# Patient Record
Sex: Female | Born: 1975
Health system: Southern US, Community
[De-identification: ages and names within clinical notes are randomized; demographics above are authoritative.]

## PROBLEM LIST (undated history)

## (undated) ENCOUNTER — Inpatient Hospital Stay: Payer: Self-pay

## (undated) DIAGNOSIS — Z3490 Encounter for supervision of normal pregnancy, unspecified, unspecified trimester: Secondary | ICD-10-CM

## (undated) DIAGNOSIS — Z8489 Family history of other specified conditions: Secondary | ICD-10-CM

## (undated) DIAGNOSIS — F419 Anxiety disorder, unspecified: Secondary | ICD-10-CM

## (undated) DIAGNOSIS — I81 Portal vein thrombosis: Secondary | ICD-10-CM

## (undated) DIAGNOSIS — E039 Hypothyroidism, unspecified: Secondary | ICD-10-CM

## (undated) DIAGNOSIS — G4730 Sleep apnea, unspecified: Secondary | ICD-10-CM

## (undated) DIAGNOSIS — Z98891 History of uterine scar from previous surgery: Secondary | ICD-10-CM

## (undated) DIAGNOSIS — K219 Gastro-esophageal reflux disease without esophagitis: Secondary | ICD-10-CM

## (undated) DIAGNOSIS — E119 Type 2 diabetes mellitus without complications: Secondary | ICD-10-CM

## (undated) DIAGNOSIS — E282 Polycystic ovarian syndrome: Secondary | ICD-10-CM

## (undated) DIAGNOSIS — L409 Psoriasis, unspecified: Secondary | ICD-10-CM

## (undated) DIAGNOSIS — O99210 Obesity complicating pregnancy, unspecified trimester: Secondary | ICD-10-CM

## (undated) DIAGNOSIS — D6852 Prothrombin gene mutation: Secondary | ICD-10-CM

## (undated) DIAGNOSIS — O34219 Maternal care for unspecified type scar from previous cesarean delivery: Secondary | ICD-10-CM

## (undated) DIAGNOSIS — O1495 Unspecified pre-eclampsia, complicating the puerperium: Secondary | ICD-10-CM

## (undated) DIAGNOSIS — D649 Anemia, unspecified: Secondary | ICD-10-CM

## (undated) HISTORY — PX: DILATION AND CURETTAGE OF UTERUS: SHX78

## (undated) HISTORY — PX: DIAGNOSTIC LAPAROSCOPY: SUR761

## (undated) HISTORY — PX: OTHER SURGICAL HISTORY: SHX169

## (undated) HISTORY — DX: Polycystic ovarian syndrome: E28.2

## (undated) HISTORY — DX: Psoriasis, unspecified: L40.9

## (undated) HISTORY — DX: Type 2 diabetes mellitus without complications: E11.9

## (undated) HISTORY — DX: Gastro-esophageal reflux disease without esophagitis: K21.9

## (undated) HISTORY — PX: LAPAROSCOPIC CHOLECYSTECTOMY: SUR755

---
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
## 2009-07-16 DIAGNOSIS — O1495 Unspecified pre-eclampsia, complicating the puerperium: Secondary | ICD-10-CM

## 2012-09-04 ENCOUNTER — Emergency Department: Payer: Self-pay | Admitting: Emergency Medicine

## 2012-09-04 LAB — URINALYSIS, COMPLETE
Glucose,UR: NEGATIVE mg/dL (ref 0–75)
Ketone: NEGATIVE
Nitrite: NEGATIVE
Protein: 30
RBC,UR: 18 /HPF (ref 0–5)
Specific Gravity: 1.025 (ref 1.003–1.030)
WBC UR: 4 /HPF (ref 0–5)

## 2012-09-04 LAB — CBC
HCT: 39.2 % (ref 35.0–47.0)
MCH: 25.3 pg — ABNORMAL LOW (ref 26.0–34.0)
MCV: 78 fL — ABNORMAL LOW (ref 80–100)
Platelet: 369 10*3/uL (ref 150–440)
RBC: 5.04 10*6/uL (ref 3.80–5.20)
WBC: 10.2 10*3/uL (ref 3.6–11.0)

## 2012-09-04 LAB — PROTIME-INR
INR: 0.9
Prothrombin Time: 12.1 secs (ref 11.5–14.7)

## 2012-09-04 LAB — APTT: Activated PTT: 27.6 secs (ref 23.6–35.9)

## 2012-09-04 LAB — HCG, QUANTITATIVE, PREGNANCY: Beta Hcg, Quant.: 12 m[IU]/mL — ABNORMAL HIGH

## 2012-09-22 ENCOUNTER — Ambulatory Visit: Payer: Self-pay | Admitting: Medical

## 2012-10-10 ENCOUNTER — Encounter: Payer: Self-pay | Admitting: Maternal & Fetal Medicine

## 2012-10-20 DIAGNOSIS — E282 Polycystic ovarian syndrome: Secondary | ICD-10-CM | POA: Insufficient documentation

## 2012-10-20 HISTORY — DX: Polycystic ovarian syndrome: E28.2

## 2012-12-29 ENCOUNTER — Encounter: Payer: Self-pay | Admitting: Obstetrics & Gynecology

## 2013-01-10 ENCOUNTER — Encounter: Payer: Self-pay | Admitting: Obstetrics and Gynecology

## 2013-03-02 ENCOUNTER — Ambulatory Visit: Payer: Self-pay | Admitting: Family Medicine

## 2013-03-13 ENCOUNTER — Ambulatory Visit: Payer: Self-pay | Admitting: Family Medicine

## 2013-04-12 ENCOUNTER — Ambulatory Visit: Payer: Self-pay | Admitting: Family Medicine

## 2013-08-24 ENCOUNTER — Ambulatory Visit: Payer: Self-pay | Admitting: Oncology

## 2013-08-31 ENCOUNTER — Encounter: Payer: Self-pay | Admitting: Maternal and Fetal Medicine

## 2013-09-10 ENCOUNTER — Ambulatory Visit: Payer: Self-pay | Admitting: Oncology

## 2013-09-18 ENCOUNTER — Emergency Department: Payer: Self-pay | Admitting: Emergency Medicine

## 2013-09-18 LAB — CBC
HCT: 36.7 % (ref 35.0–47.0)
HGB: 12.3 g/dL (ref 12.0–16.0)
MCH: 26.6 pg (ref 26.0–34.0)
MCHC: 33.6 g/dL (ref 32.0–36.0)
MCV: 79 fL — ABNORMAL LOW (ref 80–100)
PLATELETS: 294 10*3/uL (ref 150–440)
RBC: 4.63 10*6/uL (ref 3.80–5.20)
RDW: 15.7 % — AB (ref 11.5–14.5)
WBC: 7.9 10*3/uL (ref 3.6–11.0)

## 2013-09-18 LAB — HCG, QUANTITATIVE, PREGNANCY: Beta Hcg, Quant.: 23797 m[IU]/mL — ABNORMAL HIGH

## 2013-09-20 ENCOUNTER — Ambulatory Visit: Payer: Self-pay | Admitting: Obstetrics & Gynecology

## 2013-09-21 ENCOUNTER — Ambulatory Visit: Payer: Self-pay | Admitting: Obstetrics & Gynecology

## 2013-09-22 LAB — PATHOLOGY REPORT

## 2013-10-12 ENCOUNTER — Ambulatory Visit: Payer: Self-pay | Admitting: Chiropractor

## 2014-05-16 LAB — OB RESULTS CONSOLE GC/CHLAMYDIA
Chlamydia: NEGATIVE
Gonorrhea: NEGATIVE

## 2014-05-16 LAB — OB RESULTS CONSOLE ABO/RH: RH Type: NEGATIVE

## 2014-05-16 LAB — OB RESULTS CONSOLE RUBELLA ANTIBODY, IGM: Rubella: IMMUNE

## 2014-05-16 LAB — OB RESULTS CONSOLE HIV ANTIBODY (ROUTINE TESTING): HIV: NONREACTIVE

## 2014-05-16 LAB — OB RESULTS CONSOLE RPR: RPR: NONREACTIVE

## 2014-05-16 LAB — OB RESULTS CONSOLE ANTIBODY SCREEN: Antibody Screen: NEGATIVE

## 2014-05-16 LAB — OB RESULTS CONSOLE HEPATITIS B SURFACE ANTIGEN: HEP B S AG: NEGATIVE

## 2014-05-16 LAB — OB RESULTS CONSOLE VARICELLA ZOSTER ANTIBODY, IGG: Varicella: IMMUNE

## 2014-05-24 ENCOUNTER — Encounter: Payer: Self-pay | Admitting: Obstetrics and Gynecology

## 2014-07-09 ENCOUNTER — Encounter: Payer: Self-pay | Admitting: Obstetrics and Gynecology

## 2014-07-13 HISTORY — DX: Maternal care for unspecified type scar from previous cesarean delivery: O34.219

## 2014-09-04 ENCOUNTER — Observation Stay: Payer: Self-pay | Admitting: Obstetrics & Gynecology

## 2014-11-02 NOTE — Consult Note (Signed)
Referral Information:  Reason for Referral Preconception Counseling. History of portal vein thrombsis in setting of Prothrombin Gene mutation carrier   Referring Physician [facility]   Prenatal Hx Joanna BucklerHeidi is a 39 year-old G2 P1011 who is here for a preconceptual counselign visit. In 2006 she was diagnosed with a portal vein thrombosis. At the time she was on continuous OCPs and also smoking. She was placed on heparin and then completed 8 months of coumadin therapy.  She underwent a workup for inherited and acquired thrombophilias and was found to be a carrier for the prothrombin gene mutation. All other testing was normal.  She became pregnant in 2011 and was maintained on prophylactic doing of Lovenox (60 mg once daily) and then transition to unfractionated heparin (10,000 units every 12 hours). At 38 weeks during routine fetal testing she was found to have oligohyramnios. She underwent an induction of labor which was complicated by non-reassuring fetal status that required a cesareand delivery. She then was placed on Lovenox for six week postpartum. She was readmitted one week postpartum with preeclampsia and stayed in the hosptial 3 days.  She never had any bleeding complications and did well. She was living in [place] and was followed by the [facility], [place].  [facility]. She has PCOS and is planning for clomid induction.  She has no complaints.   Past Obstetrical Hx G3 P1011 2011: IOL at 38 weeks for oligo. cesarean for non-reassuring fetal status. Female fetus, 6 pounds 3 ounses. On propulactic anticoagulation in the pregnancy (see HPI). Readmitted postpartum with preeclampsai 2014: SAB at 5 weeks. No cardiac activity. No D&C required   Home Medications: Medication Instructions Status  Protonix 40 mg oral delayed release tablet 1 tab(s) orally once a day Active  Prenatal 1 Prenatal Multivitamins with Folic  Acid 0.975 mg oral capsule 1 cap(s) orally once a day Active   Allergies:   No Known Allergies:   Vital Signs/Notes:  Nursing Vital Signs: **Vital Signs.:   19-Jun-14 13:15  Pulse Pulse 83  Systolic BP Systolic BP 96  Diastolic BP (mmHg) Diastolic BP (mmHg) 82   Perinatal Consult:  Past Medical History cont'd Carrier of the Prothrombin gene mutation. States remainder of thrombophilia workup was negative Portal vein thrombosis in 2006 PCOS   PSurg Hx Cesarean delivery 2011. Diagnostic laparscopy in 2001 for pelvic pain. Laparscopic cholecystectomy   FHx Denies a FH of birth defects, genetic disorders, or mental retardation. Maternal GM with DVT at age 39. 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   Fever/Chills No   Abdominal Pain No   Nausea/Vomiting No   SOB/DOE No   Chest Pain No   Tolerating Diet Yes   Medications/Allergies Reviewed Medications/Allergies reviewed  as above    Additional Lab/Radiology Notes Outside labs [facility] 10/07/09: ATIII activity 106%, Protein S activity 122%,  10/07/09: Lupus anticoagulant negative,  10/07/09: Homocystein level normal (7.7)  [facility] Progress Notes: State heterozygote carrier of prothrombin gene mutation.  States negative Factor V Leiden carrier and Normal Protein C and S levels.   Impression/Recommendations:  Impression 39 year-old G2 P1011 here for preconception counseling for history of portal vein thrombosis and heterozygote carrier for the prothrombin gene mutation. She has had  a successful pregnancy on prophylactic dosing of Lovenox.   Recommendations 1. Advanced Maternal Age. We discussed age-associated risk for aneuploidy and availability of genetic counseling and aneuploidy screening and  diagnostic tests that are available. We also discussed risks of medical complications of pregnancy.  2. History of prior cesarean delivery.  We discussed risks and benefits of both a trial of labor and repeat cesarean. She will further discuss with [facility]   3. Prothrombin gene mutation (heterozygote carrier) with history of portal vein thrombosis. We discussed risks of recurrent thromboembolic phenomena during pregnancy. She has done well in a previous pregnancy on prophylactic anticoagulation. We discussed risks and benefits of anticoagulation in pregnancy. We repeated antiphospholipid antibody testing today as these can chage. If negative, will recommned prophylactic dosing of Lovenox in pregnancy (see below). If positive, she will need full-dose anticoagulation.  Rec: -Referral to genetic counseling for Advanced Maternal Age once pregnatnt -Initiate Lovenox 30 mg daily when becomes pregnant, increasging to 30 mg every 12 hours with documentation of live intrauterine pregnancy. Increase to 40 mg every 12 hours at 28 weeks. At 36 weeks, transition to unfractionalted heparin 10,000 units every 12 hours. Deliver at 39 weeks, having held heparin the nigth prior and morning of delivery -SCDs on in labor or during cesarean -Fetal surveiallance: Detailed ultrasound at approx 18 weeks then ultraosund for growth at approximately 28 and 34 weeks. Twice weekly fetal testing starting at 32 weeks -Continue prenatal vitamins -We discussed use of daily baby aspirin for preeclampsia prevention starting at 12 weeks.    Total Time Spent with Patient 60 minutes   >50% of visit spent in couseling/coordination of care yes   Office Use Only 99244  Level 4 ( ) NEW office consult low complexity   Coding Description: MATERNAL CONDITIONS/HISTORY INDICATION(S).   Adv. Maternal Age - multigravida.   Previous C-section.   Primary hypercoagulable state.  Electronic Signatures: Ravenna Legore, [place] (MD)   (Signed 19-Jun-14 15:58)  Authored: Referral, Home Medications, Allergies, Vital Signs/Notes, Consult, Exam, Lab/Radiology Notes, Impression, Billing, Coding Description   Last Updated: 19-Jun-14 15:58 by Moreen Piggott, [place] (MD)

## 2014-11-03 NOTE — Op Note (Signed)
PATIENT NAME:  Joanna Scott, Joanna Scott MR#:  [id number] DATE OF BIRTH:  1976/04/18  DATE OF PROCEDURE:  09/21/2013  PREOPERATIVE DIAGNOSIS: Missed abortion.  POSTOPERATIVE DIAGNOSIS: Missed abortion.   PROCEDURE: Suction dilatation and curettage.   SURGEON: Annamarie MajorPaul Harris, MD   ANESTHESIA: General.   ESTIMATED BLOOD LOSS: Minimal.   COMPLICATIONS: None.   FINDINGS/SPECIMEN: Products of conception.   DISPOSITION: To recovery room in stable condition.   TECHNIQUE: The patient is prepped and draped in the usual sterile fashion. After adequate anesthesia is obtained in the dorsal lithotomy, the bladder was entered with a Robinson catheter. Speculum was placed and the anterior lip of the cervix was grasped with a tenaculum and the cervix was dilated to about a 20 Pratt dilator. Gentle curettage was performed with a banjo curette with tissue sent for genetic studies. A suction curettage was then performed with 8 mm rigid curette with aspiration of products of conception and sent to pathology for further review. Repeat gentle curettage was performed. Excellent hemostasis was noted. The patient goes to recovery in stable condition. All sponge, instrument and needle counts were correct.   ____________________________ R. Annamarie MajorPaul Harris, MD rph:aw D: 09/21/2013 09:48:32 ET T: 09/21/2013 10:09:50 ET JOB#: [id number]  cc: Dierdre Searles. Paul Harris, MD, <Dictator> Nadara MustardOBERT P HARRIS MD ELECTRONICALLY SIGNED 09/21/2013 20:19

## 2014-11-03 NOTE — Consult Note (Signed)
Referral Information:  Reason for Referral History of portal vein thrombosis now pregnant Patient saw Dr. Leone BrandGrotegut for a prepregnancy consult on 12/28/2012 - please see that note for complete history and care plan   Referring Physician [facility]   Prenatal Hx LMP 07/12/2013 (should be 7 1/7 weeks today), but she has irregular cycles. Patient has had spotting. For this she had an ultrasound today that showed a gestational sac only. She has a follow up ultrasound scheduled for 2 weeks   Past Obstetrical Hx Cesarean delivery 2011. Patient was on enoxaparin 60 mg daily   Home Medications: Medication Instructions Status  enoxaparin 40 mg/0.4 mL injectable solution 40 milligram(s) injectable once a day Active  Protonix 40 mg oral delayed release tablet 1 tab(s) orally once a day Active  Prenatal 1 Prenatal Multivitamins with Folic Acid 0.975 mg oral capsule 1 cap(s) orally once a day Active  Zoloft 50 mg oral tablet 1 tab(s) orally once a day Active   Allergies:   No Known Allergies:   Vital Signs/Notes:  Nursing Vital Signs: **Vital Signs.:   19-Feb-15 13:23  Vital Signs Type Admission  Temperature Temperature (F) 99.1  Celsius 37.2  Temperature Source oral  Pulse Pulse 92  Respirations Respirations 18  Systolic BP Systolic BP 125  Diastolic BP (mmHg) Diastolic BP (mmHg) 85  Mean BP 98  Pulse Ox % Pulse Ox % 97  Pulse Ox Activity Level  At rest  Oxygen Delivery Room Air/ 21 %   Perinatal Consult:  Past Medical History cont'd Carrier of the Prothrombin gene mutation. Work up for other inherited thrombophilias -- negative. Work up for aquired thrombophilias -- negative. Portal vein thrombosis in 2006 PCOS   PSurg Hx Cesarean delivery 2011. Diagnostic laparscopy in 2001 for pelvic pain. Laparscopic cholecystectomy   FHx Denies a FH of birth defects, genetic disorders, or mental retardation. Maternal GM with DVT at age 39. 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 [employer]   Soc Hx married, Denies ETOH, tobaccor or drugs   Impression/Recommendations:  Impression 301) 39 year-old G3 P1011 2) History of portal vein thromobosis (on OCPs) 3) Protrhombin gene mutation heterozygote 4) Previous successful pregnancy on mid dosing enoxaparin 5) Currently using enoxaparin 40 mg daily 6) Older maternal age   Recommendations 1) Increase enoxaparin to 40 mg twice daily 2) Agree with follow up ultrasound 3) Assuming ultrasound shows viable pregnancy, start low dose aspirin daily 4) Return in 5 weeks for   a) follow up MFM visit   b) Genetic counseling for AMA   c) Possible NT screening    Total Time Spent with Patient 15 minutes   >50% of visit spent in couseling/coordination of care yes   Office Use Only 99244  Level 4 (60min) NEW office consult low complexity, 99213  Office Visit Level 3 (15min) EST exp prob focused outpt   Coding Description: MATERNAL CONDITIONS/HISTORY INDICATION(S).   Adv. Maternal Age - multigravida.   Previous C-section.   Primary hypercoagulable state.  Electronic Signatures: Marcelino ScotBrancazio, Bret Vanessen (MD)  (Signed 19-Feb-15 14:29)  Authored: Referral, Home Medications, Allergies, Vital Signs/Notes, Consult, Impression, Billing, Coding Description   Last Updated: 19-Feb-15 14:29 by Marcelino ScotBrancazio, Symon Norwood (MD)

## 2014-11-03 NOTE — Consult Note (Signed)
Referral Information:  Reason for Referral [name] is a 39yo [id number] at 4777w2d with an EDC of 01/02/2015 based on a 1646w0d [facility].  She has a history of portal vein thrombosis in 2006 and was subsequently diagnosed with prothrombin gene mutation (heterozygote).  She presents for recommendations regarding management in this pregnancy. Patient saw Dr. Leone Scott for a prepregnancy consult on 12/28/2012 - please see that note for complete history and care plan   Referring Physician [facility]   Prenatal Hx Joanna Scott is currently on 40mg  BID of Lovenox.  She was on 60mg  daily during her first pregnancy; since then she has had follow up antiphospholipid testing which was negative and [facility] and was recommended to be on 40mg  BID.   Past Obstetrical Hx 1.  Cesarean delivery 2011 after IOL for oligohydramnios (female, 6#3oz). Patient was on enoxaparin 60 mg daily.  Readmitted for postpartum preeclampsia.  Remained on daily Lovenox for about 6 weeks PP. 2.  08/2012 chemical pregnancy 3.  08/2013 9 week loss; D&C; diagnosed Trisomy 16 4.  03/2014 chemical pregnancy 5.  current   Home Medications:  Medication Instructions Status  enoxaparin 40 mg/0.4 mL injectable solution 40 milligram(s) injectable 2 times a day Active  Prenatal Multivitamins Prenatal Multivitamins oral tablet 1 tab(s) orally once a day Active  folic acid 1 mg oral tablet 1 tab(s) orally once a day Active  Protonix 40 mg oral delayed release tablet 1 tab(s) orally once a day Active  Zoloft 50 mg oral tablet 1 tab(s) orally once a day Active   Allergies:   No Known Allergies:   Vital Signs/Notes:  Nursing Vital Signs:  **Vital Signs.:   12-Nov-15 13:21  Vital Signs Type Routine  Temperature Temperature (F) 98.4  Celsius 36.8  Pulse Pulse 82  Respirations Respirations 18  Systolic BP Systolic BP 126  Diastolic BP (mmHg) Diastolic BP (mmHg) 76  Mean BP 92  Pulse Ox  % Pulse Ox % 99  Pulse Ox Activity Level  At rest  Oxygen Delivery Room Air/ 21 %   Perinatal Consult:  PGyn Hx History of PCOS/irregular periods; denies abnormal PAPs or STDs    PMed Hx Hx of varicella   Past Medical History cont'd 1.  Portal vein thrombosis in 2006 while on OCPs - presented with severe abdominal pain and was ultimately diagnosed after CT performed [facility]) 2.  Carrier of the Prothrombin gene mutation. Work up for other inherited thrombophilias -- negative. Work up for acquired thrombophilias -- negative  3.  PCOS   PSurg Hx Cesarean delivery 2011. Diagnostic laparoscopy in 2001 for pelvic pain. Laparoscopic cholecystectomy.   FHx Denies a FH of birth defects, genetic disorders, or mental retardation. Maternal GM with DVT at age 39. PGM with stroke in his 2460's.   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 1) History of portal vein thromobosis (on OCPs) 2) Prothrombin gene mutation heterozygote 3) Previous successful  pregnancy on mid dosing enoxaparin 4) Currently using enoxaparin 40 mg BID 5) Older maternal age 44) History of preeclampsia postpartum 7) PCOS/elevated BMI   Recommendations 1) Continue Lovenox 40 mg twice daily - no dose adjustment required.  Recommend continuing this until 24hr prior to repeat c/s or scheduled IOL.  Resume Lovenox  daily PP either 12hrs after vaginal delivery or 24hrs post cesarean delivery.  SCDs while immobile.  Joanna Scott is strongly considering a repeat c/s given the urgency of her last  delivery and wanting to be able to plan this one more. 2) Offered genetic counseling -planning on having cffDNA drawn at the office and first trimester [place]. 3) Start low dose ASA after 12 weeks.  Consider baseline CMP and p/c ratio.  4) Consider early glucola given elevated BMI and history of PCOS. 5) Offer detailed ultrasound at 18 weeks and montly after 28 weeks with antenatal testing to start between 34-36 weeks. Delivery around 39 weeks. 6) We would be more than happy to see Joanna Scott back at any time during the pregnancy.   Plan:  Ultrasound at what gestational ages Monthly > 28 weeks   Antepartum Testing Starting at 57 to 36 weeks    Total Time Spent with Patient 30 minutes   >50% of visit spent in couseling/coordination of care yes   Office Use Only 99243  Level 3 ( ) NEW office consult detailed   Coding Description: MATERNAL CONDITIONS/HISTORY INDICATION(S).   Adv. Maternal Age - multigravida.   Coagulation defects and/or Pt on heparin/coumadin/lovenox.  Electronic Signatures: Kirby Funk (MD)  (Signed [phone] 14:29)  Authored: Referral, Home Medications, Allergies, Vital Signs/Notes, Consult, Exam, Impression, Plan, Billing, Coding Description   Last Updated: 12-Nov-15 14:29 by Kirby Funk (MD)

## 2014-11-20 NOTE — H&P (Signed)
L&D Evaluation:  History:  HPI 38yo [id number] @ 22.6 by 1st trimester US with an EDC of 01/02/15 presents after a fall.  She was holding her son, and tripped over another child and acrobatically fell without hitting her abdomen.  Her injuries are to her hip, shoulder, and knee.  She does feel some abdominal pain, however describes it as external/MSK.  No LOF, VB, CTX +FM  Pregnancy issues: Prothrombin gene mutation, on lovenox and 81mg  ASA AMA anxiety, on zoloft 50mg  history of primary C/S, cosidering VBAC Rh neg Obese, BMI 38  Oneg / Ab neg / RPR NR / HIV neg / Hep B neg / VI / RI   Presents with s/p fall   Patient's Medical History Obesity, Prothrombin gene mutation, anxiety   Patient's Surgical History dx lap, lap chole, c/s   Medications Pre Natal Vitamins  lovenox 40mg  BID, Protonix   Allergies NKDA   Social History none   Family History strokes in family members   ROS:  ROS All systems were reviewed.  HEENT, CNS, GI, GU, Respiratory, CV, Renal and Musculoskeletal systems were found to be normal.   Exam:  Vital Signs stable   Urine Protein not completed   General no apparent distress   Mental Status clear   Chest clear   Heart normal sinus rhythm   Abdomen gravid, non-tender   Estimated Fetal Weight Average for gestational age   Fetal Position cephalic via ultrasound   Back no CVAT   Edema no edema   Reflexes 2+   Clonus negative   Mebranes Intact   FHT Description present   Ucx absent   Impression:  Impression s/p fall   Plan:  Comments 38yo [id number] @ 22.6 s/p fall without trauma to abdomen.  1. ultrasound performed with no evidence of placental disruption 2. fetal hearts present 3. encouraged tylenol, ice and rest to areas where there are discomfort, call if worsening pain 4. discharge home, keep next appointment.   Follow Up Appointment already scheduled   Electronic Signatures: Ward, Elenora Fenderhelsea C (MD)  (Signed 23-Feb-16  13:09)  Authored: L&D Evaluation   Last Updated: 23-Feb-16 13:09 by Ward, Elenora Fenderhelsea C (MD)

## 2014-12-16 ENCOUNTER — Observation Stay
Admission: EM | Admit: 2014-12-16 | Discharge: 2014-12-16 | Disposition: A | Discharge status: Home / Self Care | Payer: BLUE CROSS/BLUE SHIELD | Source: Home / Self Care | Admitting: Obstetrics & Gynecology

## 2014-12-16 DIAGNOSIS — O34219 Maternal care for unspecified type scar from previous cesarean delivery: Secondary | ICD-10-CM

## 2014-12-16 DIAGNOSIS — F419 Anxiety disorder, unspecified: Secondary | ICD-10-CM | POA: Insufficient documentation

## 2014-12-16 DIAGNOSIS — O3421 Maternal care for scar from previous cesarean delivery: Secondary | ICD-10-CM | POA: Insufficient documentation

## 2014-12-16 DIAGNOSIS — O99213 Obesity complicating pregnancy, third trimester: Secondary | ICD-10-CM

## 2014-12-16 DIAGNOSIS — Z6838 Body mass index (BMI) 38.0-38.9, adult: Secondary | ICD-10-CM | POA: Insufficient documentation

## 2014-12-16 DIAGNOSIS — O09523 Supervision of elderly multigravida, third trimester: Secondary | ICD-10-CM

## 2014-12-16 DIAGNOSIS — Z3A37 37 weeks gestation of pregnancy: Secondary | ICD-10-CM | POA: Insufficient documentation

## 2014-12-16 DIAGNOSIS — O99013 Anemia complicating pregnancy, third trimester: Secondary | ICD-10-CM | POA: Insufficient documentation

## 2014-12-16 DIAGNOSIS — Z3490 Encounter for supervision of normal pregnancy, unspecified, unspecified trimester: Secondary | ICD-10-CM

## 2014-12-16 DIAGNOSIS — O99820 Streptococcus B carrier state complicating pregnancy: Secondary | ICD-10-CM

## 2014-12-16 DIAGNOSIS — O26893 Other specified pregnancy related conditions, third trimester: Secondary | ICD-10-CM

## 2014-12-16 HISTORY — DX: Portal vein thrombosis: I81

## 2014-12-16 HISTORY — DX: Encounter for supervision of normal pregnancy, unspecified, unspecified trimester: Z34.90

## 2014-12-16 HISTORY — DX: Unspecified pre-eclampsia, complicating the puerperium: O14.95

## 2014-12-16 HISTORY — DX: Prothrombin gene mutation: D68.52

## 2014-12-16 HISTORY — DX: Obesity complicating pregnancy, unspecified trimester: O99.210

## 2014-12-16 HISTORY — DX: History of uterine scar from previous surgery: Z98.891

## 2014-12-16 HISTORY — DX: Anxiety disorder, unspecified: F41.9

## 2014-12-16 HISTORY — DX: Family history of other specified conditions: Z84.89

## 2014-12-16 HISTORY — DX: Anemia, unspecified: D64.9

## 2014-12-16 LAB — CBC
HCT: 28 % — ABNORMAL LOW (ref 35.0–47.0)
Hemoglobin: 8.7 g/dL — ABNORMAL LOW (ref 12.0–16.0)
MCH: 22.6 pg — ABNORMAL LOW (ref 26.0–34.0)
MCHC: 31.1 g/dL — ABNORMAL LOW (ref 32.0–36.0)
MCV: 72.7 fL — ABNORMAL LOW (ref 80.0–100.0)
PLATELETS: 160 10*3/uL (ref 150–440)
RBC: 3.86 MIL/uL (ref 3.80–5.20)
RDW: 18.1 % — ABNORMAL HIGH (ref 11.5–14.5)
WBC: 11.8 10*3/uL — AB (ref 3.6–11.0)

## 2014-12-16 LAB — APTT: aPTT: 25 seconds (ref 24–36)

## 2014-12-16 LAB — PROTIME-INR
INR: 0.89
Prothrombin Time: 12.2 seconds (ref 11.4–15.0)

## 2014-12-16 LAB — ABO/RH: ABO/RH(D): O NEG

## 2014-12-16 MED ORDER — LACTATED RINGERS IV SOLN
INTRAVENOUS | Status: DC
  Administered 2014-12-16: 999 mL/h via INTRAVENOUS
  Administered 2014-12-16: 07:00:00 via INTRAVENOUS

## 2014-12-16 MED ORDER — LACTATED RINGERS IV SOLN: 500.0000 mL | INTRAVENOUS | Status: DC | PRN

## 2014-12-16 MED ORDER — SODIUM CHLORIDE 0.9 % IJ SOLN
INTRAMUSCULAR | Status: AC
  Administered 2014-12-16: 05:00:00
  Filled 2014-12-16: qty 3

## 2014-12-16 NOTE — Progress Notes (Signed)
S: Still feeling contractions, but they have spaced out with IV hydration. Some difficulty getting IV access. O: BP 129/85, 118/74 FHR 135-140 with accels, mod variability, no decelerations Toco: irregular contractions Cx: FT/60%/-1 to -2 Recent Results (from the past 2160 hour(s))  APTT     Status: None   Collection Time: 12/16/14  5:41 AM  Result Value Ref Range   aPTT 25 24 - 36 seconds  Protime-INR     Status: None   Collection Time: 12/16/14  5:41 AM  Result Value Ref Range   Prothrombin Time 12.2 11.4 - 15.0 seconds   INR 0.89   Type and screen     Status: None   Collection Time: 12/16/14  5:41 AM  Result Value Ref Range   ABO/RH(D) O NEG    Antibody Screen NEG    Sample Expiration 12/19/2014   CBC     Status: Abnormal   Collection Time: 12/16/14  5:41 AM  Result Value Ref Range   WBC 11.8 (H) 3.6 - 11.0 K/uL   RBC 3.86 3.80 - 5.20 MIL/uL   Hemoglobin 8.7 (L) 12.0 - 16.0 g/dL   HCT 09.828.0 (L) 11.935.0 - 14.747.0 %   MCV 72.7 (L) 80.0 - 100.0 fL   MCH 22.6 (L) 26.0 - 34.0 pg   MCHC 31.1 (L) 32.0 - 36.0 g/dL   RDW 82.918.1 (H) 56.211.5 - 13.014.5 %   Platelets 160 150 - 440 K/uL  ABO/Rh     Status: None   Collection Time: 12/16/14  5:41 AM  Result Value Ref Range   ABO/RH(D) O NEG    A: some mild cx change  Anemic P: Called report to Dr Elesa MassedWard, who will be in to evaluate patient. Keep NPO for now Continue to monitor for contractions and  cx change. Farrel ConnersGUTIERREZ, Yosselyn Tax, CNM

## 2014-12-16 NOTE — OB Triage Provider Note (Addendum)
Triage Evaluation  Patient is a 38yo [id number] @ 37.4 with history of cesarean for failed induction.  She presents to triage with complaints of contractions  No LOF VB +FM  Initial evaluation: Cervix long, closed, high  Final evalation:  Cervix 1.5cm, long, high  FHT: 135 mod +accels no decels Toco: irregular   A/P: 38yo G5P1031 @ 37.4 with early labor 1. Patient's contractions eased after administration of IV fluids, and was able to rest.  She is in early labor, and likely will need cesarean soon but is able to wait some time before this declaration.  She is able to go home and get her things in order, including arranging child care for when she is in labor.  Signs to look for were discussed in detail.  Patient to return when contractions are persistent.   2. H/o preeclampsia: BP WNL. 3. Thrombosis risk: restart Lovenox this morning and hold if contractions get progressively worse or more frequent.   Ranae Plumberhelsea Luretta Everly, MD Attending Obstetrician and Gynecologist [facility]

## 2014-12-16 NOTE — OB Triage Provider Note (Signed)
OB History & Physical   History of Present Illness:  Chief Complaint: Contractions off and on yesterday, becoming stronger and every 6-8 minutes at 2330 last night. HPI:  Joanna Scott is a 39 y.o. [id number] female at 4137 4/7 weeks dated by a 7 week ultrasound.  Her pregnancy has been complicated by AMA, anxiety (on Zoloft 50 mgm daily), BMI=38, a prothrombin gene mutation (Factor II) and is on Lovenox 40 mgm prophyllaxis, and a hx of a previous Cesarean section for FTP..  She presents to L&D for evaluation of contractions. She currently is scheduled for a repeat CS on 6/16. She held her PM dose of Lovenox because of her contractions (LD was 0900 6/4). She last ate solid food at 1800 and has been drinking only water since then. She also reports having a blood tinged mucoid discharge. She reports good fetal movement.   Prenatal care site: [facility] with [facility] has also been remarkable for receiving Rhogam at 28 weeks (O neg), a normal Informaseq and anatomy scan, an 8# weight gain with pregnancy, anemia (hmg=10gm/dl), and  normal antepartum testing. Last growth scan at 34.5 weeks with EFW=6#12oz (80.9%) and last AFI 5/31=7.49cm.       Maternal Medical History:   Past Medical History  Diagnosis Date  . Family history of malignant hyperthermia   . Anemia   . Anxiety   . Preeclampsia in postpartum period   . Portal vein thrombosis   . Prothrombin gene mutation   . Obesity affecting pregnancy   . Pregnancy   . Previous cesarean section     Past Surgical History  Procedure Laterality Date  . Cesarean section    . Diagnostic laparoscopy    . Laparoscopic cholecystectomy    . Dilation and curettage of uterus      No Known Allergies  Prior to Admission medications   Medication Sig Start Date End Date Taking? Authorizing Provider  enoxaparin (LOVENOX) 40 MG/0.4ML injection Inject 40 mg into the skin every 12 (twelve) hours. 12/15/14 12/16/14 Yes Historical  Provider, MD  ferrous sulfate 325 (65 FE) MG tablet Take 325 mg by mouth daily with breakfast.   Yes Historical Provider, MD  folic acid (FOLVITE) 1 MG tablet Take 1 mg by mouth daily.   Yes Historical Provider, MD  pantoprazole (PROTONIX) 40 MG tablet Take 40 mg by mouth daily.   Yes Historical Provider, MD  Prenatal Vit-Fe Fumarate-FA (PRENATAL MULTIVITAMIN) TABS tablet Take 1 tablet by mouth daily at 12 noon.   Yes Historical Provider, MD  sertraline (ZOLOFT) 50 MG tablet Take 50 mg by mouth daily.   Yes Historical Provider, MD          Social History: She  reports that she has never smoked. She has never used smokeless tobacco. She reports that she does not drink alcohol or use illicit drugs.  Family History: family history includes Deep vein thrombosis in her maternal grandmother; Malignant hyperthermia in her cousin; Stroke in her paternal grandmother.   Review of Systems: Negative x 10 systems reviewed except as noted in the HPI.      Physical Exam:  Vital Signs: Ht 5\' 3"  (1.6 m)  Wt 102.059 kg (225 lb)  BMI 39.87 kg/m2 142/83 General: no acute distress.  HEENT: normocephalic, atraumatic Heart: regular rate & rhythm.  No murmurs/rubs/gallops Lungs: clear to auscultation bilaterally Abdomen: soft, gravid, non-tender;  EFW: 7 1/2# Pelvic:   External: Normal external female genitalia  Cervix: closed/50-75%/-2/vtx on  Leopold's, small amt blood tinged mucus on glove  Wet Mount:   ROM:  Extremities: non-tender, symmetric, trace edema bilaterally.  DTRs: +1  Neurologic: Alert & oriented x 3.    Pertinent Results:  Prenatal Labs: Blood type/Rh O negative  Antibody screen negative  Rubella immune  RPR nonreactive  HBsAg negative  HIV negative  GC negative  Chlamydia negative  Genetic screening Negative Informaseq  1 hour GTT 115  3 hour GTT NA  GBS positive   Baseline FHR: 130s baseline   Variability: moderate variability    Accelerations: present to 150s     Decelerations: absent Contractions: present frequency: q4-12min apart Overall assessment: Reactive strip- Cat1   Bedside Ultrasound:  Number of Fetus:   Presentation:   Fluid:   Placental Location:   Assessment:  Joanna Scott is a 39 y.o. [id number] female at 37.4 weeks with contractions. R/O labor Previous Cesarean section On Lovenox for prothrombin gene mutation (last dose 9AM 6/4)  Plan:  1. Admit for observation to Labor & Delivery  2. CBC, T&S, RPR, NPO,  IVF 3. GBS positive-will hold antibiotics for now.   4. Monitor for contractions and ervical change 5. Will notify Dr Elesa Massed of admission and any changes  Joanna Scott  12/16/2014 4:10 AM

## 2014-12-16 NOTE — Progress Notes (Signed)
Pt discharged home.  Discharge instructions, follow up appoinments and when to return to [facility] given to and reviewed with pt.  Pt verbalized understanding. Ambulated out with Husband.

## 2014-12-16 NOTE — OB Triage Note (Signed)
C/O contractions since 1200 midnight.  + Spotting and lose of mucous plug.   Denies ROM.    + FM

## 2014-12-16 NOTE — Discharge Instructions (Signed)
Cesarean Delivery  Cesarean delivery is the birth of a baby through a cut (incision) in the abdomen and womb (uterus).  LET YOUR HEALTH CARE PROVIDER KNOW ABOUT:  All medicines you are taking, including vitamins, herbs, eye drops, creams, and over-the-counter medicines.  Previous problems you or members of your family have had with the use of anesthetics.  Any blood disorders you have.  Previous surgeries you have had.  Medical conditions you have.  Any allergies you have.  Complicationsinvolving the pregnancy. RISKS AND COMPLICATIONS  Generally, this is a safe procedure. However, as with any procedure, complications can occur. Possible complications include:  Bleeding.  Infection.  Blood clots.  Injury to surrounding organs.  Problems with anesthesia.  Injury to the baby. BEFORE THE PROCEDURE   You may be given an antacid medicine to drink. This will prevent acid contents in your stomach from going into your lungs if you vomit during the surgery.  You may be given an antibiotic medicine to prevent infection. PROCEDURE   Hair may be removed from your pubic area and your lower abdomen. This is to prevent infection in the incision site.  A tube (Foley catheter) will be placed in your bladder to drain your urine from your bladder into a bag. This keeps your bladder empty during surgery.  An IV tube will be placed in your vein.  You may be given medicine to numb the lower half of your body (regional anesthetic). If you were in labor, you may have already had an epidural in place which can be used in both labor and cesarean delivery. You may possibly be given medicine to make you sleep (general anesthetic) though this is not as common.  An incision will be made in your abdomen that extends to your uterus. There are 2 basic kinds of incisions:  The horizontal (transverse) incision. Horizontal incisions are from side to side and are used for most routine cesarean  deliveries.  The vertical incision. The vertical incision is from the top of the abdomen to the bottom and is less commonly used. It is often done for women who have a serious complication (extreme prematurity) or under emergency situations.  The horizontal and vertical incisions may both be used at the same time. However, this is very uncommon.  An incision is then made in your uterus to deliver the baby.  Your baby will then be delivered.  Both incisions are then closed with absorbable stitches. AFTER THE PROCEDURE   If you were awake during the surgery, you will see your baby right away. If you were asleep, you will see your baby as soon as you are awake.  You may breastfeed your baby after surgery.  You may be able to get up and walk the same day as the surgery. If you need to stay in bed for a period of time, you will receive help to turn, cough, and take deep breaths after surgery. This helps prevent lung problems such as pneumonia.  Do not get out of bed alone the first time after surgery. You will need help getting out of bed until you are able to do this by yourself.  You may be able to shower the day after your cesarean delivery. After the bandage (dressing) is taken off the incision site, a nurse will assist you to shower if you would like help.  You will have pneumatic compression hose placed on your lower legs. This is done to prevent blood clots. When you are up   and walking regularly, they will no longer be necessary.  Do not cross your legs when you sit.  Save any blood clots that you pass. If you pass a clot while on the toilet, do not flush it. Call for the nurse. Tell the nurse if you think you are bleeding too much or passing too many clots.  You will be given medicine as needed. Let your health care providers know if you are hurting. You may also be given an antibiotic to prevent an infection.  Your IV tube will be taken out when you are drinking a reasonable  amount of fluids. The Foley catheter is taken out when you are up and walking.  If your blood type is Rh negative and your baby's blood type is Rh positive, you will be given a shot of anti-D immune globulin. This shot prevents you from having Rh problems with a future pregnancy. You should get the shot even if you had your tubes tied (tubal ligation).  If you are allowed to take the baby for a walk, place the baby in the bassinet and push it. Do not carry your baby in your arms. Document Released: 06/29/2005 Document Revised: 04/19/2013 Document Reviewed: 01/18/2013 Exit[employer] Patient Information 2015 [employer]. This information is not intended to replace advice given to you by your health care provider. Make sure you discuss any questions you have with your health care provider.  

## 2014-12-17 ENCOUNTER — Inpatient Hospital Stay: Payer: BLUE CROSS/BLUE SHIELD | Admitting: Registered Nurse

## 2014-12-17 ENCOUNTER — Inpatient Hospital Stay
Admission: EM | Admit: 2014-12-17 | Discharge: 2014-12-19 | DRG: 765 | Disposition: A | Discharge status: Home / Self Care | Payer: BLUE CROSS/BLUE SHIELD | Attending: Obstetrics and Gynecology | Admitting: Obstetrics and Gynecology

## 2014-12-17 ENCOUNTER — Encounter: Payer: Self-pay | Admitting: Advanced Practice Midwife

## 2014-12-17 ENCOUNTER — Encounter
Admission: EM | Disposition: A | Discharge status: Home / Self Care | Payer: Self-pay | Source: Home / Self Care | Attending: Obstetrics and Gynecology

## 2014-12-17 DIAGNOSIS — D6852 Prothrombin gene mutation: Secondary | ICD-10-CM | POA: Diagnosis present

## 2014-12-17 DIAGNOSIS — Z3A37 37 weeks gestation of pregnancy: Secondary | ICD-10-CM | POA: Diagnosis present

## 2014-12-17 DIAGNOSIS — O9902 Anemia complicating childbirth: Secondary | ICD-10-CM | POA: Diagnosis present

## 2014-12-17 DIAGNOSIS — O3421 Maternal care for scar from previous cesarean delivery: Secondary | ICD-10-CM | POA: Diagnosis present

## 2014-12-17 DIAGNOSIS — Z98891 History of uterine scar from previous surgery: Secondary | ICD-10-CM

## 2014-12-17 DIAGNOSIS — O09523 Supervision of elderly multigravida, third trimester: Secondary | ICD-10-CM

## 2014-12-17 DIAGNOSIS — F419 Anxiety disorder, unspecified: Secondary | ICD-10-CM | POA: Diagnosis present

## 2014-12-17 DIAGNOSIS — K660 Peritoneal adhesions (postprocedural) (postinfection): Secondary | ICD-10-CM | POA: Diagnosis present

## 2014-12-17 DIAGNOSIS — Z7901 Long term (current) use of anticoagulants: Secondary | ICD-10-CM

## 2014-12-17 DIAGNOSIS — O9912 Other diseases of the blood and blood-forming organs and certain disorders involving the immune mechanism complicating childbirth: Secondary | ICD-10-CM | POA: Diagnosis present

## 2014-12-17 HISTORY — DX: History of uterine scar from previous surgery: Z98.891

## 2014-12-17 LAB — TYPE AND SCREEN
ABO/RH(D): O NEG
Antibody Screen: NEGATIVE

## 2014-12-17 LAB — CBC WITH DIFFERENTIAL/PLATELET
BASOS PCT: 0 %
Basophils Absolute: 0 10*3/uL (ref 0–0.1)
Eosinophils Absolute: 0.1 10*3/uL (ref 0–0.7)
Eosinophils Relative: 1 %
HCT: 29.4 % — ABNORMAL LOW (ref 35.0–47.0)
HEMOGLOBIN: 9.2 g/dL — AB (ref 12.0–16.0)
Lymphocytes Relative: 12 %
Lymphs Abs: 1.6 10*3/uL (ref 1.0–3.6)
MCH: 22.6 pg — ABNORMAL LOW (ref 26.0–34.0)
MCHC: 31.4 g/dL — ABNORMAL LOW (ref 32.0–36.0)
MCV: 71.9 fL — AB (ref 80.0–100.0)
Monocytes Absolute: 0.8 10*3/uL (ref 0.2–0.9)
Monocytes Relative: 6 %
NEUTROS PCT: 81 %
Neutro Abs: 10.6 10*3/uL — ABNORMAL HIGH (ref 1.4–6.5)
Platelets: 176 10*3/uL (ref 150–440)
RBC: 4.08 MIL/uL (ref 3.80–5.20)
RDW: 18.7 % — AB (ref 11.5–14.5)
WBC: 13.1 10*3/uL — ABNORMAL HIGH (ref 3.6–11.0)

## 2014-12-17 SURGERY — Surgical Case
Anesthesia: Spinal

## 2014-12-17 SURGERY — Surgical Case
Anesthesia: Choice

## 2014-12-17 MED ORDER — LACTATED RINGERS IV BOLUS (SEPSIS)
500.0000 mL | Freq: Once | INTRAVENOUS | Status: AC
  Administered 2014-12-17: 14:00:00 via INTRAVENOUS
  Administered 2014-12-17: 500 mL via INTRAVENOUS

## 2014-12-17 MED ORDER — DIPHENHYDRAMINE HCL 25 MG PO CAPS: 25.0000 mg | ORAL_CAPSULE | ORAL | Status: DC | PRN

## 2014-12-17 MED ORDER — BUPIVACAINE 0.5 % ON-Q PUMP DUAL CATH 300 ML: 300.0000 mL | INJECTION | Freq: Once | Status: DC

## 2014-12-17 MED ORDER — NALBUPHINE HCL 10 MG/ML IJ SOLN
5.0000 mg | INTRAMUSCULAR | Status: DC | PRN
  Filled 2014-12-17: qty 0.5

## 2014-12-17 MED ORDER — NALOXONE HCL 0.4 MG/ML IJ SOLN: 0.4000 mg | INTRAMUSCULAR | Status: DC | PRN

## 2014-12-17 MED ORDER — ONDANSETRON HCL 4 MG/2ML IJ SOLN
INTRAMUSCULAR | Status: DC | PRN
  Administered 2014-12-17: 4 mg via INTRAVENOUS

## 2014-12-17 MED ORDER — SIMETHICONE 80 MG PO CHEW
80.0000 mg | CHEWABLE_TABLET | Freq: Three times a day (TID) | ORAL | Status: DC
  Administered 2014-12-17 – 2014-12-19 (×6): 80 mg via ORAL
  Filled 2014-12-17 (×6): qty 1

## 2014-12-17 MED ORDER — FERROUS SULFATE 325 (65 FE) MG PO TABS
325.0000 mg | ORAL_TABLET | Freq: Two times a day (BID) | ORAL | Status: DC
  Administered 2014-12-17 – 2014-12-19 (×4): 325 mg via ORAL
  Filled 2014-12-17 (×4): qty 1

## 2014-12-17 MED ORDER — BUPIVACAINE 0.25 % ON-Q PUMP DUAL CATH 400 ML
INJECTION | Status: AC
Start: 2014-12-17 — End: 2014-12-18
  Filled 2014-12-17: qty 400

## 2014-12-17 MED ORDER — CEFAZOLIN SODIUM-DEXTROSE 2-3 GM-% IV SOLR
2.0000 g | INTRAVENOUS | Status: AC
  Administered 2014-12-17: 2 g via INTRAVENOUS

## 2014-12-17 MED ORDER — PANTOPRAZOLE SODIUM 40 MG PO TBEC
40.0000 mg | DELAYED_RELEASE_TABLET | Freq: Every day | ORAL | Status: DC
  Administered 2014-12-18 – 2014-12-19 (×2): 40 mg via ORAL
  Filled 2014-12-17 (×2): qty 1

## 2014-12-17 MED ORDER — PRENATAL MULTIVITAMIN CH
1.0000 | ORAL_TABLET | Freq: Every day | ORAL | Status: DC
  Administered 2014-12-18 – 2014-12-19 (×2): 1 via ORAL
  Filled 2014-12-17 (×2): qty 1

## 2014-12-17 MED ORDER — WITCH HAZEL-GLYCERIN EX PADS: 1.0000 "application " | MEDICATED_PAD | CUTANEOUS | Status: DC | PRN

## 2014-12-17 MED ORDER — OXYTOCIN 10 UNIT/ML IJ SOLN
40.0000 [IU] | INTRAVENOUS | Status: DC | PRN
  Administered 2014-12-17: 30 [IU] via INTRAVENOUS

## 2014-12-17 MED ORDER — ONDANSETRON HCL 4 MG/2ML IJ SOLN: 4.0000 mg | Freq: Three times a day (TID) | INTRAMUSCULAR | Status: DC | PRN

## 2014-12-17 MED ORDER — MORPHINE SULFATE (PF) 0.5 MG/ML IJ SOLN
INTRAMUSCULAR | Status: DC | PRN
  Administered 2014-12-17: .2 mg via INTRATHECAL

## 2014-12-17 MED ORDER — OXYCODONE-ACETAMINOPHEN 5-325 MG PO TABS
1.0000 | ORAL_TABLET | ORAL | Status: DC | PRN
  Administered 2014-12-18 – 2014-12-19 (×3): 1 via ORAL
  Filled 2014-12-17: qty 1
  Filled 2014-12-17 (×2): qty 2

## 2014-12-17 MED ORDER — CITRIC ACID-SODIUM CITRATE 334-500 MG/5ML PO SOLN
30.0000 mL | Freq: Once | ORAL | Status: AC
  Administered 2014-12-17: 30 mL via ORAL

## 2014-12-17 MED ORDER — SENNOSIDES-DOCUSATE SODIUM 8.6-50 MG PO TABS
2.0000 | ORAL_TABLET | ORAL | Status: DC
  Administered 2014-12-17: 2 via ORAL
  Filled 2014-12-17: qty 2

## 2014-12-17 MED ORDER — LACTATED RINGERS IV SOLN: INTRAVENOUS | Status: DC

## 2014-12-17 MED ORDER — OXYTOCIN 40 UNITS IN LACTATED RINGERS INFUSION - SIMPLE MED
62.5000 mL/h | INTRAVENOUS | Status: DC
  Administered 2014-12-17: 62.5 mL/h via INTRAVENOUS
  Filled 2014-12-17: qty 1000

## 2014-12-17 MED ORDER — EPHEDRINE SULFATE 50 MG/ML IJ SOLN
INTRAMUSCULAR | Status: DC | PRN
  Administered 2014-12-17: 5 mg via INTRAVENOUS

## 2014-12-17 MED ORDER — MENTHOL 3 MG MT LOZG
1.0000 | LOZENGE | OROMUCOSAL | Status: DC | PRN
  Filled 2014-12-17: qty 9

## 2014-12-17 MED ORDER — DEXTROSE 5 % IV SOLN
1.0000 ug/kg/h | INTRAVENOUS | Status: DC | PRN
  Filled 2014-12-17: qty 2

## 2014-12-17 MED ORDER — TERBUTALINE SULFATE 1 MG/ML IJ SOLN
0.2500 mg | Freq: Once | INTRAMUSCULAR | Status: AC
  Administered 2014-12-17: 0.25 mg via SUBCUTANEOUS

## 2014-12-17 MED ORDER — BUPIVACAINE HCL (PF) 0.5 % IJ SOLN
INTRAMUSCULAR | Status: AC
  Filled 2014-12-17: qty 30

## 2014-12-17 MED ORDER — TERBUTALINE SULFATE 1 MG/ML IJ SOLN
INTRAMUSCULAR | Status: AC
  Administered 2014-12-17: 11:00:00
  Filled 2014-12-17: qty 1

## 2014-12-17 MED ORDER — ONDANSETRON HCL 4 MG/2ML IJ SOLN: 4.0000 mg | Freq: Once | INTRAMUSCULAR | Status: DC | PRN

## 2014-12-17 MED ORDER — MEPERIDINE HCL 25 MG/ML IJ SOLN: 6.2500 mg | INTRAMUSCULAR | Status: DC | PRN

## 2014-12-17 MED ORDER — BUPIVACAINE 0.25 % ON-Q PUMP DUAL CATH 400 ML: 400.0000 mL | INJECTION | Status: DC

## 2014-12-17 MED ORDER — DIBUCAINE 1 % RE OINT: 1.0000 "application " | TOPICAL_OINTMENT | RECTAL | Status: DC | PRN

## 2014-12-17 MED ORDER — BUPIVACAINE ON-Q PAIN PUMP (FOR ORDER SET NO CHG)
INJECTION | Status: DC
  Filled 2014-12-17: qty 1

## 2014-12-17 MED ORDER — FENTANYL CITRATE (PF) 100 MCG/2ML IJ SOLN
INTRAMUSCULAR | Status: DC | PRN
  Administered 2014-12-17: 20 ug via INTRATHECAL

## 2014-12-17 MED ORDER — LACTATED RINGERS IV BOLUS (SEPSIS)
1000.0000 mL | INTRAVENOUS | Status: DC
  Administered 2014-12-17 (×2): 1000 mL via INTRAVENOUS

## 2014-12-17 MED ORDER — NALBUPHINE HCL 10 MG/ML IJ SOLN
5.0000 mg | Freq: Once | INTRAMUSCULAR | Status: DC | PRN
  Filled 2014-12-17: qty 0.5

## 2014-12-17 MED ORDER — CEFAZOLIN SODIUM-DEXTROSE 2-3 GM-% IV SOLR
INTRAVENOUS | Status: AC
  Administered 2014-12-17: 2 g via INTRAVENOUS
  Filled 2014-12-17: qty 50

## 2014-12-17 MED ORDER — SCOPOLAMINE 1 MG/3DAYS TD PT72
1.0000 | MEDICATED_PATCH | Freq: Once | TRANSDERMAL | Status: DC
  Filled 2014-12-17: qty 1

## 2014-12-17 MED ORDER — DIPHENHYDRAMINE HCL 25 MG PO CAPS
25.0000 mg | ORAL_CAPSULE | Freq: Four times a day (QID) | ORAL | Status: DC | PRN
  Administered 2014-12-18: 25 mg via ORAL
  Filled 2014-12-17: qty 1

## 2014-12-17 MED ORDER — ENOXAPARIN SODIUM 40 MG/0.4ML ~~LOC~~ SOLN
40.0000 mg | Freq: Two times a day (BID) | SUBCUTANEOUS | Status: DC
  Administered 2014-12-18 – 2014-12-19 (×2): 40 mg via SUBCUTANEOUS
  Filled 2014-12-17 (×2): qty 0.4

## 2014-12-17 MED ORDER — BUPIVACAINE HCL (PF) 0.75 % IJ SOLN: INTRAMUSCULAR | Status: DC | PRN

## 2014-12-17 MED ORDER — SODIUM CHLORIDE 0.9 % IJ SOLN: 3.0000 mL | INTRAMUSCULAR | Status: DC | PRN

## 2014-12-17 MED ORDER — DIPHENHYDRAMINE HCL 50 MG/ML IJ SOLN
12.5000 mg | INTRAMUSCULAR | Status: DC | PRN
  Administered 2014-12-17: 12.5 mg via INTRAVENOUS
  Filled 2014-12-17: qty 1

## 2014-12-17 MED ORDER — LACTATED RINGERS IV SOLN
INTRAVENOUS | Status: DC | PRN
Start: 2014-12-17 — End: 2014-12-17
  Administered 2014-12-17 (×2): via INTRAVENOUS

## 2014-12-17 MED ORDER — LANOLIN HYDROUS EX OINT: 1.0000 "application " | TOPICAL_OINTMENT | CUTANEOUS | Status: DC | PRN

## 2014-12-17 MED ORDER — FENTANYL CITRATE (PF) 100 MCG/2ML IJ SOLN: 25.0000 ug | INTRAMUSCULAR | Status: DC | PRN

## 2014-12-17 MED ORDER — SERTRALINE HCL 50 MG PO TABS
50.0000 mg | ORAL_TABLET | Freq: Every day | ORAL | Status: DC
  Administered 2014-12-18 – 2014-12-19 (×2): 50 mg via ORAL
  Filled 2014-12-17 (×3): qty 1

## 2014-12-17 MED ORDER — PHENYLEPHRINE HCL 10 MG/ML IJ SOLN
INTRAMUSCULAR | Status: DC | PRN
  Administered 2014-12-17 (×6): 100 ug via INTRAVENOUS

## 2014-12-17 MED ORDER — IBUPROFEN 600 MG PO TABS
600.0000 mg | ORAL_TABLET | Freq: Four times a day (QID) | ORAL | Status: DC | PRN
  Administered 2014-12-17 – 2014-12-19 (×5): 600 mg via ORAL
  Filled 2014-12-17 (×5): qty 1

## 2014-12-17 MED ORDER — BUPIVACAINE IN DEXTROSE 0.75-8.25 % IT SOLN
INTRATHECAL | Status: DC | PRN
  Administered 2014-12-17: 11 mg via INTRATHECAL

## 2014-12-17 SURGICAL SUPPLY — 27 items
CANISTER SUCT 3000ML (MISCELLANEOUS) ×2 IMPLANT
CATH KIT ON-Q SILVERSOAK 5IN (CATHETERS) IMPLANT
DRSG TELFA 3X8 NADH (GAUZE/BANDAGES/DRESSINGS) ×2 IMPLANT
ELECT CAUTERY BLADE 6.4 (BLADE) ×4 IMPLANT
GAUZE SPONGE 4X4 12PLY STRL (GAUZE/BANDAGES/DRESSINGS) ×2 IMPLANT
GLOVE BIO SURGEON STRL SZ7 (GLOVE) ×2 IMPLANT
GLOVE INDICATOR 7.5 STRL GRN (GLOVE) ×2 IMPLANT
GOWN STRL REUS W/ TWL LRG LVL3 (GOWN DISPOSABLE) ×4 IMPLANT
GOWN STRL REUS W/TWL LRG LVL3 (GOWN DISPOSABLE) ×4
LIQUID BAND (GAUZE/BANDAGES/DRESSINGS) ×2 IMPLANT
NS IRRIG 1000ML POUR BTL (IV SOLUTION) ×2 IMPLANT
PACK C SECTION AR (MISCELLANEOUS) ×2 IMPLANT
PAD GROUND ADULT SPLIT (MISCELLANEOUS) ×2 IMPLANT
PAD OB MATERNITY 4.3X12.25 (PERSONAL CARE ITEMS) ×4 IMPLANT
PAD PREP 24X41 OB/GYN DISP (PERSONAL CARE ITEMS) ×2 IMPLANT
SPONGE LAP 18X18 5 PK (GAUZE/BANDAGES/DRESSINGS) ×4 IMPLANT
STRIP CLOSURE SKIN 1/2X4 (GAUZE/BANDAGES/DRESSINGS) ×4 IMPLANT
SUT CHROMIC GUT BROWN 0 54 (SUTURE) ×1 IMPLANT
SUT CHROMIC GUT BROWN 0 54IN (SUTURE) ×2
SUT MNCRL 4-0 (SUTURE) ×1
SUT MNCRL 4-0 27XMFL (SUTURE) ×1
SUT MNCRL AB 4-0 PS2 18 (SUTURE) ×2 IMPLANT
SUT PDS AB 1 TP1 96 (SUTURE) ×2 IMPLANT
SUT PLAIN 2 0 XLH (SUTURE) ×4 IMPLANT
SUT VIC AB 0 CT1 36 (SUTURE) ×8 IMPLANT
SUTURE MNCRL 4-0 27XMF (SUTURE) ×1 IMPLANT
SWABSTK COMLB BENZOIN TINCTURE (MISCELLANEOUS) ×2 IMPLANT

## 2014-12-17 NOTE — Transfer of Care (Signed)
Immediate Anesthesia Transfer of Care Note  Patient: Joanna Scott  Procedure(s) Performed: Procedure(s): CESAREAN SECTION (N/A)  Patient Location: PACU  Anesthesia Type:Spinal  Level of Consciousness: awake, alert  and oriented  Airway & Oxygen Therapy: Patient Spontanous Breathing and Patient connected to nasal cannula oxygen  Post-op Assessment: Report given to RN and Post -op Vital signs reviewed and stable  Post vital signs: Reviewed and stable  Last Vitals:  Filed Vitals:   12/17/14 1526  BP: 125/80  Pulse: 74  Temp: 37 C  Resp: 13    Complications: No apparent anesthesia complications

## 2014-12-17 NOTE — Anesthesia Procedure Notes (Signed)
Spinal Patient location during procedure: OB Start time: 12/17/2014 1:55 PM Staffing Performed by: anesthesiologist  Preanesthetic Checklist Completed: patient identified, site marked, surgical consent, pre-op evaluation, timeout performed, IV checked, risks and benefits discussed and monitors and equipment checked Spinal Block Patient position: sitting Prep: Betadine Patient monitoring: heart rate, cardiac monitor, continuous pulse ox and blood pressure Location: L4-5 Injection technique: single-shot Needle Needle type: Whitacre  Needle gauge: 27 G Needle length: 9 cm Assessment Sensory level: T4 Additional Notes .tol well with vsst +csf 4 Quad.  np paresthesia

## 2014-12-17 NOTE — H&P (Signed)
Obstetric History and Physical  [name] is a 39 y.o. [id number] with Estimated Date of Delivery: 01/02/15 per 7 wk US who presents at 1112w5d  Presenting with c/o leaking fluid since 0500 this am and contractions. Patient states she has been having regular contractions q 3- 5 min, light vaginal bleeding with leaking , intact, clear fluid membranes, with active fetal movement.  Pt was seen yesterday early morning for c/o contractions, her cervix was 1.5/20/-3 on discharge.   Prenatal Course Source of Care: [facility]  with onset of care at 7 weeks Pregnancy complications or risks: Patient Active Problem List   Diagnosis Date Noted  . Pregnant and not yet delivered 12/16/2014  . Previous cesarean delivery, delivered 12/16/2014  . Indication for care in labor and delivery, antepartum 12/16/2014   She plans to breastfeed She desires undecided for postpartum contraception.   Prenatal labs and studies: ABO, Rh: O neg  Antibody: neg Rubella: Immune Varicella: Immune RPR:  NR HBsAg:  Neg HIV: Neg  GC/CT: neg/neg GBS: neg 1 hr Glucola: 115   Genetic screening: negative Informaseq    Prenatal Transfer Tool   Past Medical History  Diagnosis Date  . Family history of malignant hyperthermia   . Anemia   . Anxiety   . Preeclampsia in postpartum period   . Portal vein thrombosis   . Prothrombin gene mutation   . Obesity affecting pregnancy   . Pregnancy   . Previous cesarean section     Past Surgical History  Procedure Laterality Date  . Cesarean section    . Diagnostic laparoscopy    . Laparoscopic cholecystectomy    . Dilation and curettage of uterus      OB History  Gravida Para Term Preterm AB SAB TAB Ectopic Multiple Living  5 1 1  0 3 3 0 0 0 1    # Outcome Date GA Lbr Len/2nd Weight Sex Delivery Anes PTL Lv  5 Current           4 Term 07/16/09   2.778 kg (6 lb 2 oz) M CS-LTranv Spinal N Y     Complications: Failure to Progress in First Stage,Preeclampsia in postpartum  period  3 SAB           2 SAB           1 SAB               History   Social History  . Marital Status: Married    Spouse Name: N/A  . Number of Children: N/A  . Years of Education: N/A   Social History Main Topics  . Smoking status: Never Smoker   . Smokeless tobacco: Never Used  . Alcohol Use: No  . Drug Use: No  . Sexual Activity: Yes    Birth Control/ Protection: None   Other Topics Concern  . None   Social History Narrative    Family History  Problem Relation Age of Onset  . Malignant hyperthermia Cousin   . Deep vein thrombosis Maternal Grandmother   . Stroke Paternal Grandmother     Prescriptions prior to admission  Medication Sig Dispense Refill Last Dose  . enoxaparin (LOVENOX) 40 MG/0.4ML injection Inject 40 mg into the skin every 12 (twelve) hours.   12/16/2014 at 2300  . ferrous sulfate 325 (65 FE) MG tablet Take 325 mg by mouth daily with breakfast.   12/15/2014 at 1745  . folic acid (FOLVITE) 1 MG tablet Take 1 mg by mouth  daily.   12/15/2014 at 1745  . pantoprazole (PROTONIX) 40 MG tablet Take 40 mg by mouth daily.   12/16/2014 at Unknown time  . Prenatal Vit-Fe Fumarate-FA (PRENATAL MULTIVITAMIN) TABS tablet Take 1 tablet by mouth daily at 12 noon.   12/15/2014 at 1745  . sertraline (ZOLOFT) 50 MG tablet Take 50 mg by mouth daily.   12/16/2014 at Unknown time    No Known Allergies  Review of Systems: Negative except for what is mentioned in HPI.  Physical Exam: BP 134/90 mmHg  Pulse 90  Temp(Src) 98.5 F (36.9 C) (Oral)  Ht  (1.6 m)  Wt 102.059 kg (225 lb)  BMI 39.87 kg/m2  LMP 03/28/2014 GENERAL: Well-developed, well-nourished female in no acute distress.  LUNGS: Clear to auscultation bilaterally.  HEART: Regular rate and rhythm. ABDOMEN: Soft, nontender, nondistended, gravid. EXTREMITIES: Nontender, no edema Cervical Exam: Dilatation 1.5cm   Effacement 90%   Station -2   Presentation: cephalic FHT: Category: Baseline rate 135 bpm    Variability moderate  Accelerations present   Decelerations none Contractions: Every 3-5  mins   Pertinent Labs/Studies:   No results found for this or any previous visit (from the past 24 hour(s)).  Assessment : IUP at [redacted]w[redacted]d with SROM, early labor   Plan: Admission for delivery via repeat c-section Spoke with Dr Henrene Hawking and Dr Jean Rosenthal. Will plan for surgery around noon if pt remains stable as she last had Lovenox at 2300 last night.

## 2014-12-17 NOTE — OB Triage Note (Signed)
csection delayed rt emerganecy

## 2014-12-17 NOTE — Op Note (Signed)
Cesarean Section Procedure Note   Joanna DelayHeidi Scott   12/17/2014   Pre-operative Diagnosis: 1) Intrauterine pregnancy at 5880w5d, 2) spontaneous rupture of membranes, 3) history of previous cesarean delivery, desires repeat, 4) prothrombin gene mutation.   Post-operative Diagnosis: 1) Intrauterine pregnancy at 3180w5d, 2) spontaneous rupture of membranes, 3) history of previous cesarean delivery, desires repeat, 4) prothrombin gene mutation   Surgeon: Surgeon(s) and Role:    * Conard NovakStephen D Tyjah Hai, MD - Primary   Assistants: Marta Antuamara Brothers, CNM  Anesthesia: spinal  Findings:  1) normal appearing gravid uterus, fallopian tubes, and ovaries 2) adhesion of omentum to right lower uterine segment near bladder reflection   Estimated Blood Loss: 600 mL  Total IV Fluids: 1,600 ml   Urine Output: 200 mL clear urine at end of procedure  Specimens: None  Complications: no complications  Disposition: PACU - hemodynamically stable.   Maternal Condition: stable   Baby condition / location:  Couplet care / Skin to Skin  Procedure Details:  The patient was seen in the Holding Room. The risks, benefits, complications, treatment options, and expected outcomes were discussed with the patient. The patient concurred with the proposed plan, giving informed consent. identified as Joanna Scott and the procedure verified as C-Section Delivery. A Time Out was held and the above information confirmed.   After induction of anesthesia, the patient was prepped and draped in the usual sterile manner. A Pfannenstiel incision was made and carried down through the subcutaneous tissue to the fascia. Fascial incision was made and extended transversely. The fascia was separated from the underlying rectus tissue superiorly and inferiorly. The peritoneum was identified and entered. Peritoneal incision was extended longitudinally. Omentum was tucked out of the way with a moist sponge with a tag to the patient's right lateral side.   The bladder flap was sharply freed from the lower uterine segment. A low transverse uterine incision was made and the hysterotomy was extended with cranial-caudal tension. Delivered from cephalic presentation was a 3,358 gram Living newborn infant(s) with Apgar scores of 8 at one minute and 9 at five minutes. Cord ph was not sent the umbilical cord was clamped and cut cord blood was obtained for evaluation. The placenta was removed Intact and appeared normal. The uterine outline, tubes and ovaries appeared normal}. The uterine incision was closed with running locked sutures of 0 Vicryl.  A second layer of the same suture was thrown in an imbricating fashion.  Hemostasis was assured.  The uterus was returned to the abdomen and the paracolic gutters were cleared of all clots and debris.  The peritoneum was reapproximated using 0 vicryl in a running fashion. The rectus muscles were inspected and found to be hemostatic.  The fascia was then reapproximated with running sutures of 1-0 PDS, looped. The subcutaneous tissue was reapproximated using 2-0 plain gut such that no greater than 2cm of dead space remained. The subcuticular closure was performed using 4-0 monocryl. The skin closure was reinforced using benzoin and 1/2" steri-strips.  Instrument, sponge, and needle counts were correct prior the abdominal closure and were correct at the conclusion of the case.  The patient received Ancef 2 gram IV prior to skin incision (within 30 minutes). For VTE prophylaxis she was wearing SCDs throughout the case.   Signed: Conard NovakStephen D. Jamae Tison, MD 12/17/2014 3:11 PM

## 2014-12-18 ENCOUNTER — Encounter: Payer: Self-pay | Admitting: Obstetrics and Gynecology

## 2014-12-18 LAB — TYPE AND SCREEN
ABO/RH(D): O NEG
ANTIBODY SCREEN: NEGATIVE

## 2014-12-18 LAB — RPR
RPR Ser Ql: NONREACTIVE
RPR: NONREACTIVE

## 2014-12-18 LAB — CBC
HCT: 27.4 % — ABNORMAL LOW (ref 35.0–47.0)
HEMOGLOBIN: 8.5 g/dL — AB (ref 12.0–16.0)
MCH: 22.7 pg — ABNORMAL LOW (ref 26.0–34.0)
MCHC: 31 g/dL — AB (ref 32.0–36.0)
MCV: 73.2 fL — AB (ref 80.0–100.0)
Platelets: 162 10*3/uL (ref 150–440)
RBC: 3.74 MIL/uL — AB (ref 3.80–5.20)
RDW: 18.3 % — ABNORMAL HIGH (ref 11.5–14.5)
WBC: 11.7 10*3/uL — AB (ref 3.6–11.0)

## 2014-12-18 LAB — FETAL SCREEN: Fetal Screen: NEGATIVE

## 2014-12-18 MED ORDER — RHO D IMMUNE GLOBULIN 1500 UNIT/2ML IJ SOSY
300.0000 ug | PREFILLED_SYRINGE | Freq: Once | INTRAMUSCULAR | Status: AC
  Administered 2014-12-18: 300 ug via INTRAMUSCULAR
  Filled 2014-12-18: qty 2

## 2014-12-18 NOTE — Anesthesia Preprocedure Evaluation (Signed)
Anesthesia Evaluation  Patient identified by MRN, date of birth, ID band Patient awake    Reviewed: Allergy & Precautions, H&P , NPO status , Patient's Chart, lab work & pertinent test results, reviewed documented beta blocker date and time   Airway Mallampati: II  TM Distance: >3 FB Neck ROM: full    Dental no notable dental hx.    Pulmonary neg pulmonary ROS,  breath sounds clear to auscultation  Pulmonary exam normal       Cardiovascular Exercise Tolerance: Good hypertension, negative cardio ROS  Rhythm:regular Rate:Normal     Neuro/Psych negative neurological ROS  negative psych ROS   GI/Hepatic negative GI ROS, Neg liver ROS,   Endo/Other  negative endocrine ROS  Renal/GU negative Renal ROS  negative genitourinary   Musculoskeletal   Abdominal   Peds  Hematology negative hematology ROS (+) anemia ,   Anesthesia Other Findings   Reproductive/Obstetrics (+) Pregnancy                             Anesthesia Physical Anesthesia Plan  ASA: II  Anesthesia Plan: Spinal   Post-op Pain Management:    Induction:   Airway Management Planned:   Additional Equipment:   Intra-op Plan:   Post-operative Plan:   Informed Consent: I have reviewed the patients History and Physical, chart, labs and discussed the procedure including the risks, benefits and alternatives for the proposed anesthesia with the patient or authorized representative who has indicated his/her understanding and acceptance.     Plan Discussed with:   Anesthesia Plan Comments:         Anesthesia Quick Evaluation

## 2014-12-18 NOTE — Progress Notes (Signed)
Subjective: Postpartum Day 1: Repeat Cesarean Delivery Patient reports tolerating PO and no problems voiding.  She reports having some cramping but just received ibuprofen for pain.   Objective: Vital signs in last 24 hours: Temp:  [97.7 F (36.5 C)-98.9 F (37.2 C)] 98.5 F (36.9 C) (06/07 0707) Pulse Rate:  [64-112] 79 (06/07 0707) Resp:  [0-20] 12 (06/07 0707) BP: (113-138)/(62-81) 113/72 mmHg (06/07 0707) SpO2:  [97 %-100 %] 98 % (06/07 0707)  Physical Exam:  General: alert, cooperative, appears stated age and no distress Lochia: appropriate Uterine Fundus: firm Incision: OR dressing c/d/i DVT Evaluation: No evidence of DVT seen on physical exam.   Recent Labs  12/17/14 1025 12/18/14 0618  HGB 9.2* 8.5*  HCT 29.4* 27.4*    Assessment/Plan: Status post Cesarean section. Doing well postoperatively.  Continue current care. Will DC IVF today Change OR dressing Lovenox today as scheduled Continue breastfeeding  Joanna Scott,  Joanna Scott 12/18/2014, 10:41 AM

## 2014-12-18 NOTE — Anesthesia Post-op Follow-up Note (Signed)
  Anesthesia Pain Follow-up Note  Patient: Joanna DelayHeidi Kempfer  Day #: 1  Date of Follow-up: 12/18/2014 Time: 7:23 AM  Last Vitals:  Filed Vitals:   12/18/14 0707  BP: 113/72  Pulse: 79  Temp: 36.9 C  Resp: 12    Level of Consciousness: alert  Pain: mild   Side Effects:None  Catheter Site Exam:  Plan: D/C from anesthesia care  Clydene PughBeane, Martez Weiand D

## 2014-12-18 NOTE — Anesthesia Postprocedure Evaluation (Signed)
  Anesthesia Post-op Note  Patient: Joanna Scott  Procedure(s) Performed: Procedure(s): CESAREAN SECTION (N/A)  Anesthesia type:Spinal  Patient location: Mother baby  Post pain: Pain level controlled  Post assessment: Post-op Vital signs reviewed, Patient's Cardiovascular Status Stable, Respiratory Function Stable, Patent Airway and No signs of Nausea or vomiting  Post vital signs: Reviewed and stable  Last Vitals:  Filed Vitals:   12/18/14 0707  BP: 113/72  Pulse: 79  Temp: 36.9 C  Resp: 12    Level of consciousness: awake, alert  and patient cooperative  Complications: No apparent anesthesia complications

## 2014-12-19 LAB — RHOGAM INJECTION: Unit division: 0

## 2014-12-19 MED ORDER — OXYCODONE-ACETAMINOPHEN 5-325 MG PO TABS: 1.0000 | ORAL_TABLET | ORAL | Status: DC | PRN

## 2014-12-19 MED ORDER — IBUPROFEN 600 MG PO TABS: 600.0000 mg | ORAL_TABLET | Freq: Four times a day (QID) | ORAL | Status: DC | PRN

## 2014-12-19 MED ORDER — ENOXAPARIN SODIUM 40 MG/0.4ML ~~LOC~~ SOLN: 40.0000 mg | Freq: Two times a day (BID) | SUBCUTANEOUS | Status: DC

## 2014-12-19 MED ORDER — DOCUSATE SODIUM 100 MG PO CAPS: 100.0000 mg | ORAL_CAPSULE | Freq: Two times a day (BID) | ORAL | Status: DC | PRN

## 2014-12-19 MED ORDER — FERROUS SULFATE 325 (65 FE) MG PO TABS: 325.0000 mg | ORAL_TABLET | Freq: Two times a day (BID) | ORAL | Status: DC

## 2014-12-19 NOTE — Progress Notes (Signed)
All discharge instructions given to patient and she voiced understanding of all instructions given. Prescriptions given.  appt made for 5 day incision check and she will make her own 6 wks check up.  Patient discharged home with infant and spouse.

## 2014-12-19 NOTE — Discharge Instructions (Signed)
Follow up sooner with fever greater than 100.5, problems breathing, pain not helped by medications, severe depression ( more than just baby blues, wanting to hurt yourself or the baby), severe bleeding( saturating more than one pad an hour or large palm sized clots),or any incisional concerns such as increased redness, swelling, discharge or increased pain in incision.  No heavy lifting for 6 weeks.  No driving while taking narcotics. No douches, intercourse, tampons, or enemas for 6 weeks.  °

## 2014-12-19 NOTE — Discharge Summary (Signed)
Obstetric Discharge Summary Reason for Admission: rupture of membranes Prenatal Procedures: NST Intrapartum Procedures: cesarean: low cervical, transverse Postpartum Procedures: none Complications-Operative and Postpartum: none HEMOGLOBIN  Date Value Ref Range Status  12/18/2014 8.5* 12.0 - 16.0 g/dL Final   HGB  Date Value Ref Range Status  09/18/2013 12.3 12.0-16.0 g/dL Final   HCT  Date Value Ref Range Status  12/18/2014 27.4* 35.0 - 47.0 % Final  09/18/2013 36.7 35.0-47.0 % Final    Physical Exam:  General: alert, cooperative and appears stated age 55Lochia: appropriate Uterine Fundus: firm Incision: healing well DVT Evaluation: No evidence of DVT seen on physical exam.  Discharge Diagnoses: Term Pregnancy-delivered  Discharge Information: Date: 12/19/2014 Activity: unrestricted Diet: routine Medications: PNV, Ibuprofen, Colace, Iron and Percocet Condition: stable Instructions: refer to practice specific booklet Discharge to: home Follow-up Information    Follow up with [facility]. Schedule an appointment as soon as possible for a visit in 5 days.   Why:  For wound re-check, for postpartum follow up      Newborn Data: Live born female  Birth Weight: 7 lb 6.2 oz (3351 g) APGAR: 8, 9  Home with mother.  Joanna Scott,  Joanna Scott 12/19/2014, 10:39 AM

## 2014-12-26 ENCOUNTER — Other Ambulatory Visit: Payer: Self-pay

## 2014-12-27 ENCOUNTER — Inpatient Hospital Stay
Admission: RE | Admit: 2014-12-27 | Payer: BLUE CROSS/BLUE SHIELD | Source: Ambulatory Visit | Admitting: Obstetrics & Gynecology

## 2014-12-27 ENCOUNTER — Encounter: Admission: RE | Payer: Self-pay | Source: Ambulatory Visit

## 2014-12-27 SURGERY — Surgical Case
Anesthesia: Spinal

## 2015-07-28 ENCOUNTER — Ambulatory Visit
Admission: RE | Admit: 2015-07-28 | Discharge: 2015-07-28 | Disposition: A | Discharge status: Home / Self Care | Payer: BLUE CROSS/BLUE SHIELD | Source: Ambulatory Visit | Attending: Obstetrics & Gynecology | Admitting: Obstetrics & Gynecology

## 2015-07-28 ENCOUNTER — Emergency Department
Admission: EM | Admit: 2015-07-28 | Discharge: 2015-07-29 | Disposition: A | Discharge status: Home / Self Care | Payer: BLUE CROSS/BLUE SHIELD | Attending: Emergency Medicine | Admitting: Emergency Medicine

## 2015-07-28 DIAGNOSIS — N61 Mastitis without abscess: Secondary | ICD-10-CM | POA: Diagnosis not present

## 2015-07-28 DIAGNOSIS — N644 Mastodynia: Secondary | ICD-10-CM | POA: Diagnosis present

## 2015-07-28 DIAGNOSIS — Z79899 Other long term (current) drug therapy: Secondary | ICD-10-CM | POA: Diagnosis not present

## 2015-07-28 MED ORDER — DICLOXACILLIN SODIUM 500 MG PO CAPS
500.0000 mg | ORAL_CAPSULE | Freq: Four times a day (QID) | ORAL | Status: DC
  Administered 2015-07-29: 500 mg via ORAL
  Filled 2015-07-28 (×7): qty 1

## 2015-07-28 MED ORDER — DICLOXACILLIN SODIUM 250 MG PO CAPS: 500.0000 mg | ORAL_CAPSULE | Freq: Four times a day (QID) | ORAL | Status: AC

## 2015-07-28 NOTE — ED Provider Notes (Signed)
[facility] Emergency Department Provider Note  ____________________________________________   I have reviewed the triage vital signs and the nursing notes.   HISTORY  Chief Complaint Breast Pain    HPI Joanna Scott is a 40 y.o. female actively breast-feeding, not diabetic, has an area of mild erythema and mild discomfort to the right breast since this morning. No fever no chills no vomiting. Was sent in here by lactation consultant.   Past Medical History  Diagnosis Date  . Family history of malignant hyperthermia   . Anemia   . Anxiety   . Preeclampsia in postpartum period   . Portal vein thrombosis   . Prothrombin gene mutation   . Obesity affecting pregnancy   . Pregnancy   . Previous cesarean section     Patient Active Problem List   Diagnosis Date Noted  . Postpartum care following cesarean delivery 12/19/2014  . S/P cesarean section 12/17/2014  . Pregnant and not yet delivered 12/16/2014  . Previous cesarean delivery, delivered 12/16/2014  . Indication for care in labor and delivery, antepartum 12/16/2014  . Bilateral polycystic ovarian syndrome 10/20/2012    Past Surgical History  Procedure Laterality Date  . Cesarean section    . Diagnostic laparoscopy    . Laparoscopic cholecystectomy    . Dilation and curettage of uterus    . Cesarean section N/A 12/17/2014    Procedure: CESAREAN SECTION;  Surgeon: Conard Novak, MD;  Location: [facility] ORS;  Service: Obstetrics;  Laterality: N/A;    Current Outpatient Rx  Name  Route  Sig  Dispense  Refill  . dicloxacillin (DYNAPEN) 250 MG capsule   Oral   Take 2 capsules (500 mg total) by mouth 4 (four) times daily.   40 capsule   0   . docusate sodium (COLACE) 100 MG capsule   Oral   Take 1 capsule (100 mg total) by mouth 2 (two) times daily as needed. While taking iron   60 capsule   3   . EXPIRED: enoxaparin (LOVENOX) 40 MG/0.4ML injection   Subcutaneous   Inject 0.4 mLs (40 mg  total) into the skin every 12 (twelve) hours.   60 Syringe   3   . ferrous sulfate 325 (65 FE) MG tablet   Oral   Take 1 tablet (325 mg total) by mouth 2 (two) times daily with a meal.   60 tablet   3   . ibuprofen (ADVIL,MOTRIN) 600 MG tablet   Oral   Take 1 tablet (600 mg total) by mouth every 6 (six) hours as needed for mild pain.   40 tablet   1   . oxyCODONE-acetaminophen (PERCOCET/ROXICET) 5-325 MG per tablet   Oral   Take 1-2 tablets by mouth every 4 (four) hours as needed for severe pain.   35 tablet   0   . Prenatal Vit-Fe Fumarate-FA (PRENATAL MULTIVITAMIN) TABS tablet   Oral   Take 1 tablet by mouth daily at 12 noon.         . sertraline (ZOLOFT) 50 MG tablet   Oral   Take 50 mg by mouth daily.           Allergies Review of patient's allergies indicates no known allergies.  Family History  Problem Relation Age of Onset  . Malignant hyperthermia Cousin   . Deep vein thrombosis Maternal Grandmother   . Stroke Paternal Grandmother     Social History Social History  Substance Use Topics  . Smoking status: Never  Smoker   . Smokeless tobacco: Never Used  . Alcohol Use: No    Review of Systems Constitutional: No fever/chills Eyes: No visual changes. ENT: No sore throat. No stiff neck no neck pain Cardiovascular: Denies chest pain. Respiratory: Denies shortness of breath. Gastrointestinal:   no vomiting.  No diarrhea.  No constipation. Genitourinary: Negative for dysuria. Musculoskeletal: Negative lower extremity swelling Skin:see history of present illness. Neurological: Negative for headaches, focal weakness or numbness. 10-point ROS otherwise negative.  ____________________________________________   PHYSICAL EXAM:  VITAL SIGNS: ED Triage Vitals  Enc Vitals Group     BP 07/28/15 2220 126/73 mmHg     Pulse Rate 07/28/15 2220 90     Resp 07/28/15 2220 18     Temp 07/28/15 2220 98.1 F (36.7 C)     Temp Source 07/28/15 2220 Oral      SpO2 07/28/15 2220 98 %     Weight 07/28/15 2220 220 lb (99.791 kg)     Height 07/28/15 2220 5\' 4"  (1.626 m)     Head Cir --      Peak Flow --      Pain Score 07/28/15 2221 4     Pain Loc --      Pain Edu? --      Excl. in GC? --     Constitutional: Alert and oriented. Well appearing and in no acute distress. Eyes: Conjunctivae are normal. PERRL. EOMI. Head: Atraumatic. Nose: No congestion/rhinnorhea. Mouth/Throat: Mucous membranes are moist.  Oropharynx non-erythematous. Neck: No stridor.   Nontender with no meningismus Cardiovascular: Normal rate, regular rhythm. Grossly normal heart sounds.  Good peripheral circulation. Past: Female nurse chaperone present. The right breast demonstrates a wedge-shaped erythema and mild tenderness with no fluctuance, and no induration with no obvious purulence from the nipple. The area in question is about 2:00 on the breast. Respiratory: Normal respiratory effort.  No retractions. Lungs CTAB. Skin:  Skin is warm, dry and intact.See above  Psychiatric: Mood and affect are normal. Speech and behavior are normal.  ____________________________________________   LABS (all labs ordered are listed, but only abnormal results are displayed)  Labs Reviewed  BODY FLUID CULTURE   ____________________________________________  EKG  I personally interpreted any EKGs ordered by me or triage  ____________________________________________  RADIOLOGY  I reviewed any imaging ordered by me or triage that were performed during my shift ____________________________________________   PROCEDURES  Procedure(s) performed: None  Critical Care performed: None  ____________________________________________   INITIAL IMPRESSION / ASSESSMENT AND PLAN / ED COURSE  Pertinent labs & imaging results that were available during my care of the patient were reviewed by me and considered in my medical decision making (see chart for details).  Patient with early  mild mastitis. We will start her on dicloxacillin. We'll send a culture. Patient has close outpatient follow-up. No evidence of abscess or systemic illness. Return precautions and follow-up given and understood.  ____________________________________________   FINAL CLINICAL IMPRESSION(S) / ED DIAGNOSES  Final diagnoses:  Mastitis     Jeanmarie PlantJames A Samentha Perham, MD 07/28/15 [phone]

## 2015-07-28 NOTE — Lactation Note (Signed)
Lactation Consultation Note  Patient Name: Joanna Scott EAVWU'JToday's Date: 07/28/2015     Maternal Data  Mother presented w/o baby due to mastitis. Mom states flu-like symptoms for the past 24hrs w/o fever.  Feeding  n/a  LATCH Score/Interventions  n/a                    Lactation Tools Discussed/Used  n/a   Consult Status  After analyzing red patch on right breast and noticing it is "hot to touch" LC referred patient to ED for antibiotics. Also informed mom to use ice and do frequent pumping and nursing to assist with emptying breasts. Reminded mom to continue feeding or pumping on usual schedule if at all possible.     Burnadette PeterJaniya M Alayza Pieper 07/28/2015, 10:18 PM

## 2015-07-28 NOTE — ED Notes (Signed)
Pt with c/o right breast pain that started today; was seen by lactation specialist on the mother/baby floor tonight who sent her down for treatment for mastitis

## 2015-07-29 NOTE — ED Notes (Signed)
Patient with no complaints at this time. Respirations even and unlabored. Skin warm/dry. Discharge instructions reviewed with patient at this time. Patient given opportunity to voice concerns/ask questions. Patient discharged at this time and left Emergency Department with steady gait.   

## 2015-08-01 LAB — WOUND CULTURE
Culture: NORMAL
Special Requests: NORMAL

## 2016-03-22 ENCOUNTER — Emergency Department
Admission: EM | Admit: 2016-03-22 | Discharge: 2016-03-22 | Disposition: A | Discharge status: Home / Self Care | Payer: BLUE CROSS/BLUE SHIELD | Attending: Student | Admitting: Student

## 2016-03-22 ENCOUNTER — Emergency Department: Payer: BLUE CROSS/BLUE SHIELD

## 2016-03-22 ENCOUNTER — Encounter: Payer: Self-pay | Admitting: *Deleted

## 2016-03-22 DIAGNOSIS — R079 Chest pain, unspecified: Secondary | ICD-10-CM | POA: Insufficient documentation

## 2016-03-22 DIAGNOSIS — R519 Headache, unspecified: Secondary | ICD-10-CM

## 2016-03-22 DIAGNOSIS — R197 Diarrhea, unspecified: Secondary | ICD-10-CM | POA: Diagnosis not present

## 2016-03-22 DIAGNOSIS — R51 Headache: Secondary | ICD-10-CM

## 2016-03-22 DIAGNOSIS — R112 Nausea with vomiting, unspecified: Secondary | ICD-10-CM | POA: Diagnosis not present

## 2016-03-22 LAB — TSH: TSH: 2.67 u[IU]/mL (ref 0.350–4.500)

## 2016-03-22 LAB — TROPONIN I: Troponin I: <0.03 ng/mL (ref <0.03)

## 2016-03-22 LAB — URINALYSIS COMPLETE WITH MICROSCOPIC (ARMC ONLY)
Bilirubin Urine: NEGATIVE
Glucose, UA: NEGATIVE mg/dL
Hgb urine dipstick: NEGATIVE
Ketones, ur: NEGATIVE mg/dL
Nitrite: NEGATIVE
PROTEIN: NEGATIVE mg/dL
Specific Gravity, Urine: 1.019 (ref 1.005–1.030)
pH: 5 (ref 5.0–8.0)

## 2016-03-22 LAB — COMPREHENSIVE METABOLIC PANEL
ALT: 17 U/L (ref 14–54)
AST: 24 U/L (ref 15–41)
Albumin: 4.2 g/dL (ref 3.5–5.0)
Alkaline Phosphatase: 91 U/L (ref 38–126)
Anion gap: 8 (ref 5–15)
BUN: 18 mg/dL (ref 6–20)
CALCIUM: 9.1 mg/dL (ref 8.9–10.3)
CO2: 22 mmol/L (ref 22–32)
CREATININE: 0.82 mg/dL (ref 0.44–1.00)
Chloride: 108 mmol/L (ref 101–111)
GFR calc Af Amer: >60 mL/min (ref >60)
GFR calc non Af Amer: >60 mL/min (ref >60)
Glucose, Bld: 102 mg/dL — ABNORMAL HIGH (ref 65–99)
Potassium: 4.1 mmol/L (ref 3.5–5.1)
Sodium: 138 mmol/L (ref 135–145)
Total Bilirubin: 0.2 mg/dL — ABNORMAL LOW (ref 0.3–1.2)
Total Protein: 7.6 g/dL (ref 6.5–8.1)

## 2016-03-22 LAB — CBC
HCT: 39.8 % (ref 35.0–47.0)
Hemoglobin: 13.5 g/dL (ref 12.0–16.0)
MCH: 26.7 pg (ref 26.0–34.0)
MCHC: 34 g/dL (ref 32.0–36.0)
MCV: 78.5 fL — ABNORMAL LOW (ref 80.0–100.0)
Platelets: 254 10*3/uL (ref 150–440)
RBC: 5.07 MIL/uL (ref 3.80–5.20)
RDW: 15.8 % — ABNORMAL HIGH (ref 11.5–14.5)
WBC: 11.9 10*3/uL — ABNORMAL HIGH (ref 3.6–11.0)

## 2016-03-22 LAB — POCT PREGNANCY, URINE: Preg Test, Ur: NEGATIVE

## 2016-03-22 LAB — T4, FREE: Free T4: 0.87 ng/dL (ref 0.61–1.12)

## 2016-03-22 LAB — LIPASE, BLOOD: Lipase: 29 U/L (ref 11–51)

## 2016-03-22 MED ORDER — DIPHENHYDRAMINE HCL 50 MG/ML IJ SOLN
12.5000 mg | Freq: Once | INTRAMUSCULAR | Status: AC
  Administered 2016-03-22: 12.5 mg via INTRAVENOUS
  Filled 2016-03-22: qty 1

## 2016-03-22 MED ORDER — IOPAMIDOL (ISOVUE-370) INJECTION 76%
75.0000 mL | Freq: Once | INTRAVENOUS | Status: AC | PRN
  Administered 2016-03-22: 75 mL via INTRAVENOUS

## 2016-03-22 MED ORDER — METOCLOPRAMIDE HCL 5 MG/ML IJ SOLN
10.0000 mg | Freq: Once | INTRAMUSCULAR | Status: AC
  Administered 2016-03-22: 10 mg via INTRAVENOUS
  Filled 2016-03-22: qty 2

## 2016-03-22 MED ORDER — ONDANSETRON 4 MG PO TBDP: 4.0000 mg | ORAL_TABLET | Freq: Three times a day (TID) | ORAL | 0 refills | Status: DC | PRN

## 2016-03-22 MED ORDER — KETOROLAC TROMETHAMINE 30 MG/ML IJ SOLN
15.0000 mg | Freq: Once | INTRAMUSCULAR | Status: AC
  Administered 2016-03-22: 15 mg via INTRAVENOUS
  Filled 2016-03-22: qty 1

## 2016-03-22 MED ORDER — SODIUM CHLORIDE 0.9 % IV BOLUS (SEPSIS)
1000.0000 mL | Freq: Once | INTRAVENOUS | Status: AC
  Administered 2016-03-22: 1000 mL via INTRAVENOUS

## 2016-03-22 NOTE — ED Triage Notes (Signed)
Pt states for the past 2 months episodes of feeling normal and then vomiting and having a "sour stomach" with diarrhea, states she saw a PCP, was tested for cdiff (negative) and referred to GI, states this AM when she had an episode she suddenly had a feeling of her heart racing and a pounding headache, pt awake and alert in no acute distress

## 2016-03-22 NOTE — ED Notes (Signed)
Pt reports NVD, headache, and feeling like heart pounding intermittent for last 2 months. Started again today. Vomit X 3 today. Still reports headache but heart does not feel like pounding.

## 2016-03-22 NOTE — ED Notes (Addendum)
Waiting on disposition

## 2016-03-22 NOTE — ED Notes (Signed)
Returned from CT.

## 2016-03-22 NOTE — ED Provider Notes (Addendum)
[facility] Emergency Department Provider Note   ____________________________________________   None    (approximate)  I have reviewed the triage vital signs and the nursing notes.   HISTORY  Chief Complaint Headache, emesis, diarrhea, palpitations   HPI Joanna Scott is a 40 y.o. female with history of thrombin gene mutation/clotting disorder not currently anticoagulated who presents for evaluation of moderate global headache as well as recurrent nonbloody nonbilious emesis and nonbloody diarrhea today, gradual onset, constant, no modifying factors. Patient reports that she awoke from sleep with 3 episodes of emesis, 3 episodes of diarrhea, she is also complaining of a moderate headache which she rates as 7 out of 10 and feels like "pressure". She reports she has had migraines previously but this feels like "when my blood pressure was high when I had preeclampsia". She has had 7 or 8 episodes of this isolated nausea vomiting and diarrhea and is set to establish care with GI in the coming months. She denies any abdominal pain. No pain or burning with urination. No numbness or weakness in the arms or legs. She reports that while she is vomiting she felt as if her heart was racing but she specifically denies any chest pain or difficulty breathing, no fainting or near fainting. She denies any fevers or chills.   Past Medical History:  Diagnosis Date  . Anemia   . Anxiety   . Family history of malignant hyperthermia   . Obesity affecting pregnancy   . Portal vein thrombosis   . Preeclampsia in postpartum period   . Pregnancy   . Previous cesarean section   . Prothrombin gene mutation [facility])     Patient Active Problem List   Diagnosis Date Noted  . Postpartum care following cesarean delivery 12/19/2014  . S/P cesarean section 12/17/2014  . Pregnant and not yet delivered 12/16/2014  . Previous cesarean delivery, delivered 12/16/2014  . Indication for care in  labor and delivery, antepartum 12/16/2014  . Bilateral polycystic ovarian syndrome 10/20/2012    Past Surgical History:  Procedure Laterality Date  . CESAREAN SECTION    . CESAREAN SECTION N/A 12/17/2014   Procedure: CESAREAN SECTION;  Surgeon: Conard Novak, MD;  Location: [facility] ORS;  Service: Obstetrics;  Laterality: N/A;  . DIAGNOSTIC LAPAROSCOPY    . DILATION AND CURETTAGE OF UTERUS    . LAPAROSCOPIC CHOLECYSTECTOMY      Prior to Admission medications   Medication Sig Start Date End Date Taking? Authorizing Provider  Cholecalciferol (VITAMIN D3) 2000 units capsule Take 2,000 Units by mouth every evening.   Yes Historical Provider, MD  pantoprazole (PROTONIX) 40 MG tablet Take 40 mg by mouth every morning.   Yes Historical Provider, MD  Prenatal Vit-Fe Fumarate-FA (PRENATAL MULTIVITAMIN) TABS tablet Take 1 tablet by mouth every evening.    Yes Historical Provider, MD  sertraline (ZOLOFT) 100 MG tablet Take 100 mg by mouth every morning.   Yes Historical Provider, MD  docusate sodium (COLACE) 100 MG capsule Take 1 capsule (100 mg total) by mouth 2 (two) times daily as needed. While taking iron 12/19/14   Courtney Subudhi, CNM  enoxaparin (LOVENOX) 40 MG/0.4ML injection Inject 0.4 mLs (40 mg total) into the skin every 12 (twelve) hours. 12/19/14 12/21/14  Jannet Mantis, CNM  ferrous sulfate 325 (65 FE) MG tablet Take 1 tablet (325 mg total) by mouth 2 (two) times daily with a meal. 12/19/14   Courtney Subudhi, CNM  ibuprofen (ADVIL,MOTRIN) 600 MG tablet Take 1 tablet (  600 mg total) by mouth every 6 (six) hours as needed for mild pain. 12/19/14   Jannet Mantisourtney Subudhi, CNM  oxyCODONE-acetaminophen (PERCOCET/ROXICET) 5-325 MG per tablet Take 1-2 tablets by mouth every 4 (four) hours as needed for severe pain. 12/19/14   Jannet Mantisourtney Subudhi, CNM    Allergies Review of patient's allergies indicates no known allergies.  Family History  Problem Relation Age of Onset  . Malignant hyperthermia Cousin   .  Deep vein thrombosis Maternal Grandmother   . Stroke Paternal Grandmother     Social History Social History  Substance Use Topics  . Smoking status: Never Smoker  . Smokeless tobacco: Never Used  . Alcohol use No    Review of Systems Constitutional: No fever/chills Eyes: No visual changes. ENT: No sore throat. Cardiovascular: Denies chest pain. Respiratory: Denies shortness of breath. Gastrointestinal: No abdominal pain.  + nausea, + vomiting.  + diarrhea.  No constipation. Genitourinary: Negative for dysuria. Musculoskeletal: Negative for back pain. Skin: Negative for rash. Neurological: Positive for headache, no focal weakness or numbness.  10-point ROS otherwise negative.  ____________________________________________   PHYSICAL EXAM: Vitals:   03/22/16 0830 03/22/16 1000 03/22/16 1030 03/22/16 1100  BP: 127/76  (!) 124/94 125/89  Pulse: 75 68 68 74  Resp: 15 13 13 12   Temp:      TempSrc:      SpO2: 98% 98% 98% 99%  Weight:      Height:        VITAL SIGNS: ED Triage Vitals  Enc Vitals Group     BP 03/22/16 0759 (!) 135/94     Pulse Rate 03/22/16 0759 83     Resp 03/22/16 0759 18     Temp 03/22/16 0759 98.6 F (37 C)     Temp Source 03/22/16 0759 Oral     SpO2 03/22/16 0759 98 %     Weight 03/22/16 0800 205 lb (93 kg)     Height 03/22/16 0800 5\' 3"  (1.6 m)     Head Circumference --      Peak Flow --      Pain Score 03/22/16 0800 7     Pain Loc --      Pain Edu? --      Excl. in GC? --     Constitutional: Alert and oriented. Well appearing and in no acute distress. Eyes: Conjunctivae are normal. PERRL. EOMI. Head: Atraumatic. Nose: No congestion/rhinnorhea. Mouth/Throat: Mucous membranes are moist.  Oropharynx non-erythematous. Neck: No stridor.  Supple without meningismus. Cardiovascular: Normal rate, regular rhythm. Grossly normal heart sounds.  Good peripheral circulation. Respiratory: Normal respiratory effort.  No retractions. Lungs  CTAB. Gastrointestinal: Soft with faint tenderness in the epigastrium, no rebound or guarding. Normal bowel sounds.. No distention. No CVA tenderness. Genitourinary: Deferred Musculoskeletal: No lower extremity tenderness nor edema.  No joint effusions. Neurologic:  Normal speech and language. No gross focal neurologic deficits are appreciated. No gait instability. 5 out of 5 strength in bilateral upper and lower extremities, sensation intact to light touch throughout, cranial nerves II through XII intact. Skin:  Skin is warm, dry and intact. No rash noted. Psychiatric: Mood and affect are normal. Speech and behavior are normal.  ____________________________________________   LABS (all labs ordered are listed, but only abnormal results are displayed)  Labs Reviewed  COMPREHENSIVE METABOLIC PANEL - Abnormal; Notable for the following:       Result Value   Glucose, Bld 102 (*)    Total Bilirubin 0.2 (*)  All other components within normal limits  CBC - Abnormal; Notable for the following:    WBC 11.9 (*)    MCV 78.5 (*)    RDW 15.8 (*)    All other components within normal limits  URINALYSIS COMPLETEWITH MICROSCOPIC ([facility] ONLY) - Abnormal; Notable for the following:    Color, Urine YELLOW (*)    APPearance HAZY (*)    Leukocytes, UA TRACE (*)    Bacteria, UA RARE (*)    Squamous Epithelial / LPF 0-5 (*)    All other components within normal limits  LIPASE, BLOOD  TROPONIN I  TSH  T4, FREE  POC URINE PREG, ED  POCT PREGNANCY, URINE   ____________________________________________  EKG  ED ECG REPORT I, Gayla Doss, the attending physician, personally viewed and interpreted this ECG.   Date: 03/22/2016  EKG Time: 07:54  Rate: 80  Rhythm: normal EKG, normal sinus rhythm  Axis: normal  Intervals:none  ST&T Change: No acute ST elevation or acute ST depression.  ____________________________________________  RADIOLOGY  CXR IMPRESSION:  No radiographic evidence of  acute cardiopulmonary disease.     CTA head IMPRESSION:  1. Minimal atherosclerotic change within the cavernous right  internal carotid artery without significant stenosis.  2. Otherwise normal variant CTA circle of Willis without significant  proximal stenosis, aneurysm, or branch vessel occlusion.  3. Normal CT appearance brain without and with contrast.      ____________________________________________   PROCEDURES  Procedure(s) performed: None  Procedures  Critical Care performed: No  ____________________________________________   INITIAL IMPRESSION / ASSESSMENT AND PLAN / ED COURSE  Pertinent labs & imaging results that were available during my care of the patient were reviewed by me and considered in my medical decision making (see chart for details).  Joanna Scott is a 40 y.o. female with history of thrombin gene mutation/clotting disorder not currently anticoagulated who presents for evaluation of moderate global headache as well as recurrent nonbloody nonbilious emesis and nonbloody diarrhea today. On exam she is generally well-appearing in no acute distress. Vital signs stable and she is afebrile. She has a benign/intact neurological examination. Mild tenderness in the epigastrium but no rebound, no guarding, no rigidity. Her nausea and vomiting as well as diarrhea is nonspecific and this has happened to her multiple times, will treat symptomatically. For her headache, we'll obtain CT angiogram brain given history of clotting disorder though she has no evidence of neurological deficit at this time. Her neck is supple without meningismus and I doubt meningitis. Low suspicion for subarachnoid hemorrhage given only moderate headache, not severe, not sudden onset. We'll treat symptomatically with migraine cocktail as well. Palpitations are also nonspecific, she is normal sinus rhythm on the monitor, will check thyroid studies. EKG is  normal.  ----------------------------------------- 11:17 AM on 03/22/2016 ----------------------------------------- Patient reports complete resolution of her headache at this time and she is tolerating by mouth intake without vomiting. I reviewed her labs. She has mild leukocytosis with a white blood cell count of 11.9 however the remainder of her labs are unremarkable, CT including CMP, lipase, urinalysis which is not consistent with infection. Negative Pregnancy test. Normal thyroid studies. Negative troponin. She has been observed for several hours on the cardiac monitor without any arrhythmia or recurrence of palpitations. CT angiogram is within normal limits. Suspect migrainous variant. We discussed return precautions, need for close PCP follow-up and she is comfortable with the discharge plan. DC home.  Clinical Course     ____________________________________________   FINAL  CLINICAL IMPRESSION(S) / ED DIAGNOSES  Final diagnoses:  Acute nonintractable headache, unspecified headache type  Non-intractable vomiting with nausea, vomiting of unspecified type  Diarrhea, unspecified type      NEW MEDICATIONS STARTED DURING THIS VISIT:  New Prescriptions   No medications on file     Note:  This document was prepared using Dragon voice recognition software and may include unintentional dictation errors.    Gayla Doss, MD 03/22/16 1119    Gayla Doss, MD 03/22/16 [phone]

## 2017-03-23 ENCOUNTER — Other Ambulatory Visit: Payer: Self-pay | Admitting: Nurse Practitioner

## 2017-03-23 DIAGNOSIS — R102 Pelvic and perineal pain: Secondary | ICD-10-CM

## 2017-04-28 ENCOUNTER — Ambulatory Visit
Admission: RE | Admit: 2017-04-28 | Discharge: 2017-04-28 | Disposition: A | Discharge status: Home / Self Care | Payer: BLUE CROSS/BLUE SHIELD | Source: Ambulatory Visit | Attending: Nurse Practitioner | Admitting: Nurse Practitioner

## 2017-04-28 DIAGNOSIS — R102 Pelvic and perineal pain: Secondary | ICD-10-CM | POA: Diagnosis not present

## 2017-08-10 ENCOUNTER — Ambulatory Visit: Payer: Self-pay | Admitting: Medical

## 2017-08-10 VITALS — BP 130/86 | HR 102 | Temp 98.0°F | Resp 18 | Wt 252.0 lb

## 2017-08-10 DIAGNOSIS — J029 Acute pharyngitis, unspecified: Secondary | ICD-10-CM

## 2017-08-10 DIAGNOSIS — H6502 Acute serous otitis media, left ear: Secondary | ICD-10-CM

## 2017-08-10 MED ORDER — AMOXICILLIN 875 MG PO TABS: 875.0000 mg | ORAL_TABLET | Freq: Two times a day (BID) | ORAL | 0 refills | Status: DC

## 2017-08-10 NOTE — Patient Instructions (Signed)
Otitis Media, Adult Otitis media is redness, soreness, and puffiness (swelling) in the space just behind your eardrum (middle ear). It may be caused by allergies or infection. It often happens along with a cold. Follow these instructions at home:  Take your medicine as told. Finish it even if you start to feel better.  Only take over-the-counter or prescription medicines for pain, discomfort, or fever as told by your doctor.  Follow up with your doctor as told. Contact a doctor if:  You have otitis media only in one ear, or bleeding from your nose, or both.  You notice a lump on your neck.  You are not getting better in 3-5 days.  You feel worse instead of better. Get help right away if:  You have pain that is not helped with medicine.  You have puffiness, redness, or pain around your ear.  You get a stiff neck.  You cannot move part of your face (paralysis).  You notice that the bone behind your ear hurts when you touch it. This information is not intended to replace advice given to you by your health care provider. Make sure you discuss any questions you have with your health care provider. Document Released: 12/16/2007 Document Revised: 12/05/2015 Document Reviewed: 01/24/2013 Elsevier Interactive Patient Education  2017 Elsevier Inc. Sinusitis, Adult Sinusitis is soreness and inflammation of your sinuses. Sinuses are hollow spaces in the bones around your face. They are located:  Around your eyes.  In the middle of your forehead.  Behind your nose.  In your cheekbones.  Your sinuses and nasal passages are lined with a stringy fluid (mucus). Mucus normally drains out of your sinuses. When your nasal tissues get inflamed or swollen, the mucus can get trapped or blocked so air cannot flow through your sinuses. This lets bacteria, viruses, and funguses grow, and that leads to infection. Follow these instructions at home: Medicines  Take, use, or apply over-the-counter  and prescription medicines only as told by your doctor. These may include nasal sprays.  If you were prescribed an antibiotic medicine, take it as told by your doctor. Do not stop taking the antibiotic even if you start to feel better. Hydrate and Humidify  Drink enough water to keep your pee (urine) clear or pale yellow.  Use a cool mist humidifier to keep the humidity level in your home above 50%.  Breathe in steam for 10-15 minutes, 3-4 times a day or as told by your doctor. You can do this in the bathroom while a hot shower is running.  Try not to spend time in cool or dry air. Rest  Rest as much as possible.  Sleep with your head raised (elevated).  Make sure to get enough sleep each night. General instructions  Put a warm, moist washcloth on your face 3-4 times a day or as told by your doctor. This will help with discomfort.  Wash your hands often with soap and water. If there is no soap and water, use hand sanitizer.  Do not smoke. Avoid being around people who are smoking (secondhand smoke).  Keep all follow-up visits as told by your doctor. This is important. Contact a doctor if:  You have a fever.  Your symptoms get worse.  Your symptoms do not get better within 10 days. Get help right away if:  You have a very bad headache.  You cannot stop throwing up (vomiting).  You have pain or swelling around your face or eyes.  You have trouble   seeing.  You feel confused.  Your neck is stiff.  You have trouble breathing. This information is not intended to replace advice given to you by your health care provider. Make sure you discuss any questions you have with your health care provider. Document Released: 12/16/2007 Document Revised: 02/23/2016 Document Reviewed: 04/24/2015 Elsevier Interactive Patient Education  2018 Elsevier Inc.  

## 2017-08-10 NOTE — Progress Notes (Signed)
   Subjective:    Patient ID: Joanna Scott, female    DOB: [birth year], 42 y.o.   MRN: [id number]  HPI 42 yo female in non acute distress.Started about one week ago , nasal congestion discharge yellow /green, and headache on forehead, throbbing. Sore throat, cough mild productive yellow. Allergy testing prick test allergy to  Trees and peaches. 2  1/42 yo son also sick seen by pediatric doctor and placed on Amoxil.  Review of Systems  Constitutional: Positive for fatigue. Negative for chills and fever.  HENT: Positive for congestion, ear pain (left ear hx of  TMP last year), postnasal drip, rhinorrhea, sinus pressure, sinus pain and sore throat. Negative for sneezing.   Eyes: Positive for itching. Negative for discharge.  Respiratory: Positive for cough. Negative for chest tightness and shortness of breath.   Cardiovascular: Negative for chest pain.  Gastrointestinal: Positive for nausea. Negative for abdominal pain, constipation, diarrhea and vomiting.  Musculoskeletal: Negative for myalgias (aching in back upper, no injury per patient).  Skin: Negative for rash.  Allergic/Immunologic: Positive for environmental allergies (trees).  Neurological: Positive for headaches (frontal).  Hematological: Positive for adenopathy (maybe).   Breast feeding    Objective:   Physical Exam  Constitutional: She is oriented to person, place, and time. She appears well-developed and well-nourished.  HENT:  Head: Normocephalic and atraumatic.  Right Ear: Hearing, external ear and ear canal normal. A middle ear effusion is present.  Left Ear: Hearing, external ear and ear canal normal. Tympanic membrane is erythematous.  Nose: Mucosal edema and rhinorrhea present.  Mouth/Throat: Uvula is midline. Posterior oropharyngeal erythema present.    Eyes: Conjunctivae and EOM are normal. Pupils are equal, round, and reactive to light.  Neck: Normal range of motion. Neck supple.  Cardiovascular: Normal rate,  regular rhythm and normal heart sounds.  Pulmonary/Chest: Effort normal and breath sounds normal.  Lymphadenopathy:    She has no cervical adenopathy.  Neurological: She is alert and oriented to person, place, and time.  Skin: Skin is warm and dry.  Psychiatric: She has a normal mood and affect. Her behavior is normal. Judgment and thought content normal.  Nursing note and vitals reviewed.  Erythema more on the left side of throat with  1+ tonsil       Assessment & Plan:  Otitis media/ Sinusitis/ pharyngitis Meds ordered this encounter  Medications  . amoxicillin (AMOXIL) 875 MG tablet    Sig: Take 1 tablet (875 mg total) by mouth 2 (two) times daily.    Dispense:  20 tablet    Refill:  0  return in 3-5 days if not improving . Rest and increase fluids , okay to take Advil take as directed for pain. Patient verbalizes understanding and has no questions at discharge.

## 2017-08-13 ENCOUNTER — Other Ambulatory Visit: Payer: Self-pay

## 2017-08-13 DIAGNOSIS — E282 Polycystic ovarian syndrome: Secondary | ICD-10-CM

## 2017-08-19 ENCOUNTER — Telehealth: Payer: Self-pay

## 2017-08-19 NOTE — Telephone Encounter (Signed)
Pt called to check on lab results from Friday, 2/1; states her PCP has not received results; called [facility] and spoke with [place] and lab is incomplete at this time; states this is a 4 day test and [place]; test results should be back tomorrow by end of business day or at the latest on Monday; called pt to inform her and she was appreciative of phone call and will call us back by the end of the day Monday to see if results are back

## 2017-08-20 LAB — CORTISOL, FREE: Cortisol, Free Dialysis, LCMS: <0.02 ug/dL

## 2017-08-24 ENCOUNTER — Telehealth: Payer: Self-pay

## 2017-08-24 NOTE — Telephone Encounter (Signed)
Pt called to follow up about lab result from 2/1; labs are resulted in Epic and encouraged pt to contact her PCP since she was the ordering physician for the lab and have PCP review with her; pt verb u/o and will call PCP

## 2017-08-25 ENCOUNTER — Ambulatory Visit: Payer: Self-pay | Admitting: Medical

## 2017-08-25 ENCOUNTER — Encounter: Payer: Self-pay | Admitting: Medical

## 2017-08-25 VITALS — BP 126/75 | HR 90 | Temp 97.5°F | Resp 18 | Ht 63.0 in | Wt 249.8 lb

## 2017-08-25 DIAGNOSIS — H6983 Other specified disorders of Eustachian tube, bilateral: Secondary | ICD-10-CM

## 2017-08-25 DIAGNOSIS — R0600 Dyspnea, unspecified: Secondary | ICD-10-CM

## 2017-08-25 DIAGNOSIS — H6993 Unspecified Eustachian tube disorder, bilateral: Secondary | ICD-10-CM

## 2017-08-25 DIAGNOSIS — J0100 Acute maxillary sinusitis, unspecified: Secondary | ICD-10-CM

## 2017-08-25 MED ORDER — AZITHROMYCIN 250 MG PO TABS: ORAL_TABLET | ORAL | 0 refills | Status: DC

## 2017-08-25 NOTE — Progress Notes (Signed)
   Subjective:    Patient ID: Joanna DelayHeidi Scott, female    DOB: [birth year], 42 y.o.   MRN: [id number]  HPI   42 yo female in non acuate distress. With cough, occasionally coughing up light yellow / white mucus. Seen in clinic for  Ottiis Media and sore throat on 08/10/2017. Fiinished her Amoxil. Felt better for  2-3 days. Now has cough productive light yellow at times then white.  Has heard some rattling in her chest. But no Shortness of breath or chest pain. No fever or chills.   Review of Systems  Constitutional: Negative for chills and fever.  HENT: Negative for congestion, sinus pressure, sinus pain and sore throat.   Respiratory: Positive for cough. Negative for chest tightness, shortness of breath and wheezing.   Cardiovascular: Negative for chest pain.   She thinks she had a Z-pak last January while breast feeding due to Pneumonia. After having Influenza.Her child is now 212 yo.    Objective:   Physical Exam  Constitutional: She is oriented to person, place, and time. She appears well-developed and well-nourished.  HENT:  Head: Normocephalic and atraumatic.  Right Ear: Hearing, external ear and ear canal normal. A middle ear effusion is present.  Left Ear: Hearing, external ear and ear canal normal. A middle ear effusion is present.  Nose: Rhinorrhea present. No mucosal edema.  Mouth/Throat: Uvula is midline, oropharynx is clear and moist and mucous membranes are normal.  Eyes: Conjunctivae and EOM are normal. Pupils are equal, round, and reactive to light.  Neck: Normal range of motion. Neck supple.  Cardiovascular: Normal rate, regular rhythm, normal heart sounds and intact distal pulses.  Pulmonary/Chest: Effort normal and breath sounds normal. No respiratory distress. She has no wheezes. She has no rales.  Neurological: She is alert and oriented to person, place, and time.  Skin: Skin is warm and dry.  Psychiatric: She has a normal mood and affect. Her behavior is normal. Judgment  and thought content normal.  Nursing note and vitals reviewed.         Assessment & Plan:  Resolved Otitis media/ Now with  Eustachian tube dysfunction, PND accidentially connected sinusitis with antibiotic, computer was down. Start ChiropractorAllegra or Claritin and Nasocort.  Return in 3-5 days if not improivng or call if you end up starting antibiotics .So we may document in chart.Reviewed signs and symptoms of when to take medication for bacterial infection. Meds ordered this encounter  Medications  . azithromycin (ZITHROMAX) 250 MG tablet    Sig: Take  2 tablets by mouth day 1, then one tablet days 2 -5 take with food.    Dispense:  6 tablet    Refill:  0  She is going to check with her OB/GYN or her pediatrician to make sure she can take Zpak while breast feeding.or she will stop breast feeding while on the Antibiotic. Patient verbalizes understanding and has no questions at discharge.

## 2017-10-12 ENCOUNTER — Ambulatory Visit: Payer: Self-pay | Admitting: Medical

## 2017-10-12 VITALS — BP 152/83 | HR 88 | Resp 18 | Ht 64.0 in | Wt 254.2 lb

## 2017-10-12 DIAGNOSIS — R059 Cough, unspecified: Secondary | ICD-10-CM

## 2017-10-12 DIAGNOSIS — R05 Cough: Secondary | ICD-10-CM

## 2017-10-12 NOTE — Patient Instructions (Signed)
Allergies An allergy is when your body reacts to a substance in a way that is not normal. An allergic reaction can happen after you:  Eat something.  Breathe in something.  Touch something.  You can be allergic to:  Things that are only around during certain seasons, like molds and pollens.  Foods.  Drugs.  Insects.  Animal dander.  What are the signs or symptoms?  Puffiness (swelling). This may happen on the lips, face, tongue, mouth, or throat.  Sneezing.  Coughing.  Breathing loudly (wheezing).  Stuffy nose.  Tingling in the mouth.  A rash.  Itching.  Itchy, red, puffy areas of skin (hives).  Watery eyes.  Throwing up (vomiting).  Watery poop (diarrhea).  Dizziness.  Feeling faint or fainting.  Trouble breathing or swallowing.  A tight feeling in the chest.  A fast heartbeat. How is this diagnosed? Allergies can be diagnosed with:  A medical and family history.  Skin tests.  Blood tests.  A food diary. A food diary is a record of all the foods, drinks, and symptoms you have each day.  The results of an elimination diet. This diet involves making sure not to eat certain foods and then seeing what happens when you start eating them again.  How is this treated? There is no cure for allergies, but allergic reactions can be treated with medicine. Severe reactions usually need to be treated at a hospital. How is this prevented? The best way to prevent an allergic reaction is to avoid the thing you are allergic to. Allergy shots and medicines can also help prevent reactions in some cases. This information is not intended to replace advice given to you by your health care provider. Make sure you discuss any questions you have with your health care provider. Document Released: 10/24/2012 Document Revised: 02/24/2016 Document Reviewed: 04/10/2014 Elsevier Interactive Patient Education  2018 [place]. Cough, Adult A cough helps to clear your  throat and lungs. A cough may last only 2-3 weeks (acute), or it may last longer than 8 weeks (chronic). Many different things can cause a cough. A cough may be a sign of an illness or another medical condition. Follow these instructions at home:  Pay attention to any changes in your cough.  Take medicines only as told by your doctor. ? If you were prescribed an antibiotic medicine, take it as told by your doctor. Do not stop taking it even if you start to feel better. ? Talk with your doctor before you try using a cough medicine.  Drink enough fluid to keep your pee (urine) clear or pale yellow.  If the air is dry, use a cold steam vaporizer or humidifier in your home.  Stay away from things that make you cough at work or at home.  If your cough is worse at night, try using extra pillows to raise your head up higher while you sleep.  Do not smoke, and try not to be around smoke. If you need help quitting, ask your doctor.  Do not have caffeine.  Do not drink alcohol.  Rest as needed. Contact a doctor if:  You have new problems (symptoms).  You cough up yellow fluid (pus).  Your cough does not get better after 2-3 weeks, or your cough gets worse.  Medicine does not help your cough and you are not sleeping well.  You have pain that gets worse or pain that is not helped with medicine.  You have a fever.  You  are losing weight and you do not know why.  You have night sweats. Get help right away if:  You cough up blood.  You have trouble breathing.  Your heartbeat is very fast. This information is not intended to replace advice given to you by your health care provider. Make sure you discuss any questions you have with your health care provider. Document Released: 03/12/2011 Document Revised: 12/05/2015 Document Reviewed: 09/05/2014 Elsevier Interactive Patient Education  [employer]2018 Elsevier Inc.

## 2017-10-12 NOTE — Progress Notes (Signed)
Subjective:    Patient ID: Joanna DelayHeidi Scott, female    DOB: [birth year], 42 y.o.   MRN: [id number]  HPI 42 yo female in non acute distress.   Ate an apple at   2:15 pm and then 15 min afterwards had burning lips , sharp pain in stomache and distention of stomach.  Thinks she is having an allergic reaction ( allergy to peaches and birch trees)  Offered patient Zantac and Claritin for allergic reaction. Denies Nausea or Respiratory problems or trouble swallowing. Marland Kitchen.  Apple tasted fine "Ambrosia  Apple" a new kind apple the  patient tried  Had patient rinse out mouth. Patient drank a whole bottle of water. Gargled and rinsed out her throat.  Appointment made for her cough but then needed to be seen patient for allergic reaction. Currently breast feeding.  Review of Systems  Constitutional: Negative for chills and fever.  HENT: Positive for congestion, ear pain (left), sinus pressure, sinus pain (forehead and maxillary) and sore throat. Negative for rhinorrhea.   Eyes: Negative for discharge and itching.  Respiratory: Positive for cough (productive feels it rattle in chest) and shortness of breath. Negative for chest tightness and wheezing.   Cardiovascular: Negative for chest pain (with couging) and leg swelling.  Gastrointestinal: Positive for abdominal pain (she says from the apple, epigastric area.). Negative for constipation, diarrhea, nausea and vomiting.  Endocrine: Negative for polydipsia, polyphagia and polyuria.  Genitourinary: Negative for dysuria.  Musculoskeletal: Negative for myalgias.  Skin: Negative for rash.  Allergic/Immunologic: Positive for environmental allergies (most trees and oak trees) and food allergies.  Neurological: Positive for dizziness and headaches. Negative for syncope and light-headedness.  Hematological: Negative for adenopathy.  Psychiatric/Behavioral: Negative for behavioral problems, self-injury and suicidal ideas.   Changed from Zoloft to Trintellix over  the last  3 week. Has been  7-8 days on just the Trintellix takes  medication for depression and anxiety.[facility] by [facility].  History of  Pneumonia  5 years ago hospitalized  1 day. History of  pnumonia 2-3 times without hospitalization.   no coughing up of blood currently.      Objective:   Physical Exam  Constitutional: She is oriented to person, place, and time. She appears well-developed and well-nourished.  HENT:  Head: Normocephalic and atraumatic.  Right Ear: External ear normal.  Left Ear: External ear normal.  Mouth/Throat: Oropharynx is clear and moist.  Eyes: Pupils are equal, round, and reactive to light. Conjunctivae and EOM are normal.  Neck: Normal range of motion. Neck supple. No erythema present.  Cardiovascular: Normal rate, regular rhythm and normal heart sounds.  Pulmonary/Chest: Effort normal and breath sounds normal. No respiratory distress. She has no wheezes. She has no rales.  Abdominal: Soft. Bowel sounds are normal. She exhibits no distension. There is tenderness (epigastric area). There is no rebound and no guarding.  Lymphadenopathy:    She has no cervical adenopathy.  Neurological: She is alert and oriented to person, place, and time.  Skin: Skin is warm and dry.  Psychiatric: She has a normal mood and affect. Her behavior is normal. Judgment and thought content normal.  Nursing note and vitals reviewed.  Patent starting to panic in room due to allergy symptoms. Calmed patient down.    Cough noted in room, no throat swelling , no facial or lip or tongue swelling. Lungs CTAB.  obese     Assessment & Plan:  Allergic reaction  Possibly from an apple. ( possibly cross  contaminated with peaches), no throat or angioedema, no problems breathing, no rash. Burning in mouth much improved with water rinse.Stomach pain improved with medication.  Cough, currently patient is breast feeding.Recommended Chest x-ray due to prior history of  pneumonia and patient still not well. Will contact patient once I get results.  Treated with Zantac 150 mg  [id number] exp 7/20 And Claritin 10 mg 6022 exp  11/2018 in clinic.  She did checked with  OB.GYN on antibiotics of Z Pak ,  Patient finished antibiotics.   Patient to follow up with her Pediatric doctor to see what she can take for cough with breast feeding.  Patient feeling better overall at end of visit. Patient verbalizes understanding and has no questions at discharge.

## 2017-10-13 ENCOUNTER — Ambulatory Visit
Admission: RE | Admit: 2017-10-13 | Discharge: 2017-10-13 | Disposition: A | Discharge status: Home / Self Care | Payer: BLUE CROSS/BLUE SHIELD | Source: Ambulatory Visit | Attending: Medical | Admitting: Medical

## 2017-10-13 ENCOUNTER — Telehealth: Payer: Self-pay | Admitting: Adult Health

## 2017-10-13 DIAGNOSIS — R05 Cough: Secondary | ICD-10-CM | POA: Diagnosis not present

## 2017-10-13 DIAGNOSIS — K449 Diaphragmatic hernia without obstruction or gangrene: Secondary | ICD-10-CM | POA: Insufficient documentation

## 2017-10-13 DIAGNOSIS — R059 Cough, unspecified: Secondary | ICD-10-CM

## 2017-10-13 NOTE — Telephone Encounter (Signed)
10/13/17 4:35pm and 4:37pm.  Attempted to call patient x 2 with x ray of chest result from visit earlier today with Allean FoundHeather Ratcliff PA-C. No answer and left message on second call to return call to office.   Chest x ray results as below:   CLINICAL DATA:  Productive cough, shortness of breath, and mid chest pain for 2 months.  EXAM: CHEST - 2 VIEW  COMPARISON:  03/22/2016  FINDINGS: The heart size and mediastinal contours are within normal limits. No evidence of pulmonary infiltrate or pleural effusion. Small hiatal hernia again noted which appears stable.  IMPRESSION: No active cardiopulmonary disease.  Small hiatal hernia.   Electronically Signed   By: Myles RosenthalJohn  Stahl M.D.   On: 10/13/2017 13:49  Will attempt to call again on 10/14/2017 if no return call from patient.

## 2017-10-14 ENCOUNTER — Telehealth: Payer: Self-pay | Admitting: Adult Health

## 2017-10-14 NOTE — Telephone Encounter (Signed)
10/14/17- Attempted to call patient a second time to follow up and review chest x ray results. No answer x 2 attempts. Left generic message to return call to this office at [phone] [facility]

## 2017-10-14 NOTE — Telephone Encounter (Signed)
Patient returned call on 10/14/17 on  11:59pm. She declines any worsening symptoms since last seen in the office by Allean FoundHeather Ratcliff PA- C colleague. She denies any other symptoms besides the generalized abdominal tenderness she had at office visit and reports this is unchanged from time of visit. She feels her allergic symptoms have resolved.  Patient  denies any fever, body aches,chills, rash, chest pain, shortness of breath, nausea, vomiting, or diarrhea. Denies constipation. X ray results reviewed as below-Small hiatal hernia again noted which appears stable. IMPRESSION: No active cardiopulmonary disease. Small hiatal hernia.  Patient reports she believes she was told she had a hiatal hernia when she was seen by her gastroenterologist at the time of her UPPER GI procedure. She is advised to call her primary care  physician for follow up today. Also advised to follow up with Gastrointestinal MD if recommended by PCP or if any symptoms persist or worsen.  Provider also recommends EPI- PEN and offers to send in script, patient declines and reports she is scheduling an appointment today to discuss and will request a EPI - PEN at that time. Advised to call office back if any further questions or concerns.   Advised patient call the office or your primary care doctor today for follow up appointment today 10/14/17. Advised ER or urgent Care if after hours or on weekend. Call 911 for emergency symptoms at any time.Patinet verbalized understanding of all instructions given/reviewed and treatment plan and has no further questions or concerns at this time.     Chest x ray results as below:   CLINICAL DATA: Productive cough, shortness of breath, and mid chest pain for 2 months.  EXAM: CHEST - 2 VIEW  COMPARISON: 03/22/2016  FINDINGS: The heart size and mediastinal contours are within normal limits. No evidence of pulmonary infiltrate or pleural effusion. Small hiatal hernia again noted which  appears stable.  IMPRESSION: No active cardiopulmonary disease. Small hiatal hernia.   Electronically Signed By: Myles RosenthalJohn Stahl M.D. On: 10/13/2017 13:49  Will attempt to call again on 10/14/2017 if no return call from patient.

## 2017-10-15 ENCOUNTER — Telehealth: Payer: Self-pay | Admitting: Adult Health

## 2017-10-15 NOTE — Telephone Encounter (Signed)
Error - no additional contact made

## 2018-01-28 ENCOUNTER — Ambulatory Visit: Payer: Self-pay | Admitting: Adult Health

## 2018-01-28 ENCOUNTER — Encounter: Payer: Self-pay | Admitting: Adult Health

## 2018-01-28 VITALS — BP 140/95 | HR 76 | Temp 98.9°F | Resp 16 | Ht 64.0 in | Wt 257.0 lb

## 2018-01-28 DIAGNOSIS — H00012 Hordeolum externum right lower eyelid: Secondary | ICD-10-CM

## 2018-01-28 DIAGNOSIS — H60332 Swimmer's ear, left ear: Secondary | ICD-10-CM

## 2018-01-28 DIAGNOSIS — H00015 Hordeolum externum left lower eyelid: Secondary | ICD-10-CM

## 2018-01-28 DIAGNOSIS — J0100 Acute maxillary sinusitis, unspecified: Secondary | ICD-10-CM

## 2018-01-28 MED ORDER — NEOMYCIN-POLYMYXIN-HC 3.5-10000-1 OT SOLN: 4.0000 [drp] | Freq: Three times a day (TID) | OTIC | 0 refills | Status: DC

## 2018-01-28 MED ORDER — BACITRACIN 500 UNIT/GM OP OINT: 1.0000 "application " | TOPICAL_OINTMENT | Freq: Three times a day (TID) | OPHTHALMIC | 0 refills | Status: DC

## 2018-01-28 MED ORDER — AMOXICILLIN-POT CLAVULANATE 875-125 MG PO TABS: 1.0000 | ORAL_TABLET | Freq: Two times a day (BID) | ORAL | 0 refills | Status: DC

## 2018-01-28 NOTE — Patient Instructions (Signed)
Stye A stye is a bump on your eyelid caused by a bacterial infection. A stye can form inside the eyelid (internal stye) or outside the eyelid (external stye). An internal stye may be caused by an infected oil-producing gland inside your eyelid. An external stye may be caused by an infection at the base of your eyelash (hair follicle). Styes are very common. Anyone can get them at any age. They usually occur in just one eye, but you may have more than one in either eye. What are the causes? The infection is almost always caused by bacteria called Staphylococcus aureus. This is a common type of bacteria that lives on your skin. What increases the risk? You may be at higher risk for a stye if you have had one before. You may also be at higher risk if you have:  Diabetes.  Long-term illness.  Long-term eye redness.  A skin condition called seborrhea.  High fat levels in your blood (lipids).  What are the signs or symptoms? Eyelid pain is the most common symptom of a stye. Internal styes are more painful than external styes. Other signs and symptoms may include:  Painful swelling of your eyelid.  A scratchy feeling in your eye.  Tearing and redness of your eye.  Pus draining from the stye.  How is this diagnosed? Your health care provider may be able to diagnose a stye just by examining your eye. The health care provider may also check to make sure:  You do not have a fever or other signs of a more serious infection.  The infection has not spread to other parts of your eye or areas around your eye.  How is this treated? Most styes will clear up in a few days without treatment. In some cases, you may need to use antibiotic drops or ointment to prevent infection. Your health care provider may have to drain the stye surgically if your stye is:  Large.  Causing a lot of pain.  Interfering with your vision.  This can be done using a thin blade or a needle. Follow these  instructions at home:  Take medicines only as directed by your health care provider.  Apply a clean, warm compress to your eye for 10 minutes, 4 times a day.  Do not wear contact lenses or eye makeup until your stye has healed.  Do not try to pop or drain the stye. Contact a health care provider if:  You have chills or a fever.  Your stye does not go away after several days.  Your stye affects your vision.  Your eyeball becomes swollen, red, or painful. This information is not intended to replace advice given to you by your health care provider. Make sure you discuss any questions you have with your health care provider. Document Released: 04/08/2005 Document Revised: 02/23/2016 Document Reviewed: 10/13/2013 Elsevier Interactive Patient Education  2018 Elsevier Inc. Amoxicillin; Clavulanic Acid tablets What is this medicine? AMOXICILLIN; CLAVULANIC ACID (a mox i SIL in; KLAV yoo lan ic AS id) is a penicillin antibiotic. It is used to treat certain kinds of bacterial infections. It will not work for colds, flu, or other viral infections. This medicine may be used for other purposes; ask your health care provider or pharmacist if you have questions. COMMON BRAND NAME(S): Augmentin What should I tell my health care provider before I take this medicine? They need to know if you have any of these conditions: -bowel disease, like colitis -kidney disease -liver disease -  mononucleosis -an unusual or allergic reaction to amoxicillin, penicillin, cephalosporin, other antibiotics, clavulanic acid, other medicines, foods, dyes, or preservatives -pregnant or trying to get pregnant -breast-feeding How should I use this medicine? Take this medicine by mouth with a full glass of water. Follow the directions on the prescription label. Take at the start of a meal. Do not crush or chew. If the tablet has a score line, you may cut it in half at the score line for easier swallowing. Take your  medicine at regular intervals. Do not take your medicine more often than directed. Take all of your medicine as directed even if you think you are better. Do not skip doses or stop your medicine early. Talk to your pediatrician regarding the use of this medicine in children. Special care may be needed. Overdosage: If you think you have taken too much of this medicine contact a poison control center or emergency room at once. NOTE: This medicine is only for you. Do not share this medicine with others. What if I miss a dose? If you miss a dose, take it as soon as you can. If it is almost time for your next dose, take only that dose. Do not take double or extra doses. What may interact with this medicine? -allopurinol -anticoagulants -birth control pills -methotrexate -probenecid This list may not describe all possible interactions. Give your health care provider a list of all the medicines, herbs, non-prescription drugs, or dietary supplements you use. Also tell them if you smoke, drink alcohol, or use illegal drugs. Some items may interact with your medicine. What should I watch for while using this medicine? Tell your doctor or health care professional if your symptoms do not improve. Do not treat diarrhea with over the counter products. Contact your doctor if you have diarrhea that lasts more than 2 days or if it is severe and watery. If you have diabetes, you may get a false-positive result for sugar in your urine. Check with your doctor or health care professional. Birth control pills may not work properly while you are taking this medicine. Talk to your doctor about using an extra method of birth control. What side effects may I notice from receiving this medicine? Side effects that you should report to your doctor or health care professional as soon as possible: -allergic reactions like skin rash, itching or hives, swelling of the face, lips, or tongue -breathing problems -dark  urine -fever or chills, sore throat -redness, blistering, peeling or loosening of the skin, including inside the mouth -seizures -trouble passing urine or change in the amount of urine -unusual bleeding, bruising -unusually weak or tired -white patches or sores in the mouth or throat Side effects that usually do not require medical attention (report to your doctor or health care professional if they continue or are bothersome): -diarrhea -dizziness -headache -nausea, vomiting -stomach upset -vaginal or anal irritation This list may not describe all possible side effects. Call your doctor for medical advice about side effects. You may report side effects to FDA at 1-800-FDA-1088. Where should I keep my medicine? Keep out of the reach of children. Store at room temperature below 25 degrees C (77 degrees F). Keep container tightly closed. Throw away any unused medicine after the expiration date. NOTE: This sheet is a summary. It may not cover all possible information. If you have questions about this medicine, talk to your doctor, pharmacist, or health care provider.  2018 Elsevier/Gold Standard (2007-09-22 12:04:30) Sinusitis, Adult Sinusitis is soreness  and inflammation of your sinuses. Sinuses are hollow spaces in the bones around your face. They are located:  Around your eyes.  In the middle of your forehead.  Behind your nose.  In your cheekbones.  Your sinuses and nasal passages are lined with a stringy fluid (mucus). Mucus normally drains out of your sinuses. When your nasal tissues get inflamed or swollen, the mucus can get trapped or blocked so air cannot flow through your sinuses. This lets bacteria, viruses, and funguses grow, and that leads to infection. Follow these instructions at home: Medicines  Take, use, or apply over-the-counter and prescription medicines only as told by your doctor. These may include nasal sprays.  If you were prescribed an antibiotic medicine,  take it as told by your doctor. Do not stop taking the antibiotic even if you start to feel better. Hydrate and Humidify  Drink enough water to keep your pee (urine) clear or pale yellow.  Use a cool mist humidifier to keep the humidity level in your home above 50%.  Breathe in steam for 10-15 minutes, 3-4 times a day or as told by your doctor. You can do this in the bathroom while a hot shower is running.  Try not to spend time in cool or dry air. Rest  Rest as much as possible.  Sleep with your head raised (elevated).  Make sure to get enough sleep each night. General instructions  Put a warm, moist washcloth on your face 3-4 times a day or as told by your doctor. This will help with discomfort.  Wash your hands often with soap and water. If there is no soap and water, use hand sanitizer.  Do not smoke. Avoid being around people who are smoking (secondhand smoke).  Keep all follow-up visits as told by your doctor. This is important. Contact a doctor if:  You have a fever.  Your symptoms get worse.  Your symptoms do not get better within 10 days. Get help right away if:  You have a very bad headache.  You cannot stop throwing up (vomiting).  You have pain or swelling around your face or eyes.  You have trouble seeing.  You feel confused.  Your neck is stiff.  You have trouble breathing. This information is not intended to replace advice given to you by your health care provider. Make sure you discuss any questions you have with your health care provider. Document Released: 12/16/2007 Document Revised: 02/23/2016 Document Reviewed: 04/24/2015 Elsevier Interactive Patient Education  2018 [employer] Otitis Externa Otitis externa is an infection of the outer ear canal. The outer ear canal is the area between the outside of the ear and the eardrum. Otitis externa is sometimes called "swimmer's ear." Follow these instructions at home:  If you were given  antibiotic ear drops, use them as told by your doctor. Do not stop using them even if your condition gets better.  Take over-the-counter and prescription medicines only as told by your doctor.  Keep all follow-up visits as told by your doctor. This is important. How is this prevented?  Keep your ear dry. Use the corner of a towel to dry your ear after you swim or bathe.  Try not to scratch or put things in your ear. Doing these things makes it easier for germs to grow in your ear.  Avoid swimming in lakes, dirty water, or pools that may not have the right amount of a chemical called chlorine.  Consider making ear drops and putting  3 or 4 drops in each ear after you swim. Ask your doctor about how you can make ear drops. Contact a doctor if:  You have a fever.  After 3 days your ear is still red, swollen, or painful.  After 3 days you still have pus coming from your ear.  Your redness, swelling, or pain gets worse.  You have a really bad headache.  You have redness, swelling, pain, or tenderness behind your ear. This information is not intended to replace advice given to you by your health care provider. Make sure you discuss any questions you have with your health care provider. Document Released: 12/16/2007 Document Revised: 07/25/2015 Document Reviewed: 04/08/2015 Elsevier Interactive Patient Education  [employer].

## 2018-01-28 NOTE — Progress Notes (Signed)
Vision Exam 20/30 in both eyes with glasses.

## 2018-01-28 NOTE — Progress Notes (Addendum)
Subjective:     Patient ID: Joanna Scott, female   DOB: 1976-01-15, 42 y.o.   MRN: [id number]  Temperature 98.9 F (37.2 C), resp. rate 16, height 5\' 4"  (1.626 m), weight 257 lb (116.6 kg), last menstrual period 12/14/2017, currently breastfeeding. B/P 150/90 Eye Problem   The left (left eye worse than right- right eye lid mild swelling per patinet. ) eye is affected. This is a new problem. The current episode started in the past 7 days (2 days ago ). The problem occurs constantly. The problem has been gradually worsening. The pain is at a severity of 0/10. The patient is experiencing no pain. There is no known exposure to pink eye. She does not wear contacts. Associated symptoms include an eye discharge and itching. Pertinent negatives include no blurred vision, double vision, eye redness, fever, foreign body sensation, nausea, photophobia, recent URI or vomiting. Treatments tried: wet tears  The treatment provided mild relief.   Denies eye pain or visual changes.   She reports also sinus drainage and congestion ,left ear feels " under water " last 2 weeks. She reports she was at the beach last week swimming. Denies any ear drainage.   Patient  denies any fever, body aches,chills, rash, chest pain, shortness of breath, nausea, vomiting, or diarrhea.   Patient's last menstrual period was 12/14/2017 (approximate). Denies any chance of pregnancy she reports not sexually active at this time. She has had irregular cycle.Breastfeeding at night 71 1/42 year old.    She has 11 and 42 year old boys. No recent illness.   Review of Systems  Constitutional: Negative for fever.  Eyes: Positive for discharge and itching. Negative for blurred vision, double vision, photophobia and redness.  Gastrointestinal: Negative for nausea and vomiting.       Objective:   Physical Exam  Constitutional: She is oriented to person, place, and time. Vital signs are normal. She appears well-developed and well-nourished.  She is active.  Non-toxic appearance. She does not have a sickly appearance. She does not appear ill. No distress.  Patient is alert and oriented and responsive to questions Engages in eye contact with provider. Speaks in full sentences without any pauses without any shortness of breath or distress.    HENT:  Head: Normocephalic and atraumatic.  Right Ear: Hearing, tympanic membrane, external ear and ear canal normal. Tympanic membrane is not perforated and not erythematous.  Left Ear: Hearing, tympanic membrane and ear canal normal. No lacerations. There is swelling (internal ear canal with mild swelling and small amount of white dry skin in canal only- tympanicmembrane able to visualize and normal in apperance) and tenderness (with tragal pull. ). No drainage. No foreign bodies. No mastoid tenderness. Tympanic membrane is not injected, not scarred, not perforated, not erythematous, not retracted and not bulging. Tympanic membrane mobility is normal.  No middle ear effusion. No hemotympanum. No decreased hearing is noted.  Nose: Mucosal edema and rhinorrhea present. Right sinus exhibits maxillary sinus tenderness. Right sinus exhibits no frontal sinus tenderness. Left sinus exhibits maxillary sinus tenderness. Left sinus exhibits no frontal sinus tenderness.  Mouth/Throat: Uvula is midline and mucous membranes are normal. No uvula swelling. Posterior oropharyngeal erythema present. No oropharyngeal exudate, posterior oropharyngeal edema or tonsillar abscesses. Tonsils are 1+ on the right. Tonsils are 1+ on the left. No tonsillar exudate.  Eyes: Pupils are equal, round, and reactive to light. Conjunctivae, EOM and lids are normal. Lids are everted and swept, no foreign bodies found. Right eye exhibits  hordeolum. Right eye exhibits no chemosis, no discharge and no exudate. No foreign body present in the right eye. Left eye exhibits hordeolum. Left eye exhibits no chemosis, no discharge and no exudate (small  amount of yellow discharge near eyelash.). No foreign body present in the left eye. No scleral icterus.    Areas of bilateral hordeolum marked on diagram. Mild erythema over area of hordeolum only with mild swelling localized only at areas of hordeolum.. No spreading cellulitis or periorbital pain or swelling.   Conjunctiva normal.   Vision 20/30 bilateral eyes with glasses per nursing Armando Gangracy Greene RN.   Neck: Trachea normal, normal range of motion, full passive range of motion without pain and phonation normal. Neck supple. Normal carotid pulses, no hepatojugular reflux and no JVD present. No tracheal tenderness present. Carotid bruit is not present. No tracheal deviation present. No Brudzinski's sign noted.  Cardiovascular: Normal rate, regular rhythm, normal heart sounds and intact distal pulses. Exam reveals no gallop and no friction rub.  No murmur heard. Pulmonary/Chest: Effort normal and breath sounds normal. No stridor. No respiratory distress. She has no wheezes. She has no rales. She exhibits no tenderness.  Abdominal: Soft. Bowel sounds are normal.  Musculoskeletal: Normal range of motion.  Lymphadenopathy:       Head (right side): No submental, no submandibular, no tonsillar, no preauricular, no posterior auricular and no occipital adenopathy present.       Head (left side): No submental, no submandibular, no tonsillar, no preauricular, no posterior auricular and no occipital adenopathy present.    She has no cervical adenopathy.    She has no axillary adenopathy.  Neurological: She is alert and oriented to person, place, and time. She displays normal reflexes. No cranial nerve deficit. She exhibits normal muscle tone. Coordination normal.  Skin: Skin is warm, dry and intact. Capillary refill takes less than 2 seconds. No rash noted. She is not diaphoretic. No erythema. No pallor.  Psychiatric: She has a normal mood and affect. Her speech is normal and behavior is normal. Judgment  and thought content normal. Cognition and memory are normal.  Vitals reviewed.      Assessment:     Hordeolum externum of left lower eyelid  Hordeolum externum of right lower eyelid  Acute non-recurrent maxillary sinusitis  Acute swimmer's ear of left side      Plan:     Discussed mild hypertension and need for follow up with PCP if continue after feeling well.     Meds ordered this encounter  Medications  . neomycin-polymyxin-hydrocortisone (CORTISPORIN) OTIC solution    Sig: Place 4 drops into the left ear 3 (three) times daily.    Dispense:  10 mL    Refill:  0  . amoxicillin-clavulanate (AUGMENTIN) 875-125 MG tablet    Sig: Take 1 tablet by mouth 2 (two) times daily.    Dispense:  20 tablet    Refill:  0  . bacitracin ophthalmic ointment    Sig: Place 1 application into both eyes 3 (three) times daily. Apply thin ribbon  to eye bilateral only to stye areas    Dispense:  3.5 g    Refill:  0    Please explain how to apply   Warm compressed to bilateral eye three to four times daily. Seek care right away if spreading redness or any increased swelling, eye pain or visual changes.  Advised patient call the office or your primary care doctor for an appointment if no improvement within 72  hours or if any symptoms change or worsen at any time  Advised ER or urgent Care if after hours or on weekend. Call 911 for emergency symptoms at any time.Patinet verbalized understanding of all instructions given/reviewed and treatment plan and has no further questions or concerns at this time.    Patient verbalized understanding of all instructions given and denies any further questions at this time.

## 2018-04-12 DIAGNOSIS — G4733 Obstructive sleep apnea (adult) (pediatric): Secondary | ICD-10-CM | POA: Diagnosis not present

## 2018-04-19 DIAGNOSIS — Z713 Dietary counseling and surveillance: Secondary | ICD-10-CM | POA: Diagnosis not present

## 2018-04-19 DIAGNOSIS — R7309 Other abnormal glucose: Secondary | ICD-10-CM | POA: Diagnosis not present

## 2018-04-19 DIAGNOSIS — E785 Hyperlipidemia, unspecified: Secondary | ICD-10-CM | POA: Diagnosis not present

## 2018-04-19 DIAGNOSIS — F331 Major depressive disorder, recurrent, moderate: Secondary | ICD-10-CM | POA: Diagnosis not present

## 2018-06-16 DIAGNOSIS — F3342 Major depressive disorder, recurrent, in full remission: Secondary | ICD-10-CM | POA: Diagnosis not present

## 2018-06-16 DIAGNOSIS — H6532 Chronic mucoid otitis media, left ear: Secondary | ICD-10-CM | POA: Diagnosis not present

## 2018-06-16 DIAGNOSIS — E785 Hyperlipidemia, unspecified: Secondary | ICD-10-CM | POA: Diagnosis not present

## 2018-06-16 DIAGNOSIS — N921 Excessive and frequent menstruation with irregular cycle: Secondary | ICD-10-CM | POA: Diagnosis not present

## 2018-06-23 ENCOUNTER — Other Ambulatory Visit: Payer: Self-pay | Admitting: Nurse Practitioner

## 2018-06-23 DIAGNOSIS — N921 Excessive and frequent menstruation with irregular cycle: Secondary | ICD-10-CM

## 2018-07-18 ENCOUNTER — Ambulatory Visit: Payer: Self-pay | Admitting: Medical

## 2018-07-18 ENCOUNTER — Encounter: Payer: Self-pay | Admitting: Medical

## 2018-07-18 VITALS — BP 155/93 | HR 97 | Temp 99.7°F | Resp 18 | Wt 251.0 lb

## 2018-07-18 DIAGNOSIS — J029 Acute pharyngitis, unspecified: Secondary | ICD-10-CM

## 2018-07-18 DIAGNOSIS — J0100 Acute maxillary sinusitis, unspecified: Secondary | ICD-10-CM

## 2018-07-18 DIAGNOSIS — H9202 Otalgia, left ear: Secondary | ICD-10-CM

## 2018-07-18 MED ORDER — AMOXICILLIN-POT CLAVULANATE 875-125 MG PO TABS
1.0000 | ORAL_TABLET | Freq: Two times a day (BID) | ORAL | 0 refills | Status: DC
Start: 2018-07-18 — End: 2018-09-12

## 2018-07-18 NOTE — Progress Notes (Signed)
Subjective:    Patient ID: Joanna DelayHeidi Scott, female    DOB: [birth year], 43 y.o.   MRN: [id number]  HPI  43 yo female in non acute distress. Started one week ago while traveling,  With sore throat with cough productive green and yellow. and headache. Unknown fever but has had chills  And fatigue x 2 days.Some body aches.  Some  shortness of breath but no  chest pain.    Review of Systems  Constitutional: Positive for chills and fatigue. Negative for fever.  HENT: Positive for congestion, ear pain (left), postnasal drip, rhinorrhea, sinus pressure, sinus pain, sore throat and trouble swallowing. Negative for sneezing.   Eyes: Negative for visual disturbance.  Respiratory: Positive for cough and shortness of breath (trouble taking a deep breath).   Cardiovascular: Negative for chest pain.  Gastrointestinal: Negative for diarrhea, nausea and vomiting.  Genitourinary: Negative for dysuria and hematuria.  Musculoskeletal: Positive for myalgias.  Skin: Negative for rash.  Allergic/Immunologic: Positive for environmental allergies and food allergies.  Neurological: Positive for dizziness (07/15/2018) and headaches. Negative for syncope and light-headedness.  Hematological: Negative for adenopathy.  Psychiatric/Behavioral: Negative for behavioral problems, confusion, self-injury and suicidal ideas.       Objective:   Physical Exam Vitals signs and nursing note reviewed.  Constitutional:      Appearance: Normal appearance.  HENT:     Head: Normocephalic and atraumatic.     Jaw: There is normal jaw occlusion.     Salivary Glands: Right salivary gland is not diffusely enlarged or tender. Left salivary gland is not diffusely enlarged or tender.     Right Ear: Hearing, ear canal and external ear normal. A middle ear effusion is present.     Left Ear: Hearing and ear canal normal. A middle ear effusion is present.     Nose: Mucosal edema (left side) present.     Right Turbinates: Not enlarged,  swollen or pale.     Left Turbinates: Enlarged and swollen (mild). Not pale.     Mouth/Throat:     Lips: Pink.     Mouth: Mucous membranes are moist.     Pharynx: Posterior oropharyngeal erythema and uvula swelling (mild) present. No oropharyngeal exudate.  Eyes:     Extraocular Movements: Extraocular movements intact.     Conjunctiva/sclera: Conjunctivae normal.     Pupils: Pupils are equal, round, and reactive to light.  Neck:     Musculoskeletal: Normal range of motion and neck supple.  Cardiovascular:     Rate and Rhythm: Normal rate and regular rhythm.     Heart sounds: Normal heart sounds.  Pulmonary:     Effort: Pulmonary effort is normal.     Breath sounds: Normal breath sounds.  Lymphadenopathy:     Cervical: No cervical adenopathy.  Skin:    General: Skin is warm and dry.  Neurological:     General: No focal deficit present.     Mental Status: She is alert and oriented to person, place, and time.  Psychiatric:        Mood and Affect: Mood normal.        Behavior: Behavior normal.        Thought Content: Thought content normal.        Judgment: Judgment normal.     No cough in room      Assessment & Plan:  Pharyngitis Sinusitis Left ear otalgia Meds ordered this encounter  Medications  . amoxicillin-clavulanate (AUGMENTIN) 875-125 MG tablet  Sig: Take 1 tablet by mouth 2 (two) times daily.    Dispense:  20 tablet    Refill:  0  Return in  3-5 days if not improving. Increase water intake and treat not feeling well or  Fever with OTC Motrin or Tylenol per package directions.  Patient declines AVS and verbalizes understanding and has no questions at discharage.

## 2018-07-21 ENCOUNTER — Ambulatory Visit
Admission: RE | Admit: 2018-07-21 | Discharge: 2018-07-21 | Disposition: A | Discharge status: Home / Self Care | Payer: BLUE CROSS/BLUE SHIELD | Source: Ambulatory Visit | Attending: Nurse Practitioner | Admitting: Nurse Practitioner

## 2018-07-21 DIAGNOSIS — N921 Excessive and frequent menstruation with irregular cycle: Secondary | ICD-10-CM | POA: Diagnosis not present

## 2018-07-21 DIAGNOSIS — N920 Excessive and frequent menstruation with regular cycle: Secondary | ICD-10-CM | POA: Diagnosis not present

## 2018-09-12 ENCOUNTER — Encounter: Payer: Self-pay | Admitting: Nurse Practitioner

## 2018-09-12 ENCOUNTER — Ambulatory Visit: Payer: Self-pay | Admitting: Nurse Practitioner

## 2018-09-12 ENCOUNTER — Other Ambulatory Visit: Payer: Self-pay

## 2018-09-12 VITALS — BP 120/70 | HR 108 | Temp 100.0°F | Resp 18 | Wt 248.8 lb

## 2018-09-12 DIAGNOSIS — H65192 Other acute nonsuppurative otitis media, left ear: Secondary | ICD-10-CM

## 2018-09-12 DIAGNOSIS — J3089 Other allergic rhinitis: Secondary | ICD-10-CM

## 2018-09-12 NOTE — Patient Instructions (Addendum)
Non-seasonal allergic rhinitis due to other allergic trigger  Other non-recurrent acute nonsuppurative otitis media of left ear  Please resume Nasacort as directed; flush sinuses prior to sue with normal saline daily Start over the counter Zyrtec We discussed your Metformin med on the chart and you informed confirmed it was for PCOS; please follow up with provider regarding this medication that you are currently not taking Encouraged patient to call the office or primary care doctor for an appointment if no improvement in symptoms or if symptoms change or worsen after 72 hours of planned treatment. Patient verbalized understanding of all instructions given/reviewed and has no further questions or concerns at this time.

## 2018-09-12 NOTE — Progress Notes (Signed)
   Subjective:    Patient ID: Joanna Scott, female    DOB: 01/11/1976, 43 y.o.   MRN: [id number]  HPIHeidi comes to the [facility] and wellness clinic today with c/o left ear pain, nasal and chest congestion which she has been coughing up green sputum x 2 days and cough. She reports these symptoms have been going on since last OV 07/2018 and completed course of treatment as directed. Denies fever, sore throat, facial pain. She has not done any self treatment except Advil some times and doesn't take flonase routinely.  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  Discussed metformin on med list; which she reports she's not taking. and b/p elevated in office last 2 OV and reports her PCP is working on this and she is to follow up next week.    Review of Systems  Constitutional: Negative for fatigue and fever.  HENT: Positive for congestion. Negative for sinus pressure, sinus pain and sore throat.   Respiratory: Positive for cough.        Objective:   Physical Exam Vitals signs reviewed.  Constitutional:      Appearance: Normal appearance. She is well-developed.  HENT:     Head: Normocephalic and atraumatic.     Comments: No maxillary or frontal sinus tenderness    Right Ear: Ear canal normal.     Left Ear: Ear canal normal.     Ears:     Comments: Left intact TM with no erythema and serous fluid noted, right similar but left worse    Nose: No rhinorrhea.     Comments: Right inferior turbinate erythematous and boggy with light yellow discharge sitting on mucosa. Left with erythema.    Mouth/Throat:     Mouth: Mucous membranes are moist.     Comments: mildy injected pharynx Neck:     Musculoskeletal: Normal range of motion and neck supple.  Cardiovascular:     Rate and Rhythm: Normal rate and regular rhythm.     Heart sounds: Normal heart sounds.  Pulmonary:     Effort: Pulmonary effort is normal. No respiratory distress.     Breath sounds: Normal  breath sounds. No wheezing or rhonchi.  Abdominal:     General: Bowel sounds are normal.     Palpations: Abdomen is soft.  Musculoskeletal: Normal range of motion.  Lymphadenopathy:     Cervical: Cervical adenopathy present.  Skin:    General: Skin is warm and dry.  Neurological:     Mental Status: She is alert and oriented to person, place, and time.           Assessment & Plan:

## 2018-09-20 DIAGNOSIS — J019 Acute sinusitis, unspecified: Secondary | ICD-10-CM | POA: Diagnosis not present

## 2018-09-20 DIAGNOSIS — J40 Bronchitis, not specified as acute or chronic: Secondary | ICD-10-CM | POA: Diagnosis not present

## 2018-10-14 DIAGNOSIS — G4733 Obstructive sleep apnea (adult) (pediatric): Secondary | ICD-10-CM | POA: Diagnosis not present

## 2018-10-14 DIAGNOSIS — F331 Major depressive disorder, recurrent, moderate: Secondary | ICD-10-CM | POA: Diagnosis not present

## 2018-10-14 DIAGNOSIS — Z8041 Family history of malignant neoplasm of ovary: Secondary | ICD-10-CM | POA: Diagnosis not present

## 2018-10-14 DIAGNOSIS — K219 Gastro-esophageal reflux disease without esophagitis: Secondary | ICD-10-CM | POA: Diagnosis not present

## 2018-10-26 DIAGNOSIS — G4733 Obstructive sleep apnea (adult) (pediatric): Secondary | ICD-10-CM | POA: Diagnosis not present

## 2018-11-08 ENCOUNTER — Telehealth: Payer: Self-pay | Admitting: Nurse Practitioner

## 2018-11-08 ENCOUNTER — Other Ambulatory Visit: Payer: Self-pay

## 2018-11-08 DIAGNOSIS — R030 Elevated blood-pressure reading, without diagnosis of hypertension: Secondary | ICD-10-CM

## 2018-11-08 DIAGNOSIS — E282 Polycystic ovarian syndrome: Secondary | ICD-10-CM

## 2018-11-08 DIAGNOSIS — R600 Localized edema: Secondary | ICD-10-CM

## 2018-11-08 DIAGNOSIS — Z6841 Body Mass Index (BMI) 40.0 and over, adult: Secondary | ICD-10-CM

## 2018-11-08 NOTE — Patient Instructions (Addendum)
If you develop any of the urgent symptoms we discussed please report to urgent care or ED right away. Start working on lowering your sodium and elevate your legs Activity as tolerated Discussed concern of needing a medication to control your blood pressure; which will also help with swelling that I will evaluate in the morning if eval warrants it Monitor blood pressure tonight and in the am and bring readings with you tomorrow when you come into the office; verbalized understanding of plan and will draw labs

## 2018-11-08 NOTE — Progress Notes (Addendum)
    Subjective:    Patient ID: Joanna Scott, female    DOB: 1975/07/29, 43 y.o.   MRN: [id number]  HPI Cambree calls with concern of swelling in her hands and feet, with SOB with activity after 15 minutes of walking . She has a long standing hx of PCOS since 20s and noted several visit encounters of elevated blood pressure with no med for this and she stopped her metformin prescribed by another provider some months back. She admits to walking with family 3-4 times a week for about 30-60 minutes since "stay at home" implemented but she has not changed her eating habits. She reports she does a high intake of sodium. She denies c/p, wheezing, blurred vision, headache or dizziness.    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 She admits her swelling is worse in the pm and improves when she wakes up. Pt. Reports she has been in touch with her PCP today and has had virtual visits which she was advised today to be seen. 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  She reports her b/p reading was 138/90 which prompted her to contact her PCP and 141/100 this afternoon.  Review of Systems  Constitutional: Negative for fever.  Eyes: Negative for visual disturbance.  Respiratory: Positive for shortness of breath. Negative for cough and wheezing.   Cardiovascular: Negative for chest pain.       Hands and feet swelling  Neurological: Negative for dizziness and headaches.       Objective:   Physical Exam  A&O Answers all questions appropriately      Assessment & Plan:

## 2018-11-09 ENCOUNTER — Encounter: Payer: Self-pay | Admitting: Nurse Practitioner

## 2018-11-09 ENCOUNTER — Other Ambulatory Visit: Payer: Self-pay

## 2018-11-09 ENCOUNTER — Ambulatory Visit: Payer: Self-pay | Admitting: Nurse Practitioner

## 2018-11-09 VITALS — BP 126/78 | HR 79 | Temp 98.2°F | Resp 16 | Ht 64.0 in | Wt 262.0 lb

## 2018-11-09 DIAGNOSIS — R600 Localized edema: Secondary | ICD-10-CM

## 2018-11-09 DIAGNOSIS — R030 Elevated blood-pressure reading, without diagnosis of hypertension: Secondary | ICD-10-CM

## 2018-11-09 DIAGNOSIS — Z6841 Body Mass Index (BMI) 40.0 and over, adult: Secondary | ICD-10-CM

## 2018-11-09 DIAGNOSIS — E282 Polycystic ovarian syndrome: Secondary | ICD-10-CM

## 2018-11-09 NOTE — Progress Notes (Signed)
   Subjective:    Patient ID: Joanna Scott, female    DOB: [birth year], 43 y.o.   MRN: [id number]  HPIHeidi presents to the clinic today for f/u on yesterdays virtual OV. She called with c/o swelling to her hand and feet and SOB when walking with her family over the last few days. She admits this morning the swelling is much improved as she elevated feet last night as instructed. She was in touch with her PCP yesterday and advised to f/u with an office where she could be seen in person. Natacha has a long standing hx of PCOS and was put on metformin some months back; which she stopped taking and just restarted yesterday. Her b/p is elevated most visits in office and is not adhering to a low sodium diet. She has been monitoring b/p at home and reports blood pressure has stayed elevated. She brings readings in this morning and with highest 140/90 4/27 and lowest 113/88 4/28. This morning she got a b/p reading of 128/91. She denies h/a, blurred vision, c/p, SOB, dizziness. She admits new onset of sleep apnea and has started cpap and tolerating greater than 4 hours a night and has been over for about 10 days.   Review of Systems  Constitutional: Negative for fatigue and fever.  Eyes: Negative for visual disturbance.  Respiratory: Negative for cough and shortness of breath.   Cardiovascular: Negative for chest pain.  Gastrointestinal: Negative for diarrhea, nausea and vomiting.  Skin: Negative for rash.  Neurological: Negative for dizziness and headaches.       Objective:   Physical Exam Vitals signs reviewed.  Constitutional:      General: She is not in acute distress.    Appearance: She is well-developed. She is obese. She is not ill-appearing.  HENT:     Head: Normocephalic and atraumatic.  Neck:     Musculoskeletal: Normal range of motion and neck supple.     Vascular: No carotid bruit.  Cardiovascular:     Rate and Rhythm: Normal rate and regular rhythm.     Heart sounds: Normal heart  sounds. No murmur.  Pulmonary:     Effort: Pulmonary effort is normal. No respiratory distress.     Breath sounds: Normal breath sounds. No wheezing.  Abdominal:     General: Bowel sounds are normal.     Palpations: Abdomen is soft.     Tenderness: There is no abdominal tenderness.  Musculoskeletal: Normal range of motion.  Skin:    General: Skin is warm and dry.  Neurological:     Mental Status: She is alert and oriented to person, place, and time.  Psychiatric:        Mood and Affect: Mood normal.           Assessment & Plan:

## 2018-11-09 NOTE — Patient Instructions (Addendum)
Labs ordered yesterday drawn today  Start low sodium diet and need for aggressive lifestyle change; to cut back on adding salt to foods and try Mrs. Dash or other herbs. Continue exercise as tolerated and work on healthy eating habits to prevent diabetes and control blood pressure Monitor b/p over the next 2 weeks: 4 times a day (in the am and pm; recheck after 1 minute each time). Do this 3 times a week. Schedule telephonic OV 2 weeks to re-eval  DASH Eating Plan DASH stands for "Dietary Approaches to Stop Hypertension." The DASH eating plan is a healthy eating plan that has been shown to reduce high blood pressure (hypertension). It may also reduce your risk for type 2 diabetes, heart disease, and stroke. The DASH eating plan may also help with weight loss. What are tips for following this plan?   General guidelines  Avoid eating more than 2,300 mg (milligrams) of salt (sodium) a day. If you have hypertension, you may need to reduce your sodium intake to 1,500 mg a day.  Limit alcohol intake to no more than 1 drink a day for nonpregnant women and 2 drinks a day for men. One drink equals 12 oz of beer, 5 oz of wine, or 1 oz of hard liquor.  Work with your health care provider to maintain a healthy body weight or to lose weight. Ask what an ideal weight is for you.  Get at least 30 minutes of exercise that causes your heart to beat faster (aerobic exercise) most days of the week. Activities may include walking, swimming, or biking.  Work with your health care provider or diet and nutrition specialist (dietitian) to adjust your eating plan to your individual calorie needs. Reading food labels    Check food labels for the amount of sodium per serving. Choose foods with less than 5 percent of the Daily Value of sodium. Generally, foods with less than 300 mg of sodium per serving fit into this eating plan.  To find whole grains, look for the word "whole" as the first word in the ingredient  list. Shopping  Buy products labeled as "low-sodium" or "no salt added."  Buy fresh foods. Avoid canned foods and premade or frozen meals. Cooking  Avoid adding salt when cooking. Use salt-free seasonings or herbs instead of table salt or sea salt. Check with your health care provider or pharmacist before using salt substitutes.  Do not fry foods. Cook foods using healthy methods such as baking, boiling, grilling, and broiling instead.  Cook with heart-healthy oils, such as olive, canola, soybean, or sunflower oil. Meal planning  Eat a balanced diet that includes: ? 5 or more servings of fruits and vegetables each day. At each meal, try to fill half of your plate with fruits and vegetables. ? Up to 6-8 servings of whole grains each day. ? Less than 6 oz of lean meat, poultry, or fish each day. A 3-oz serving of meat is about the same size as a deck of cards. One egg equals 1 oz. ? 2 servings of low-fat dairy each day. ? A serving of nuts, seeds, or beans 5 times each week. ? Heart-healthy fats. Healthy fats called Omega-3 fatty acids are found in foods such as flaxseeds and coldwater fish, like sardines, salmon, and mackerel.  Limit how much you eat of the following: ? Canned or prepackaged foods. ? Food that is high in trans fat, such as fried foods. ? Food that is high in saturated fat, such  as fatty meat. ? Sweets, desserts, sugary drinks, and other foods with added sugar. ? Full-fat dairy products.  Do not salt foods before eating.  Try to eat at least 2 vegetarian meals each week.  Eat more home-cooked food and less restaurant, buffet, and fast food.  When eating at a restaurant, ask that your food be prepared with less salt or no salt, if possible. What foods are recommended? The items listed may not be a complete list. Talk with your dietitian about what dietary choices are best for you. Grains Whole-grain or whole-wheat bread. Whole-grain or whole-wheat pasta. Brown  rice. [place]. Bulgur. Whole-grain and low-sodium cereals. Pita bread. Low-fat, low-sodium crackers. Whole-wheat flour tortillas. Vegetables Fresh or frozen vegetables (raw, steamed, roasted, or grilled). Low-sodium or reduced-sodium tomato and vegetable juice. Low-sodium or reduced-sodium tomato sauce and tomato paste. Low-sodium or reduced-sodium canned vegetables. Fruits All fresh, dried, or frozen fruit. Canned fruit in natural juice (without added sugar). Meat and other protein foods Skinless chicken or [place]. Ground chicken or [place]. Pork with fat trimmed off. Fish and seafood. Egg whites. Dried beans, peas, or lentils. Unsalted nuts, nut butters, and seeds. Unsalted canned beans. Lean cuts of beef with fat trimmed off. Low-sodium, lean deli meat. Dairy Low-fat (1%) or fat-free (skim) milk. Fat-free, low-fat, or reduced-fat cheeses. Nonfat, low-sodium ricotta or cottage cheese. Low-fat or nonfat yogurt. Low-fat, low-sodium cheese. Fats and oils Soft margarine without trans fats. Vegetable oil. Low-fat, reduced-fat, or light mayonnaise and salad dressings (reduced-sodium). Canola, safflower, olive, soybean, and sunflower oils. Avocado. Seasoning and other foods Herbs. Spices. Seasoning mixes without salt. Unsalted popcorn and pretzels. Fat-free sweets. What foods are not recommended? The items listed may not be a complete list. Talk with your dietitian about what dietary choices are best for you. Grains Baked goods made with fat, such as croissants, muffins, or some breads. Dry pasta or rice meal packs. Vegetables Creamed or fried vegetables. Vegetables in a cheese sauce. Regular canned vegetables (not low-sodium or reduced-sodium). Regular canned tomato sauce and paste (not low-sodium or reduced-sodium). Regular tomato and vegetable juice (not low-sodium or reduced-sodium). Angie Fava. Olives. Fruits Canned fruit in a light or heavy syrup. Fried fruit. Fruit in cream or butter  sauce. Meat and other protein foods Fatty cuts of meat. Ribs. Fried meat. Berniece Salines. Sausage. Bologna and other processed lunch meats. Salami. Fatback. Hotdogs. Bratwurst. Salted nuts and seeds. Canned beans with added salt. Canned or smoked fish. Whole eggs or egg yolks. Chicken or [place] with skin. Dairy Whole or 2% milk, cream, and half-and-half. Whole or full-fat cream cheese. Whole-fat or sweetened yogurt. Full-fat cheese. Nondairy creamers. Whipped toppings. Processed cheese and cheese spreads. Fats and oils Butter. Stick margarine. Lard. Shortening. Ghee. Bacon fat. Tropical oils, such as coconut, palm kernel, or palm oil. Seasoning and other foods Salted popcorn and pretzels. Onion salt, garlic salt, seasoned salt, table salt, and sea salt. Worcestershire sauce. Tartar sauce. Barbecue sauce. Teriyaki sauce. Soy sauce, including reduced-sodium. Steak sauce. Canned and packaged gravies. Fish sauce. Oyster sauce. Cocktail sauce. Horseradish that you find on the shelf. Ketchup. Mustard. Meat flavorings and tenderizers. Bouillon cubes. Hot sauce and Tabasco sauce. Premade or packaged marinades. Premade or packaged taco seasonings. Relishes. Regular salad dressings. Where to find more information:  National Heart, Lung, and [place]: https://wilson-eaton.com/  American Heart Association: www.heart.org Summary  The DASH eating plan is a healthy eating plan that has been shown to reduce high blood pressure (hypertension). It may also reduce your risk  for type 2 diabetes, heart disease, and stroke.  With the DASH eating plan, you should limit salt (sodium) intake to 2,300 mg a day. If you have hypertension, you may need to reduce your sodium intake to 1,500 mg a day.  When on the DASH eating plan, aim to eat more fresh fruits and vegetables, whole grains, lean proteins, low-fat dairy, and heart-healthy fats.  Work with your health care provider or diet and nutrition specialist (dietitian) to adjust  your eating plan to your individual calorie needs. This information is not intended to replace advice given to you by your health care provider. Make sure you discuss any questions you have with your health care provider. Document Released: 06/18/2011 Document Revised: 06/22/2016 Document Reviewed: 06/22/2016 Elsevier Interactive Patient Education  2019 [employer].

## 2018-11-09 NOTE — Addendum Note (Signed)
Addended by: Jethro Bolus on: 11/09/2018 09:43 AM   Modules accepted: Orders

## 2018-11-10 ENCOUNTER — Other Ambulatory Visit: Payer: Self-pay

## 2018-11-10 ENCOUNTER — Telehealth: Payer: Self-pay | Admitting: Nurse Practitioner

## 2018-11-10 DIAGNOSIS — Z6841 Body Mass Index (BMI) 40.0 and over, adult: Secondary | ICD-10-CM

## 2018-11-10 DIAGNOSIS — R7301 Impaired fasting glucose: Secondary | ICD-10-CM

## 2018-11-10 DIAGNOSIS — E782 Mixed hyperlipidemia: Secondary | ICD-10-CM

## 2018-11-10 DIAGNOSIS — D509 Iron deficiency anemia, unspecified: Secondary | ICD-10-CM

## 2018-11-10 LAB — CMP14+EGFR
ALT: 26 IU/L (ref 0–32)
AST: 34 IU/L (ref 0–40)
Albumin/Globulin Ratio: 1.5 (ref 1.2–2.2)
Albumin: 4.3 g/dL (ref 3.8–4.8)
Alkaline Phosphatase: 80 IU/L (ref 39–117)
BUN/Creatinine Ratio: 10 (ref 9–23)
BUN: 11 mg/dL (ref 6–24)
Bilirubin Total: 0.3 mg/dL (ref 0.0–1.2)
CO2: 19 mmol/L — ABNORMAL LOW (ref 20–29)
Calcium: 8.9 mg/dL (ref 8.7–10.2)
Chloride: 104 mmol/L (ref 96–106)
Creatinine, Ser: 1.06 mg/dL — ABNORMAL HIGH (ref 0.57–1.00)
GFR calc Af Amer: 75 mL/min/{1.73_m2} (ref >59)
GFR calc non Af Amer: 65 mL/min/{1.73_m2} (ref >59)
Globulin, Total: 2.8 g/dL (ref 1.5–4.5)
Glucose: 111 mg/dL — ABNORMAL HIGH (ref 65–99)
Potassium: 5.2 mmol/L (ref 3.5–5.2)
Sodium: 143 mmol/L (ref 134–144)
Total Protein: 7.1 g/dL (ref 6.0–8.5)

## 2018-11-10 LAB — LIPID PANEL
Chol/HDL Ratio: 6.5 ratio — ABNORMAL HIGH (ref 0.0–4.4)
Cholesterol, Total: 215 mg/dL — ABNORMAL HIGH (ref 100–199)
HDL: 33 mg/dL — ABNORMAL LOW (ref >39)
LDL Calculated: 142 mg/dL — ABNORMAL HIGH (ref 0–99)
Triglycerides: 199 mg/dL — ABNORMAL HIGH (ref 0–149)
VLDL Cholesterol Cal: 40 mg/dL (ref 5–40)

## 2018-11-10 LAB — CBC WITH DIFFERENTIAL
Basophils Absolute: 0.1 10*3/uL (ref 0.0–0.2)
Basos: 1 %
EOS (ABSOLUTE): 0.2 10*3/uL (ref 0.0–0.4)
Eos: 4 %
Hematocrit: 34.7 % (ref 34.0–46.6)
Hemoglobin: 10.5 g/dL — ABNORMAL LOW (ref 11.1–15.9)
Immature Grans (Abs): 0 10*3/uL (ref 0.0–0.1)
Immature Granulocytes: 0 %
Lymphocytes Absolute: 1.6 10*3/uL (ref 0.7–3.1)
Lymphs: 28 %
MCH: 23.5 pg — ABNORMAL LOW (ref 26.6–33.0)
MCHC: 30.3 g/dL — ABNORMAL LOW (ref 31.5–35.7)
MCV: 78 fL — ABNORMAL LOW (ref 79–97)
Monocytes Absolute: 0.4 10*3/uL (ref 0.1–0.9)
Monocytes: 6 %
Neutrophils Absolute: 3.6 10*3/uL (ref 1.4–7.0)
Neutrophils: 61 %
RBC: 4.46 x10E6/uL (ref 3.77–5.28)
RDW: 15.2 % (ref 11.7–15.4)
WBC: 5.9 10*3/uL (ref 3.4–10.8)

## 2018-11-10 LAB — HEMOGLOBIN A1C
Est. average glucose Bld gHb Est-mCnc: 131 mg/dL
Hgb A1c MFr Bld: 6.2 % — ABNORMAL HIGH (ref 4.8–5.6)

## 2018-11-10 NOTE — Patient Instructions (Addendum)
Joanna Scott please refer to the instructions for healthy eating habits and education material that was in your plan yesterday. I am attaching some information on iron; continue your daily vitamin (can take postnatal if you'd like) Continue exercise and work on low cholesterol (low fat, baked lean, non-fried foods and modify your carbs) Increase your water intake at least 8 cups or water a day Follow up with your PCP and repeat labs in 3 months  Iron Deficiency Anemia, Adult Iron-deficiency anemia is when you have a low amount of red blood cells or hemoglobin. This happens because you have too little iron in your body. Hemoglobin carries oxygen to parts of the body. Anemia can cause your body to not get enough oxygen. It may or may not cause symptoms. Follow these instructions at home: Medicines  Take over-the-counter and prescription medicines only as told by your doctor. This includes iron pills (supplements) and vitamins.  If you cannot handle taking iron pills by mouth, ask your doctor about getting iron through: ? A vein (intravenously). ? A shot (injection) into a muscle.  Take iron pills when your stomach is empty. If you cannot handle this, take them with food.  Do not drink milk or take antacids at the same time as your iron pills.  To prevent trouble pooping (constipation), eat fiber or take medicine (stool softener) as told by your doctor. Eating and drinking    Talk with your doctor before changing the foods you eat. He or she may tell you to eat foods that have a lot of iron, such as: ? Liver. ? Lowfat (lean) beef. ? Breads and cereals that have iron added to them (fortified breads and cereals). ? Eggs. ? Dried fruit. ? Dark green, leafy vegetables.  Drink enough fluid to keep your pee (urine) clear or pale yellow.  Eat fresh fruits and vegetables that are high in vitamin C. They help your body to use iron. Foods with a lot of vitamin C  include: ? Oranges. ? Peppers. ? Tomatoes. ? Mangoes. General instructions  Return to your normal activities as told by your doctor. Ask your doctor what activities are safe for you.  Keep yourself clean, and keep things clean around you (your surroundings). Anemia can make you get sick more easily.  Keep all follow-up visits as told by your doctor. This is important. Contact a doctor if:  You feel sick to your stomach (nauseous).  You throw up (vomit).  You feel weak.  You are sweating for no clear reason.  You have trouble pooping, such as: ? Pooping (having a bowel movement) less than 3 times a week. ? Straining to poop. ? Having poop that is hard, dry, or larger than normal. ? Feeling full or bloated. ? Pain in the lower belly. ? Not feeling better after pooping. Get help right away if:  You pass out (faint). If this happens, do not drive yourself to the hospital. Call your local emergency services (911 in the [place]  You have chest pain.  You have shortness of breath that: ? Is very bad. ? Gets worse with physical activity.  You have a fast heartbeat.  You get light-headed when getting up from sitting or lying down. This information is not intended to replace advice given to you by your health care provider. Make sure you discuss any questions you have with your health care provider. Document Released: 08/01/2010 Document Revised: 03/18/2016 Document Reviewed: 03/18/2016 Elsevier Interactive Patient Education  2019 [employer].

## 2018-11-10 NOTE — Progress Notes (Signed)
   Subjective:    Patient ID: Joanna Scott, female    DOB: [birth year], 43 y.o.   MRN: [id number]  HPI Joanna Scott is on a telephonic visit to discussed and explain labs today. Reviewed all labs in detail and all questions answered. To refer to plan for instructions regarding labs. On metformin for PCOS and a1c yesterday of 6.2. Takes daily MVI for anemia. Hx of cholesterol and reports last a1c 5.9.  Review of Systems     Objective:   Physical Exam Responds appropriately       Assessment & Plan:

## 2018-11-23 ENCOUNTER — Telehealth: Payer: Self-pay | Admitting: Nurse Practitioner

## 2018-11-23 ENCOUNTER — Other Ambulatory Visit: Payer: Self-pay

## 2018-11-23 DIAGNOSIS — E782 Mixed hyperlipidemia: Secondary | ICD-10-CM

## 2018-11-23 DIAGNOSIS — Z6841 Body Mass Index (BMI) 40.0 and over, adult: Secondary | ICD-10-CM

## 2018-11-23 DIAGNOSIS — R030 Elevated blood-pressure reading, without diagnosis of hypertension: Secondary | ICD-10-CM

## 2018-11-23 DIAGNOSIS — R7301 Impaired fasting glucose: Secondary | ICD-10-CM

## 2018-11-23 NOTE — Progress Notes (Signed)
   Subjective:    Patient ID: Joanna Scott, female    DOB: Dec 25, 1975, 43 y.o.   MRN: [id number]  HPI Abrina consents to a telephonic visit today with f/u on blood pressure and recommended lifestyle changes. She has been monitoring her blood pressure over the last 2 weeks and reports no readings over 124/87; which b/p this morning was 125/83 and 2 days ago pm reading 125/83. She reports her swelling has resolved. She has stopped putting salt on her foods, changed to Agave sugar and cut back on carbs. She also admits she's being more active and tolerating with no c/o SOB or intolerance. She's using her cpap as directed and admits missing 1 night and "felt horrible" as she was sleeping with the kids and forgot.  Taking metformin as directed and admits initially was vomiting but adjusted to taking at HS with a snack and doing much better.  Plans to have a virtual visit with PCP in the upcoming weeks to update.  Review of Systems  Constitutional: Negative for activity change, fatigue and fever.  Respiratory: Negative for shortness of breath.   Cardiovascular: Negative for leg swelling.       Objective:   Physical Exam  Answers questions appropriately and promptly      Assessment & Plan:

## 2018-11-23 NOTE — Patient Instructions (Signed)
Great job on lifestyle changes Joanna Scott! Continue healthy eating habits and exercise as tolerated Get a virtual visit with PCP in the upcoming weeks and don't forget to repeat labs in about 10 weeks Continue metformin and other meds as directed Continue cpap as instructed by prescribing provider If you need [place], please don't hesitate to call the office

## 2018-11-25 DIAGNOSIS — G4733 Obstructive sleep apnea (adult) (pediatric): Secondary | ICD-10-CM | POA: Diagnosis not present

## 2018-12-26 DIAGNOSIS — G4733 Obstructive sleep apnea (adult) (pediatric): Secondary | ICD-10-CM | POA: Diagnosis not present

## 2019-01-25 DIAGNOSIS — G4733 Obstructive sleep apnea (adult) (pediatric): Secondary | ICD-10-CM | POA: Diagnosis not present

## 2019-02-13 DIAGNOSIS — R7309 Other abnormal glucose: Secondary | ICD-10-CM | POA: Diagnosis not present

## 2019-02-13 DIAGNOSIS — E669 Obesity, unspecified: Secondary | ICD-10-CM | POA: Diagnosis not present

## 2019-02-13 DIAGNOSIS — E785 Hyperlipidemia, unspecified: Secondary | ICD-10-CM | POA: Diagnosis not present

## 2019-02-13 DIAGNOSIS — Z79899 Other long term (current) drug therapy: Secondary | ICD-10-CM | POA: Diagnosis not present

## 2019-02-17 DIAGNOSIS — R7303 Prediabetes: Secondary | ICD-10-CM | POA: Diagnosis not present

## 2019-02-17 DIAGNOSIS — E039 Hypothyroidism, unspecified: Secondary | ICD-10-CM | POA: Diagnosis not present

## 2019-02-17 DIAGNOSIS — E785 Hyperlipidemia, unspecified: Secondary | ICD-10-CM | POA: Diagnosis not present

## 2019-02-17 DIAGNOSIS — Z8041 Family history of malignant neoplasm of ovary: Secondary | ICD-10-CM | POA: Diagnosis not present

## 2019-03-03 DIAGNOSIS — Z8041 Family history of malignant neoplasm of ovary: Secondary | ICD-10-CM | POA: Diagnosis not present

## 2019-03-03 DIAGNOSIS — Z809 Family history of malignant neoplasm, unspecified: Secondary | ICD-10-CM | POA: Diagnosis not present

## 2019-03-03 DIAGNOSIS — G4733 Obstructive sleep apnea (adult) (pediatric): Secondary | ICD-10-CM | POA: Diagnosis not present

## 2019-03-08 DIAGNOSIS — I81 Portal vein thrombosis: Secondary | ICD-10-CM | POA: Diagnosis not present

## 2019-03-08 DIAGNOSIS — N939 Abnormal uterine and vaginal bleeding, unspecified: Secondary | ICD-10-CM | POA: Diagnosis not present

## 2019-03-08 DIAGNOSIS — D6852 Prothrombin gene mutation: Secondary | ICD-10-CM | POA: Diagnosis not present

## 2019-03-15 DIAGNOSIS — N939 Abnormal uterine and vaginal bleeding, unspecified: Secondary | ICD-10-CM | POA: Diagnosis not present

## 2019-03-15 DIAGNOSIS — Z86010 Personal history of colonic polyps: Secondary | ICD-10-CM | POA: Diagnosis not present

## 2019-03-15 DIAGNOSIS — Z1509 Genetic susceptibility to other malignant neoplasm: Secondary | ICD-10-CM | POA: Diagnosis not present

## 2019-03-31 DIAGNOSIS — D225 Melanocytic nevi of trunk: Secondary | ICD-10-CM | POA: Diagnosis not present

## 2019-03-31 DIAGNOSIS — D2261 Melanocytic nevi of right upper limb, including shoulder: Secondary | ICD-10-CM | POA: Diagnosis not present

## 2019-03-31 DIAGNOSIS — D485 Neoplasm of uncertain behavior of skin: Secondary | ICD-10-CM | POA: Diagnosis not present

## 2019-03-31 DIAGNOSIS — D2272 Melanocytic nevi of left lower limb, including hip: Secondary | ICD-10-CM | POA: Diagnosis not present

## 2019-03-31 DIAGNOSIS — D2262 Melanocytic nevi of left upper limb, including shoulder: Secondary | ICD-10-CM | POA: Diagnosis not present

## 2019-04-10 ENCOUNTER — Encounter: Payer: Self-pay | Admitting: Family Medicine

## 2019-04-10 ENCOUNTER — Other Ambulatory Visit: Payer: Self-pay

## 2019-04-10 ENCOUNTER — Ambulatory Visit: Payer: BLUE CROSS/BLUE SHIELD | Admitting: Family Medicine

## 2019-04-10 VITALS — BP 126/74 | HR 96 | Temp 97.7°F | Resp 14 | Ht 64.0 in | Wt 265.5 lb

## 2019-04-10 DIAGNOSIS — D649 Anemia, unspecified: Secondary | ICD-10-CM | POA: Insufficient documentation

## 2019-04-10 DIAGNOSIS — Z9989 Dependence on other enabling machines and devices: Secondary | ICD-10-CM

## 2019-04-10 DIAGNOSIS — R7303 Prediabetes: Secondary | ICD-10-CM | POA: Insufficient documentation

## 2019-04-10 DIAGNOSIS — G4733 Obstructive sleep apnea (adult) (pediatric): Secondary | ICD-10-CM

## 2019-04-10 DIAGNOSIS — E039 Hypothyroidism, unspecified: Secondary | ICD-10-CM | POA: Diagnosis not present

## 2019-04-10 DIAGNOSIS — K219 Gastro-esophageal reflux disease without esophagitis: Secondary | ICD-10-CM | POA: Insufficient documentation

## 2019-04-10 DIAGNOSIS — I10 Essential (primary) hypertension: Secondary | ICD-10-CM | POA: Insufficient documentation

## 2019-04-10 DIAGNOSIS — E785 Hyperlipidemia, unspecified: Secondary | ICD-10-CM | POA: Diagnosis not present

## 2019-04-10 DIAGNOSIS — Z7689 Persons encountering health services in other specified circumstances: Secondary | ICD-10-CM

## 2019-04-10 DIAGNOSIS — Z1509 Genetic susceptibility to other malignant neoplasm: Secondary | ICD-10-CM

## 2019-04-10 DIAGNOSIS — J309 Allergic rhinitis, unspecified: Secondary | ICD-10-CM | POA: Insufficient documentation

## 2019-04-10 DIAGNOSIS — Z6841 Body Mass Index (BMI) 40.0 and over, adult: Secondary | ICD-10-CM

## 2019-04-10 DIAGNOSIS — D6852 Prothrombin gene mutation: Secondary | ICD-10-CM

## 2019-04-10 DIAGNOSIS — F329 Major depressive disorder, single episode, unspecified: Secondary | ICD-10-CM | POA: Insufficient documentation

## 2019-04-10 HISTORY — DX: Anemia, unspecified: D64.9

## 2019-04-10 MED ORDER — LEVOTHYROXINE SODIUM 50 MCG PO TABS: 50.0000 ug | ORAL_TABLET | Freq: Every day | ORAL | 0 refills | Status: DC

## 2019-04-10 MED ORDER — PANTOPRAZOLE SODIUM 40 MG PO TBEC: 40.0000 mg | DELAYED_RELEASE_TABLET | ORAL | 1 refills | Status: DC

## 2019-04-10 MED ORDER — METFORMIN HCL ER 500 MG PO TB24: 1500.0000 mg | ORAL_TABLET | Freq: Two times a day (BID) | ORAL | 1 refills | Status: DC

## 2019-04-10 MED ORDER — TRINTELLIX 10 MG PO TABS: 20.0000 mg | ORAL_TABLET | Freq: Every day | ORAL | 1 refills | Status: DC

## 2019-04-10 NOTE — Patient Instructions (Addendum)
Return in 3 weeks for your Thyroid labs - I will review your records and put in other labs that look due.    Come back for complete physical a few months after your surgery - Feb to March

## 2019-04-10 NOTE — Progress Notes (Signed)
Name: Joanna DelayHeidi Canniff   MRN: [id number]    DOB: Aug 29, 1975   Date:04/10/2019       Progress Note  Chief Complaint  Patient presents with  . Establish Care     Subjective:   Joanna Scott is a 43 y.o. female, presents to clinic for routine follow up on the conditions listed above.  [facility] past PCP but provider is changing from Primary care to psychiatry.  Records were previously faxed to our office and they were received but not reviewed entirely (over 200 pages).  Currently nursing - wants to make sure this is known and considered in all med decisions.  Two kids 159 y/o boy and 634 y/o boy, she is a Games developerAHM, is married and [place] [address] [place].  She is studying for realtor exam.  She is here to establish care here today.  She [facility] which she states has been very good care but because of her medical complexity she went to establish with a separate PCP and she will likely continue to use the [facility] for mild illness if she cannot get into her PCPs office  Hypothyroid - started in the last month maybe three weeks.  She is unsure of the dose but the chart does say 50 mcg she is taking correctly in the morning on empty stomach and is trying to wait at least 30 minutes before taking other medications or eating.  She has not felt any different at all since starting the medications.  She denies any dose taper.    PCOS - weight increasing over the years and insulin resistance she has been on metformin for the past couple years.  She is currently tolerating 1500 mg/day she takes 500 mg in the morning and thousand at night she cannot tolerate more than this, last A1c per record review was 6.0 about a month ago.   Lynch syndrome - new dx, she was tested because her sister is + for LS and has Ovarian-like cancer [facility] who is help manage her PCOS,  menorrhagia and help to work-up Lynch syndrome and they are planning to do a prophylactic total abdominal hysterectomy and bilateral salpingo-oophorectomy in about 2 months from now.    Prothrombin gene disorder - Clotting disorder -patient has had DVTs before but she does not require daily anticoagulation.  She states with any long travel, surgeries or pregnancies she would require anticoagulants.  She is chronically anemic she states that she is taking a multivitamin daily but over several years taking iron has never improved her H/H. she is going to see hematology prior to her surgery for additional clearance  Depression: Patient states she has long history of depression and has tried multiple SSRIs.  She is currently on trintellex - Will transfer care here with meds (will not stay with PCP who is transitioning to psych).  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  She has trouble stopping herself from staying up  very late and getting her work done because of the only time she has for herself otherwise her day is spent taking care of her 75-year-old, and helping her 11-year-old with virtual school which requires constant monitoring and assistance.  She states she is currently getting about 6 to 7 hours of good sleep and she is wearing her CPAP for obstructive sleep apnea.  She did a sleep study in the past 2 years or so, she does feel that she gets much better quality of sleep with the CPAP she just would like to sleep a little bit longer than what she has been, and she worries about the long-term health effects of poor sleep  for a decade.  Pt also reports "accelerated Colonoscopy screening schedule" - Seeing [facility] - was doing screening colonoscopy every three years  -she reports precancerous polyps and says that she may be transitioning to yearly screening   Patient Active Problem List   Diagnosis Date Noted  . Hyperlipidemia 04/10/2019  . Essential hypertension 04/10/2019  . Prediabetes 04/10/2019  . Major depressive disorder with current active episode 04/10/2019  . Anemia 04/10/2019  . Gastroesophageal reflux disease 04/10/2019  . Allergic rhinitis 04/10/2019  . Bilateral polycystic ovarian syndrome 10/20/2012    Past Surgical History:  Procedure Laterality Date  . CESAREAN SECTION    . CESAREAN SECTION N/A 12/17/2014   Procedure: CESAREAN SECTION;  Surgeon: Conard Novak, MD;  Location: [facility] ORS;  Service: Obstetrics;  Laterality: N/A;  . DIAGNOSTIC LAPAROSCOPY    . DILATION AND CURETTAGE OF UTERUS    . LAPAROSCOPIC CHOLECYSTECTOMY    . Lynch syndrome      Family History  Problem Relation Age of Onset  . Malignant hyperthermia Cousin   . Deep vein thrombosis Maternal Grandmother   . Hypertension Mother   . Depression Mother   . COPD Mother   . Ovarian cancer Sister   . Stroke Maternal Grandfather   . Heart attack Maternal Grandfather   . Malignant hyperthermia Other   . Encopresis Son     Social History   Socioeconomic History  . Marital status: Married    Spouse name: Chrissie Noa  . Number of children: 2  . Years of education: 63  . Highest education level: Bachelor's degree (e.g., BA, AB, BS)  Occupational History  . Occupation: [employer]  Social Needs  . Financial resource strain: Not hard at all  . Food insecurity    Worry: Never true    Inability: Never true  . Transportation needs    Medical: No    Non-medical: No  Tobacco Use  . Smoking status: Never Smoker  . Smokeless tobacco: Never Used  Substance and Sexual Activity  . Alcohol use: Yes  . Drug use: No  .  Sexual activity: Yes    Birth control/protection: None  Lifestyle  . Physical activity    Days per week: 4 days    Minutes per session: 20 min  . Stress: Not at all  Relationships  . Social Musician on phone: Three times a week    Gets together: Not on file    Attends religious service: Never    Active member of club or organization: No    Attends meetings of clubs or organizations: Never    Relationship status: Married  . Intimate partner violence    Fear of current or ex partner: No    Emotionally abused: No    Physically abused: No  Forced sexual activity: No  Other Topics Concern  . Not on file  Social History Narrative  . Not on file     Current Outpatient Medications:  .  cetirizine (ZYRTEC) 10 MG tablet, Take by mouth., Disp: , Rfl:  .  EPINEPHrine 0.3 mg/0.3 mL IJ SOAJ injection, INJECT 1 SYRINGE AS INSTRUCTED IN CASE OF SEVERE ALLERGIC REACTION, Disp: , Rfl: 0 .  ibuprofen (ADVIL,MOTRIN) 600 MG tablet, Take 1 tablet (600 mg total) by mouth every 6 (six) hours as needed for mild pain., Disp: 40 tablet, Rfl: 1 .  ketoconazole (NIZORAL) 2 % shampoo, USE 1 APPLICATION TOPICALLY ONCE DAILY AS NEEDED, Disp: , Rfl: 3 .  levothyroxine (SYNTHROID) 50 MCG tablet, Take 50 mcg by mouth daily., Disp: , Rfl:  .  metFORMIN (GLUCOPHAGE-XR) 500 MG 24 hr tablet, Take 1,500 mg by mouth 2 (two) times daily with a meal., Disp: , Rfl:  .  pantoprazole (PROTONIX) 40 MG tablet, Take 40 mg by mouth every morning., Disp: , Rfl:  .  triamcinolone (NASACORT) 55 MCG/ACT AERO nasal inhaler, Place 2 sprays into the nose daily., Disp: , Rfl: 12 .  TRINTELLIX 10 MG TABS tablet, Take 20 mg by mouth daily. , Disp: , Rfl: 0  Allergies  Allergen Reactions  . Other     Peaches  Sickness with abdominal problems, vomiting. Trees sneezing , itchy eyes.  Marland Kitchen Peach [Prunus Persica] Nausea And Vomiting    GI upset    I personally reviewed active problem list, medication list, allergies,  family history, social history, health maintenance, notes from last several encounters, lab results, endoscopy procedure notes with the patient/caregiver today.  Review of Systems  Constitutional: Negative.  Negative for activity change, appetite change, fatigue and unexpected weight change.  HENT: Negative.   Eyes: Negative.   Respiratory: Negative.  Negative for shortness of breath.   Cardiovascular: Negative.  Negative for chest pain, palpitations and leg swelling.  Gastrointestinal: Negative.  Negative for abdominal pain and blood in stool.  Endocrine: Negative.   Genitourinary: Negative.   Musculoskeletal: Negative.  Negative for arthralgias, gait problem, joint swelling and myalgias.  Skin: Negative.  Negative for color change, pallor and rash.  Allergic/Immunologic: Negative.   Neurological: Negative.  Negative for syncope and weakness.  Hematological: Negative.   Psychiatric/Behavioral: Positive for sleep disturbance. Negative for confusion, dysphoric mood, self-injury and suicidal ideas. The patient is not nervous/anxious.      Objective:    Vitals:   04/10/19 1425  BP: 126/74  Pulse: 96  Resp: 14  Temp: 97.7 F (36.5 C)  SpO2: 95%  Weight: 265 lb 8 oz (120.4 kg)  Height: 5\' 4"  (1.626 m)    Body mass index is 45.57 kg/m.  Physical Exam Vitals signs and nursing note reviewed.  Constitutional:      General: She is not in acute distress.    Appearance: Normal appearance. She is well-developed. She is obese. She is not ill-appearing, toxic-appearing or diaphoretic.  HENT:     Head: Normocephalic and atraumatic.     Right Ear: External ear normal.     Left Ear: External ear normal.     Nose: Nose normal.     Mouth/Throat:     Mouth: Mucous membranes are moist.     Pharynx: Oropharynx is clear. Uvula midline. No oropharyngeal exudate or posterior oropharyngeal erythema.  Eyes:     General: Lids are normal. No scleral icterus.    Conjunctiva/sclera: Conjunctivae  normal.  Pupils: Pupils are equal, round, and reactive to light.  Neck:     Musculoskeletal: Normal range of motion and neck supple.     Thyroid: No thyroid mass, thyromegaly or thyroid tenderness.     Trachea: Phonation normal. No tracheal deviation.  Cardiovascular:     Rate and Rhythm: Normal rate and regular rhythm.     Pulses: Normal pulses.          Radial pulses are 2+ on the right side and 2+ on the left side.       Posterior tibial pulses are 2+ on the right side and 2+ on the left side.     Heart sounds: Normal heart sounds. No murmur. No friction rub. No gallop.   Pulmonary:     Effort: Pulmonary effort is normal. No respiratory distress.     Breath sounds: Normal breath sounds. No stridor. No wheezing, rhonchi or rales.  Chest:     Chest wall: No tenderness.  Abdominal:     General: Bowel sounds are normal. There is no distension.     Palpations: Abdomen is soft.     Tenderness: There is no abdominal tenderness. There is no guarding or rebound.  Musculoskeletal: Normal range of motion.        General: No deformity.     Right lower leg: No edema.     Left lower leg: No edema.  Lymphadenopathy:     Cervical: No cervical adenopathy.  Skin:    General: Skin is warm and dry.     Capillary Refill: Capillary refill takes less than 2 seconds.     Coloration: Skin is not jaundiced or pale.     Findings: No rash.  Neurological:     Mental Status: She is alert and oriented to person, place, and time.     Motor: No abnormal muscle tone.     Gait: Gait normal.  Psychiatric:        Mood and Affect: Mood normal.        Speech: Speech normal.        Behavior: Behavior normal.      No results found for this or any previous visit (from the past 2160 hour(s)).  Diabetic Foot Exam: Diabetic Foot Exam - Simple   No data filed       PHQ2/9: Depression screen [facility] 2/9 04/10/2019  Decreased Interest 0  Down, Depressed, Hopeless 2  PHQ - 2 Score 2  Altered sleeping 0   Tired, decreased energy 3  Change in appetite 0  Feeling bad or failure about yourself  0  Trouble concentrating 3  Moving slowly or fidgety/restless 0  Suicidal thoughts 0  PHQ-9 Score 8  Difficult doing work/chores Somewhat difficult    phq 9 is positive Being treated for depression  Fall Risk: Fall Risk  04/10/2019  Falls in the past year? 0  Number falls in past yr: 0  Injury with Fall? 0    Functional Status Survey: Is the patient deaf or have difficulty hearing?: No Does the patient have difficulty seeing, even when wearing glasses/contacts?: No Does the patient have difficulty concentrating, remembering, or making decisions?: No Does the patient have difficulty walking or climbing stairs?: No Does the patient have difficulty dressing or bathing?: No Does the patient have difficulty doing errands alone such as visiting a doctor's office or shopping?: No    Assessment & Plan:       ICD-10-CM   1. Hypothyroidism, unspecified type  E03.9  recent dx, started synthroid 50 mcg "three weeks ago", increase to 100 mcg poqam on empty stomach, TSH in 3 weeks  2. Hyperlipidemia, unspecified hyperlipidemia type  E78.5    last LDL 105 02/2019, working on lifestyle, cooks at home, did nutritional counseling before, continue lifestyle efforts, recheck 6 months  3. Essential hypertension  I10    At goal today, managed with diet and exercise  4. Prediabetes  R73.03    last per records review 6.0 02/2019, 6.2 10/2018, continue low glycemic index, f/up in 6 months  5. Anemia, unspecified type  D64.9    menorrhagia, going to have TAH-BSO in 2 months, supplementing iron, going to hematology for consult prior to surgery  6. Major depressive disorder with current active episode, unspecified depression episode severity, unspecified whether recurrent  F32.9    well controlled on trintellix, refills given  7. Gastroesophageal reflux disease, esophagitis presence not specified  K21.9     protonix can be moved to PM if taken 2 hours after last meal, sx well controlled, does see GI  8. Allergic rhinitis, unspecified seasonality, unspecified trigger  J30.9    well controlled  9. OSA on CPAP  G47.33    Z99.89   10. Prothrombin gene mutation (HCC)  D68.52    (prior portal vein DVT - per hematology anticoagulants for pregnancy/surgery/stasis, but does not require daily) - going to hematology   11. Lynch syndrome  Z15.09    recent dx for pt, sister + for lynch and ovarian CA, increased risk of CRC, endometrial/uterine, ovarian, urinary tract  12. Class 3 severe obesity with serious comorbidity and body mass index (BMI) of 45.0 to 49.9 in adult, unspecified obesity type (HCC)  E66.01    Z68.42    continue diet/exercise, new hypothyroid tx, may consider endocrine or weight management referral later next year if weight increasing  13. Encounter to establish care with new doctor  Z76.89    over 200 pages of records, labs, imaging results recieved and reviewed, care everywhere records and labs also reviewed today   Spent from 2:40 pm to 3:40 pm with pt in the exam room, additional time was spent reviewing records as noted previously.   Return in about 3 weeks (around 05/01/2019) for third to fourth week Oct come back for labs only, CPE when due.   Danelle Berry, PA-C 04/10/19 2:51 PM

## 2019-04-11 ENCOUNTER — Encounter: Payer: Self-pay | Admitting: Family Medicine

## 2019-04-17 DIAGNOSIS — Z1509 Genetic susceptibility to other malignant neoplasm: Secondary | ICD-10-CM | POA: Diagnosis not present

## 2019-05-11 DIAGNOSIS — E039 Hypothyroidism, unspecified: Secondary | ICD-10-CM | POA: Diagnosis not present

## 2019-05-11 LAB — TSH: TSH: 1.77 mIU/L

## 2019-05-15 ENCOUNTER — Other Ambulatory Visit: Payer: Self-pay | Admitting: Family Medicine

## 2019-05-15 ENCOUNTER — Telehealth: Payer: Self-pay | Admitting: Family Medicine

## 2019-05-15 MED ORDER — LEVOTHYROXINE SODIUM 100 MCG PO TABS: 100.0000 ug | ORAL_TABLET | Freq: Every day | ORAL | 0 refills | Status: DC

## 2019-05-15 NOTE — Telephone Encounter (Signed)
Pt called and stated that she missed a call from [place]. Pt states that she is taking levothyroxine 100 mcg daily in the morning.

## 2019-05-18 DIAGNOSIS — Z01818 Encounter for other preprocedural examination: Secondary | ICD-10-CM | POA: Diagnosis not present

## 2019-05-18 DIAGNOSIS — Z1509 Genetic susceptibility to other malignant neoplasm: Secondary | ICD-10-CM | POA: Diagnosis not present

## 2019-06-22 ENCOUNTER — Encounter
Admission: RE | Admit: 2019-06-22 | Discharge: 2019-06-22 | Disposition: A | Discharge status: Home / Self Care | Payer: BC Managed Care – PPO | Source: Ambulatory Visit | Attending: Obstetrics & Gynecology | Admitting: Obstetrics & Gynecology

## 2019-06-22 ENCOUNTER — Other Ambulatory Visit: Payer: Self-pay

## 2019-06-22 DIAGNOSIS — Z20828 Contact with and (suspected) exposure to other viral communicable diseases: Secondary | ICD-10-CM | POA: Insufficient documentation

## 2019-06-22 DIAGNOSIS — Z01818 Encounter for other preprocedural examination: Secondary | ICD-10-CM | POA: Diagnosis not present

## 2019-06-22 DIAGNOSIS — G4733 Obstructive sleep apnea (adult) (pediatric): Secondary | ICD-10-CM | POA: Diagnosis not present

## 2019-06-22 HISTORY — DX: Sleep apnea, unspecified: G47.30

## 2019-06-22 HISTORY — DX: Hypothyroidism, unspecified: E03.9

## 2019-06-22 HISTORY — DX: Family history of other specified conditions: Z84.89

## 2019-06-22 LAB — CBC
HCT: 34.1 % — ABNORMAL LOW (ref 36.0–46.0)
Hemoglobin: 10.9 g/dL — ABNORMAL LOW (ref 12.0–15.0)
MCH: 22.9 pg — ABNORMAL LOW (ref 26.0–34.0)
MCHC: 32 g/dL (ref 30.0–36.0)
MCV: 71.8 fL — ABNORMAL LOW (ref 80.0–100.0)
Platelets: 296 10*3/uL (ref 150–400)
RBC: 4.75 MIL/uL (ref 3.87–5.11)
RDW: 16.5 % — ABNORMAL HIGH (ref 11.5–15.5)
WBC: 7.5 10*3/uL (ref 4.0–10.5)
nRBC: 0 % (ref 0.0–0.2)

## 2019-06-22 LAB — BASIC METABOLIC PANEL
Anion gap: 9 (ref 5–15)
BUN: 13 mg/dL (ref 6–20)
CO2: 27 mmol/L (ref 22–32)
Calcium: 9.3 mg/dL (ref 8.9–10.3)
Chloride: 103 mmol/L (ref 98–111)
Creatinine, Ser: 0.92 mg/dL (ref 0.44–1.00)
GFR calc Af Amer: >60 mL/min (ref >60)
GFR calc non Af Amer: >60 mL/min (ref >60)
Glucose, Bld: 122 mg/dL — ABNORMAL HIGH (ref 70–99)
Potassium: 4.4 mmol/L (ref 3.5–5.1)
Sodium: 139 mmol/L (ref 135–145)

## 2019-06-22 LAB — TYPE AND SCREEN
ABO/RH(D): O NEG
Antibody Screen: NEGATIVE

## 2019-06-22 NOTE — Patient Instructions (Signed)
Your procedure is scheduled on: Monday 06/26/19 Report to DAY SURGERY DEPARTMENT LOCATED ON 2ND FLOOR MEDICAL MALL ENTRANCE. To find out your arrival time please call [phone] between 1PM - 3PM on Friday 06/23/19.  Remember: Instructions that are not followed completely may result in serious medical risk, up to and including death, or upon the discretion of your surgeon and anesthesiologist your surgery may need to be rescheduled.     _X__ 1. Do not eat food after midnight the night before your procedure.                 No gum chewing or hard candies. You may drink clear liquids up to 2 hours                 before you are scheduled to arrive for your surgery- DO not drink clear                 liquids within 2 hours of the start of your surgery.                 Clear Liquids include:  water, apple juice without pulp, clear carbohydrate                 drink such as Clearfast or Gatorade, Black Coffee or Tea (Do not add                 anything to coffee or tea). Diabetics water only ENSURE CARB DRINK 2 HOURS PRIOR TO ARRIVAL  __X__2.  On the morning of surgery brush your teeth with toothpaste and water, you                 may rinse your mouth with mouthwash if you wish.  Do not swallow any              toothpaste of mouthwash.     _X__ 3.  No Alcohol for 24 hours before or after surgery.   _X__ 4.  Do Not Smoke or use e-cigarettes For 24 Hours Prior to Your Surgery.                 Do not use any chewable tobacco products for at least 6 hours prior to                 surgery.  ____  5.  Bring all medications with you on the day of surgery if instructed.   __X__  6.  Notify your doctor if there is any change in your medical condition      (cold, fever, infections).     Do not wear jewelry, make-up, hairpins, clips or nail polish. Do not wear lotions, powders, or perfumes.  Do not shave 48 hours prior to surgery. Men may shave face and neck. Do not bring valuables to the  hospital.    [facility] is not responsible for any belongings or valuables.  Contacts, dentures/partials or body piercings may not be worn into surgery. Bring a case for your contacts, glasses or hearing aids, a denture cup will be supplied. Leave your suitcase in the car. After surgery it may be brought to your room. For patients admitted to the hospital, discharge time is determined by your treatment team.   Patients discharged the day of surgery will not be allowed to drive home.   Please read over the following fact sheets that you were given:   MRSA Information  __X__ Take these medicines the  morning of surgery with A SIP OF WATER:    1. cetirizine (ZYRTEC) 10 MG tablet  2. levothyroxine (SYNTHROID) 100 MCG tablet  3. pantoprazole (PROTONIX) 40 MG tablet  4. TRINTELLIX 10 MG TABS tablet  5.  6.  ____ Fleet Enema (as directed)   __X__ Use CHG Soap/SAGE wipes as directed  ____ Use inhalers on the day of surgery  ____ Stop metformin/Janumet/Farxiga 2 days prior to surgery    ____ Take 1/2 of usual insulin dose the night before surgery. No insulin the morning          of surgery.   ____ Stop Blood Thinners Coumadin/Plavix/Xarelto/Pleta/Pradaxa/Eliquis/Effient/Aspirin  on   Or contact your Surgeon, Cardiologist or Medical Doctor regarding  ability to stop your blood thinners  __X__ Stop Anti-inflammatories 7 days before surgery such as Advil, Ibuprofen, Motrin,  BC or Goodies Powder, Naprosyn, Naproxen, Aleve, Aspirin    __X__ Stop all herbal supplements, fish oil or vitamin E until after surgery.    ____ Bring C-Pap to the hospital.

## 2019-06-23 LAB — SARS CORONAVIRUS 2 (TAT 6-24 HRS): SARS Coronavirus 2: NEGATIVE

## 2019-06-25 MED ORDER — DEXTROSE 5 % IV SOLN
3.0000 g | INTRAVENOUS | Status: AC
  Administered 2019-06-26 (×3): 3 g via INTRAVENOUS
  Filled 2019-06-25: qty 3000
  Filled 2019-06-25: qty 3

## 2019-06-26 ENCOUNTER — Ambulatory Visit
Admission: RE | Admit: 2019-06-26 | Discharge: 2019-06-27 | Disposition: A | Discharge status: Home / Self Care | Payer: BC Managed Care – PPO | Attending: Obstetrics & Gynecology | Admitting: Obstetrics & Gynecology

## 2019-06-26 ENCOUNTER — Encounter: Payer: Self-pay | Admitting: Obstetrics & Gynecology

## 2019-06-26 ENCOUNTER — Ambulatory Visit: Payer: BC Managed Care – PPO

## 2019-06-26 ENCOUNTER — Encounter
Admission: RE | Disposition: A | Discharge status: Home / Self Care | Payer: Self-pay | Source: Home / Self Care | Attending: Obstetrics & Gynecology

## 2019-06-26 ENCOUNTER — Ambulatory Visit: Payer: BC Managed Care – PPO | Admitting: Certified Registered Nurse Anesthetist

## 2019-06-26 DIAGNOSIS — N83202 Unspecified ovarian cyst, left side: Secondary | ICD-10-CM | POA: Insufficient documentation

## 2019-06-26 DIAGNOSIS — Z4002 Encounter for prophylactic removal of ovary: Secondary | ICD-10-CM | POA: Diagnosis not present

## 2019-06-26 DIAGNOSIS — I10 Essential (primary) hypertension: Secondary | ICD-10-CM | POA: Diagnosis not present

## 2019-06-26 DIAGNOSIS — S3710XA Unspecified injury of ureter, initial encounter: Secondary | ICD-10-CM | POA: Insufficient documentation

## 2019-06-26 DIAGNOSIS — S3720XA Unspecified injury of bladder, initial encounter: Secondary | ICD-10-CM | POA: Insufficient documentation

## 2019-06-26 DIAGNOSIS — S3729XA Other injury of bladder, initial encounter: Secondary | ICD-10-CM | POA: Diagnosis not present

## 2019-06-26 DIAGNOSIS — Z1509 Genetic susceptibility to other malignant neoplasm: Secondary | ICD-10-CM | POA: Insufficient documentation

## 2019-06-26 DIAGNOSIS — J309 Allergic rhinitis, unspecified: Secondary | ICD-10-CM | POA: Diagnosis not present

## 2019-06-26 DIAGNOSIS — K219 Gastro-esophageal reflux disease without esophagitis: Secondary | ICD-10-CM | POA: Diagnosis not present

## 2019-06-26 DIAGNOSIS — Z79899 Other long term (current) drug therapy: Secondary | ICD-10-CM | POA: Insufficient documentation

## 2019-06-26 DIAGNOSIS — N72 Inflammatory disease of cervix uteri: Secondary | ICD-10-CM | POA: Insufficient documentation

## 2019-06-26 DIAGNOSIS — Z299 Encounter for prophylactic measures, unspecified: Secondary | ICD-10-CM | POA: Diagnosis not present

## 2019-06-26 DIAGNOSIS — G4730 Sleep apnea, unspecified: Secondary | ICD-10-CM | POA: Diagnosis not present

## 2019-06-26 DIAGNOSIS — N838 Other noninflammatory disorders of ovary, fallopian tube and broad ligament: Secondary | ICD-10-CM | POA: Diagnosis not present

## 2019-06-26 DIAGNOSIS — Z466 Encounter for fitting and adjustment of urinary device: Secondary | ICD-10-CM | POA: Diagnosis not present

## 2019-06-26 DIAGNOSIS — N802 Endometriosis of fallopian tube: Secondary | ICD-10-CM | POA: Insufficient documentation

## 2019-06-26 DIAGNOSIS — Z7984 Long term (current) use of oral hypoglycemic drugs: Secondary | ICD-10-CM | POA: Insufficient documentation

## 2019-06-26 DIAGNOSIS — Z79890 Hormone replacement therapy (postmenopausal): Secondary | ICD-10-CM | POA: Diagnosis not present

## 2019-06-26 DIAGNOSIS — E039 Hypothyroidism, unspecified: Secondary | ICD-10-CM | POA: Insufficient documentation

## 2019-06-26 DIAGNOSIS — Z6841 Body Mass Index (BMI) 40.0 and over, adult: Secondary | ICD-10-CM | POA: Diagnosis not present

## 2019-06-26 DIAGNOSIS — Z791 Long term (current) use of non-steroidal anti-inflammatories (NSAID): Secondary | ICD-10-CM | POA: Diagnosis not present

## 2019-06-26 DIAGNOSIS — Y836 Removal of other organ (partial) (total) as the cause of abnormal reaction of the patient, or of later complication, without mention of misadventure at the time of the procedure: Secondary | ICD-10-CM | POA: Diagnosis not present

## 2019-06-26 DIAGNOSIS — R7303 Prediabetes: Secondary | ICD-10-CM | POA: Insufficient documentation

## 2019-06-26 DIAGNOSIS — Z960 Presence of urogenital implants: Secondary | ICD-10-CM

## 2019-06-26 DIAGNOSIS — Z7901 Long term (current) use of anticoagulants: Secondary | ICD-10-CM | POA: Insufficient documentation

## 2019-06-26 DIAGNOSIS — S3719XA Other injury of ureter, initial encounter: Secondary | ICD-10-CM | POA: Diagnosis not present

## 2019-06-26 DIAGNOSIS — N8311 Corpus luteum cyst of right ovary: Secondary | ICD-10-CM | POA: Diagnosis not present

## 2019-06-26 DIAGNOSIS — Z90710 Acquired absence of both cervix and uterus: Secondary | ICD-10-CM

## 2019-06-26 DIAGNOSIS — Z90722 Acquired absence of ovaries, bilateral: Secondary | ICD-10-CM

## 2019-06-26 DIAGNOSIS — Z4009 Encounter for prophylactic removal of other organ: Secondary | ICD-10-CM | POA: Insufficient documentation

## 2019-06-26 DIAGNOSIS — F329 Major depressive disorder, single episode, unspecified: Secondary | ICD-10-CM | POA: Diagnosis not present

## 2019-06-26 DIAGNOSIS — N9971 Accidental puncture and laceration of a genitourinary system organ or structure during a genitourinary system procedure: Secondary | ICD-10-CM | POA: Insufficient documentation

## 2019-06-26 HISTORY — PX: ROBOTIC ASSISTED TOTAL HYSTERECTOMY WITH BILATERAL SALPINGO OOPHERECTOMY: SHX6086

## 2019-06-26 HISTORY — PX: CYSTOSCOPY WITH STENT PLACEMENT: SHX5790

## 2019-06-26 HISTORY — PX: CYSTOSCOPY: SHX5120

## 2019-06-26 LAB — CBC
HCT: 33.8 % — ABNORMAL LOW (ref 36.0–46.0)
Hemoglobin: 10.7 g/dL — ABNORMAL LOW (ref 12.0–15.0)
MCH: 22.6 pg — ABNORMAL LOW (ref 26.0–34.0)
MCHC: 31.7 g/dL (ref 30.0–36.0)
MCV: 71.5 fL — ABNORMAL LOW (ref 80.0–100.0)
Platelets: 331 10*3/uL (ref 150–400)
RBC: 4.73 MIL/uL (ref 3.87–5.11)
RDW: 16.2 % — ABNORMAL HIGH (ref 11.5–15.5)
WBC: 18 10*3/uL — ABNORMAL HIGH (ref 4.0–10.5)
nRBC: 0 % (ref 0.0–0.2)

## 2019-06-26 LAB — CREATININE, SERUM
Creatinine, Ser: 1.08 mg/dL — ABNORMAL HIGH (ref 0.44–1.00)
GFR calc Af Amer: >60 mL/min (ref >60)
GFR calc non Af Amer: >60 mL/min (ref >60)

## 2019-06-26 LAB — POCT PREGNANCY, URINE: Preg Test, Ur: NEGATIVE

## 2019-06-26 SURGERY — HYSTERECTOMY, TOTAL, ROBOT-ASSISTED, LAPAROSCOPIC, WITH BILATERAL SALPINGO-OOPHORECTOMY
Anesthesia: General | Site: Ureter | Laterality: Right

## 2019-06-26 MED ORDER — PHENAZOPYRIDINE HCL 200 MG PO TABS
200.0000 mg | ORAL_TABLET | Freq: Three times a day (TID) | ORAL | Status: DC
  Administered 2019-06-27 (×2): 200 mg via ORAL
  Filled 2019-06-26 (×3): qty 1

## 2019-06-26 MED ORDER — DOCUSATE SODIUM 100 MG PO CAPS
100.0000 mg | ORAL_CAPSULE | Freq: Two times a day (BID) | ORAL | Status: DC
  Administered 2019-06-26 – 2019-06-27 (×2): 100 mg via ORAL
  Filled 2019-06-26 (×2): qty 1

## 2019-06-26 MED ORDER — ESMOLOL HCL 100 MG/10ML IV SOLN
INTRAVENOUS | Status: DC | PRN
  Administered 2019-06-26: 10 mg via INTRAVENOUS

## 2019-06-26 MED ORDER — HEPARIN SODIUM (PORCINE) 5000 UNIT/ML IJ SOLN
INTRAMUSCULAR | Status: AC
  Filled 2019-06-26: qty 1

## 2019-06-26 MED ORDER — PHENYLEPHRINE HCL (PRESSORS) 10 MG/ML IV SOLN
INTRAVENOUS | Status: DC | PRN
  Administered 2019-06-26 (×3): 100 ug via INTRAVENOUS
  Administered 2019-06-26: 200 ug via INTRAVENOUS

## 2019-06-26 MED ORDER — KETOROLAC TROMETHAMINE 30 MG/ML IJ SOLN
INTRAMUSCULAR | Status: AC
  Filled 2019-06-26: qty 1

## 2019-06-26 MED ORDER — BUPIVACAINE HCL (PF) 0.5 % IJ SOLN
INTRAMUSCULAR | Status: AC
  Filled 2019-06-26: qty 30

## 2019-06-26 MED ORDER — DEXAMETHASONE SODIUM PHOSPHATE 10 MG/ML IJ SOLN
4.0000 mg | INTRAMUSCULAR | Status: AC
  Administered 2019-06-26: 4 mg via INTRAVENOUS

## 2019-06-26 MED ORDER — FENTANYL CITRATE (PF) 100 MCG/2ML IJ SOLN
25.0000 ug | INTRAMUSCULAR | Status: AC | PRN
  Administered 2019-06-26 (×2): 25 ug via INTRAVENOUS

## 2019-06-26 MED ORDER — METHYLENE BLUE 0.5 % INJ SOLN
INTRAVENOUS | Status: DC | PRN
  Administered 2019-06-26: 5 mL

## 2019-06-26 MED ORDER — PROPOFOL 10 MG/ML IV BOLUS
INTRAVENOUS | Status: AC
  Filled 2019-06-26: qty 20

## 2019-06-26 MED ORDER — OXYCODONE HCL 5 MG PO TABS: 5.0000 mg | ORAL_TABLET | Freq: Three times a day (TID) | ORAL | 0 refills | Status: DC | PRN

## 2019-06-26 MED ORDER — PROPOFOL 500 MG/50ML IV EMUL
INTRAVENOUS | Status: AC
  Filled 2019-06-26: qty 50

## 2019-06-26 MED ORDER — ROCURONIUM BROMIDE 50 MG/5ML IV SOLN
INTRAVENOUS | Status: AC
  Filled 2019-06-26: qty 1

## 2019-06-26 MED ORDER — PHENAZOPYRIDINE HCL 100 MG PO TABS: 100.0000 mg | ORAL_TABLET | Freq: Three times a day (TID) | ORAL | 1 refills | Status: DC | PRN

## 2019-06-26 MED ORDER — LIDOCAINE HCL (CARDIAC) PF 100 MG/5ML IV SOSY
PREFILLED_SYRINGE | INTRAVENOUS | Status: DC | PRN
  Administered 2019-06-26: 100 mg via INTRATRACHEAL

## 2019-06-26 MED ORDER — ACETAMINOPHEN 10 MG/ML IV SOLN
INTRAVENOUS | Status: AC
  Filled 2019-06-26: qty 100

## 2019-06-26 MED ORDER — PROPOFOL 500 MG/50ML IV EMUL
INTRAVENOUS | Status: AC
  Filled 2019-06-26: qty 100

## 2019-06-26 MED ORDER — GABAPENTIN 300 MG PO CAPS
600.0000 mg | ORAL_CAPSULE | ORAL | Status: AC
  Administered 2019-06-26: 600 mg via ORAL

## 2019-06-26 MED ORDER — SODIUM CHLORIDE FLUSH 0.9 % IV SOLN
INTRAVENOUS | Status: AC
  Filled 2019-06-26: qty 10

## 2019-06-26 MED ORDER — HEPARIN SODIUM (PORCINE) 5000 UNIT/ML IJ SOLN
5000.0000 [IU] | INTRAMUSCULAR | Status: AC
  Administered 2019-06-26: 5000 [IU] via SUBCUTANEOUS

## 2019-06-26 MED ORDER — IBUPROFEN 800 MG PO TABS: 800.0000 mg | ORAL_TABLET | Freq: Four times a day (QID) | ORAL | 1 refills | Status: DC

## 2019-06-26 MED ORDER — FENTANYL CITRATE (PF) 100 MCG/2ML IJ SOLN
INTRAMUSCULAR | Status: AC
  Filled 2019-06-26: qty 2

## 2019-06-26 MED ORDER — PROPOFOL 10 MG/ML IV BOLUS
INTRAVENOUS | Status: DC | PRN
  Administered 2019-06-26: 200 mg via INTRAVENOUS

## 2019-06-26 MED ORDER — DEXAMETHASONE SODIUM PHOSPHATE 10 MG/ML IJ SOLN
INTRAMUSCULAR | Status: DC | PRN
  Administered 2019-06-26: 10 mg via INTRAVENOUS

## 2019-06-26 MED ORDER — PHENYLEPHRINE HCL (PRESSORS) 10 MG/ML IV SOLN
INTRAVENOUS | Status: AC
  Filled 2019-06-26: qty 1

## 2019-06-26 MED ORDER — EPHEDRINE SULFATE 50 MG/ML IJ SOLN
INTRAMUSCULAR | Status: AC
  Filled 2019-06-26: qty 1

## 2019-06-26 MED ORDER — ENSURE PRE-SURGERY PO LIQD
296.0000 mL | Freq: Once | ORAL | Status: DC
  Filled 2019-06-26: qty 296

## 2019-06-26 MED ORDER — ONDANSETRON HCL 4 MG/2ML IJ SOLN
INTRAMUSCULAR | Status: AC
  Filled 2019-06-26: qty 2

## 2019-06-26 MED ORDER — LIDOCAINE HCL (PF) 2 % IJ SOLN
INTRAMUSCULAR | Status: AC
  Filled 2019-06-26: qty 10

## 2019-06-26 MED ORDER — FLUORESCEIN SODIUM 10 % IV SOLN
INTRAVENOUS | Status: AC
  Filled 2019-06-26: qty 5

## 2019-06-26 MED ORDER — LIDOCAINE HCL 4 % MT SOLN
OROMUCOSAL | Status: DC | PRN
  Administered 2019-06-26: 4 mL via TOPICAL

## 2019-06-26 MED ORDER — ENOXAPARIN SODIUM 40 MG/0.4ML ~~LOC~~ SOLN
40.0000 mg | Freq: Two times a day (BID) | SUBCUTANEOUS | Status: DC
  Administered 2019-06-27: 40 mg via SUBCUTANEOUS
  Filled 2019-06-26: qty 0.4

## 2019-06-26 MED ORDER — SODIUM CHLORIDE 0.9 % IV SOLN
INTRAVENOUS | Status: DC
  Administered 2019-06-26 (×3): via INTRAVENOUS

## 2019-06-26 MED ORDER — SIMETHICONE 80 MG PO CHEW
160.0000 mg | CHEWABLE_TABLET | Freq: Four times a day (QID) | ORAL | Status: DC | PRN
  Administered 2019-06-26 – 2019-06-27 (×2): 160 mg via ORAL
  Filled 2019-06-26 (×2): qty 2

## 2019-06-26 MED ORDER — LACTATED RINGERS IV SOLN: INTRAVENOUS | Status: DC

## 2019-06-26 MED ORDER — PROPOFOL 500 MG/50ML IV EMUL
INTRAVENOUS | Status: DC | PRN
  Administered 2019-06-26: 130 ug/kg/min via INTRAVENOUS

## 2019-06-26 MED ORDER — SCOPOLAMINE 1 MG/3DAYS TD PT72
1.0000 | MEDICATED_PATCH | TRANSDERMAL | Status: DC
  Administered 2019-06-26: 1.5 mg via TRANSDERMAL

## 2019-06-26 MED ORDER — CEFAZOLIN SODIUM 1 G IJ SOLR
INTRAMUSCULAR | Status: AC
  Filled 2019-06-26: qty 30

## 2019-06-26 MED ORDER — ONDANSETRON HCL 4 MG/2ML IJ SOLN: 4.0000 mg | Freq: Four times a day (QID) | INTRAMUSCULAR | Status: DC | PRN

## 2019-06-26 MED ORDER — MIDAZOLAM HCL 2 MG/2ML IJ SOLN
INTRAMUSCULAR | Status: AC
  Filled 2019-06-26: qty 2

## 2019-06-26 MED ORDER — SUGAMMADEX SODIUM 500 MG/5ML IV SOLN
INTRAVENOUS | Status: AC
  Filled 2019-06-26: qty 5

## 2019-06-26 MED ORDER — CELECOXIB 200 MG PO CAPS
400.0000 mg | ORAL_CAPSULE | ORAL | Status: AC
  Administered 2019-06-26: 400 mg via ORAL

## 2019-06-26 MED ORDER — FENTANYL CITRATE (PF) 100 MCG/2ML IJ SOLN
25.0000 ug | INTRAMUSCULAR | Status: AC | PRN
  Administered 2019-06-26 (×4): 25 ug via INTRAVENOUS

## 2019-06-26 MED ORDER — SCOPOLAMINE 1 MG/3DAYS TD PT72
MEDICATED_PATCH | TRANSDERMAL | Status: AC
  Filled 2019-06-26: qty 1

## 2019-06-26 MED ORDER — LACTATED RINGERS IV SOLN
INTRAVENOUS | Status: DC
  Administered 2019-06-26: 10:00:00 via INTRAVENOUS

## 2019-06-26 MED ORDER — ACETAMINOPHEN 500 MG PO TABS
1000.0000 mg | ORAL_TABLET | ORAL | Status: AC
  Administered 2019-06-26: 1000 mg via ORAL

## 2019-06-26 MED ORDER — PROPOFOL 10 MG/ML IV BOLUS
INTRAVENOUS | Status: AC
  Filled 2019-06-26: qty 40

## 2019-06-26 MED ORDER — METHYLENE BLUE 0.5 % INJ SOLN
INTRAVENOUS | Status: AC
  Filled 2019-06-26: qty 10

## 2019-06-26 MED ORDER — MIDAZOLAM HCL 2 MG/2ML IJ SOLN
INTRAMUSCULAR | Status: DC | PRN
  Administered 2019-06-26: 2 mg via INTRAVENOUS

## 2019-06-26 MED ORDER — MENTHOL 3 MG MT LOZG
1.0000 | LOZENGE | OROMUCOSAL | Status: DC | PRN
  Filled 2019-06-26: qty 9

## 2019-06-26 MED ORDER — ACETAMINOPHEN 500 MG PO TABS
1000.0000 mg | ORAL_TABLET | Freq: Four times a day (QID) | ORAL | Status: DC
  Administered 2019-06-27 (×3): 1000 mg via ORAL
  Filled 2019-06-26 (×3): qty 2

## 2019-06-26 MED ORDER — OXYCODONE HCL 5 MG PO TABS
5.0000 mg | ORAL_TABLET | ORAL | Status: DC | PRN
  Administered 2019-06-26: 5 mg via ORAL
  Filled 2019-06-26: qty 1

## 2019-06-26 MED ORDER — KETOROLAC TROMETHAMINE 30 MG/ML IJ SOLN
30.0000 mg | Freq: Four times a day (QID) | INTRAMUSCULAR | Status: AC
  Administered 2019-06-27 (×2): 30 mg via INTRAVENOUS
  Filled 2019-06-26: qty 1

## 2019-06-26 MED ORDER — ONDANSETRON HCL 4 MG/2ML IJ SOLN
INTRAMUSCULAR | Status: DC | PRN
  Administered 2019-06-26 (×2): 4 mg via INTRAVENOUS

## 2019-06-26 MED ORDER — KETOROLAC TROMETHAMINE 30 MG/ML IJ SOLN
INTRAMUSCULAR | Status: DC | PRN
  Administered 2019-06-26: 30 mg via INTRAVENOUS

## 2019-06-26 MED ORDER — FLUORESCEIN SODIUM 10 % IV SOLN
INTRAVENOUS | Status: DC | PRN
  Administered 2019-06-26: 50 mg via INTRAVENOUS

## 2019-06-26 MED ORDER — ONDANSETRON HCL 4 MG PO TABS: 4.0000 mg | ORAL_TABLET | Freq: Four times a day (QID) | ORAL | Status: DC | PRN

## 2019-06-26 MED ORDER — ESMOLOL HCL 100 MG/10ML IV SOLN
INTRAVENOUS | Status: AC
  Filled 2019-06-26: qty 10

## 2019-06-26 MED ORDER — NITROFURANTOIN MONOHYD MACRO 100 MG PO CAPS: 100.0000 mg | ORAL_CAPSULE | Freq: Every day | ORAL | 0 refills | Status: AC

## 2019-06-26 MED ORDER — FENTANYL CITRATE (PF) 100 MCG/2ML IJ SOLN
INTRAMUSCULAR | Status: AC
  Administered 2019-06-26: 25 ug via INTRAVENOUS
  Filled 2019-06-26: qty 2

## 2019-06-26 MED ORDER — DEXAMETHASONE SODIUM PHOSPHATE 10 MG/ML IJ SOLN
INTRAMUSCULAR | Status: AC
  Filled 2019-06-26: qty 1

## 2019-06-26 MED ORDER — FENTANYL CITRATE (PF) 250 MCG/5ML IJ SOLN
INTRAMUSCULAR | Status: AC
  Filled 2019-06-26: qty 5

## 2019-06-26 MED ORDER — BUPIVACAINE HCL 0.5 % IJ SOLN
INTRAMUSCULAR | Status: DC | PRN
  Administered 2019-06-26: 10 mL

## 2019-06-26 MED ORDER — SODIUM CHLORIDE 0.9 % IV SOLN
INTRAVENOUS | Status: DC | PRN
  Administered 2019-06-26: 11:00:00 40 ug/min via INTRAVENOUS

## 2019-06-26 MED ORDER — ACETAMINOPHEN 500 MG PO TABS
ORAL_TABLET | ORAL | Status: AC
  Filled 2019-06-26: qty 2

## 2019-06-26 MED ORDER — SUGAMMADEX SODIUM 500 MG/5ML IV SOLN
INTRAVENOUS | Status: DC | PRN
  Administered 2019-06-26: 250 mg via INTRAVENOUS

## 2019-06-26 MED ORDER — FENTANYL CITRATE (PF) 250 MCG/5ML IJ SOLN
INTRAMUSCULAR | Status: DC | PRN
  Administered 2019-06-26 (×9): 50 ug via INTRAVENOUS
  Administered 2019-06-26: 100 ug via INTRAVENOUS

## 2019-06-26 MED ORDER — ROCURONIUM BROMIDE 100 MG/10ML IV SOLN
INTRAVENOUS | Status: DC | PRN
  Administered 2019-06-26: 10 mg via INTRAVENOUS
  Administered 2019-06-26 (×3): 20 mg via INTRAVENOUS
  Administered 2019-06-26: 10 mg via INTRAVENOUS
  Administered 2019-06-26: 20 mg via INTRAVENOUS
  Administered 2019-06-26 (×2): 30 mg via INTRAVENOUS
  Administered 2019-06-26: 50 mg via INTRAVENOUS
  Administered 2019-06-26: 10 mg via INTRAVENOUS
  Administered 2019-06-26: 20 mg via INTRAVENOUS

## 2019-06-26 MED ORDER — SEVOFLURANE IN SOLN
RESPIRATORY_TRACT | Status: AC
  Filled 2019-06-26: qty 250

## 2019-06-26 MED ORDER — KETOROLAC TROMETHAMINE 30 MG/ML IJ SOLN: 30.0000 mg | Freq: Once | INTRAMUSCULAR | Status: DC

## 2019-06-26 MED ORDER — PROMETHAZINE HCL 25 MG/ML IJ SOLN: 6.2500 mg | INTRAMUSCULAR | Status: DC | PRN

## 2019-06-26 MED ORDER — ACETAMINOPHEN 10 MG/ML IV SOLN
INTRAVENOUS | Status: DC | PRN
  Administered 2019-06-26: 1000 mg via INTRAVENOUS

## 2019-06-26 MED ORDER — CELECOXIB 200 MG PO CAPS
ORAL_CAPSULE | ORAL | Status: AC
  Filled 2019-06-26: qty 2

## 2019-06-26 MED ORDER — EPHEDRINE SULFATE 50 MG/ML IJ SOLN
INTRAMUSCULAR | Status: DC | PRN
  Administered 2019-06-26: 10 mg via INTRAVENOUS

## 2019-06-26 MED ORDER — KETOROLAC TROMETHAMINE 30 MG/ML IJ SOLN
30.0000 mg | Freq: Four times a day (QID) | INTRAMUSCULAR | Status: AC
  Filled 2019-06-26: qty 1

## 2019-06-26 MED ORDER — VASOPRESSIN 20 UNIT/ML IV SOLN
INTRAVENOUS | Status: AC
  Filled 2019-06-26: qty 1

## 2019-06-26 MED ORDER — GABAPENTIN 300 MG PO CAPS
ORAL_CAPSULE | ORAL | Status: AC
  Administered 2019-06-26: 600 mg
  Filled 2019-06-26: qty 2

## 2019-06-26 SURGICAL SUPPLY — 110 items
3 fr whistle tip ureteral ×4 IMPLANT
BAG URINE DRAIN 2000ML AR STRL (UROLOGICAL SUPPLIES) ×4 IMPLANT
BLADE SURG SZ11 CARB STEEL (BLADE) ×8 IMPLANT
BULB RESERV EVAC DRAIN JP 100C (MISCELLANEOUS) ×4 IMPLANT
CANISTER SUCT 1200ML W/VALVE (MISCELLANEOUS) ×4 IMPLANT
CATH FOLEY 2WAY  5CC 16FR (CATHETERS) ×1
CATH ROBINSON RED A/P 16FR (CATHETERS) ×4 IMPLANT
CATH URETL WHISTLE TIP 3FR (CATHETERS) ×4 IMPLANT
CATH URTH 16FR FL 2W BLN LF (CATHETERS) ×3 IMPLANT
CHLORAPREP W/TINT 26 (MISCELLANEOUS) ×4 IMPLANT
CLIP VESOLOCK LG 6/CT PURPLE (CLIP) ×4 IMPLANT
CORD BIP STRL DISP 12FT (MISCELLANEOUS) IMPLANT
COVER LIGHT HANDLE STERIS (MISCELLANEOUS) ×8 IMPLANT
COVER TIP SHEARS 8 DVNC (MISCELLANEOUS) ×3 IMPLANT
COVER TIP SHEARS 8MM DA VINCI (MISCELLANEOUS) ×1
COVER WAND RF STERILE (DRAPES) ×4 IMPLANT
CRADLE LAMINECT ARM (MISCELLANEOUS) ×4 IMPLANT
DEFOGGER SCOPE WARMER CLEARIFY (MISCELLANEOUS) ×4 IMPLANT
DERMABOND ADVANCED (GAUZE/BANDAGES/DRESSINGS) ×1
DERMABOND ADVANCED .7 DNX12 (GAUZE/BANDAGES/DRESSINGS) ×3 IMPLANT
DRAIN CHANNEL JP 19F (MISCELLANEOUS) ×4 IMPLANT
DRAIN JP 10F RND SILICONE (MISCELLANEOUS) IMPLANT
DRAPE 3/4 80X56 (DRAPES) ×4 IMPLANT
DRAPE ARM DVNC X/XI (DISPOSABLE) ×12 IMPLANT
DRAPE COLUMN DVNC XI (DISPOSABLE) ×3 IMPLANT
DRAPE DA VINCI XI ARM (DISPOSABLE) ×4
DRAPE DA VINCI XI COLUMN (DISPOSABLE) ×1
DRAPE LEGGINS SURG 28X43 STRL (DRAPES) ×4 IMPLANT
DRAPE SURG 17X11 SM STRL (DRAPES) ×12 IMPLANT
DRAPE UNDER BUTTOCK W/FLU (DRAPES) ×4 IMPLANT
DRIVER NDL 23- D MEGA 49.7X1.4 (INSTRUMENTS) IMPLANT
DRIVER NDLE LRG 8 DVNC XI (INSTRUMENTS) IMPLANT
DRIVER NEEDLE XI 8MM LRG DVNC (INSTRUMENTS)
DRSG TEGADERM 4X4.75 (GAUZE/BANDAGES/DRESSINGS) ×4 IMPLANT
DRVR NDL 23- D MEGA 49.7X1.4 (INSTRUMENTS)
ELECT REM PT RETURN 9FT ADLT (ELECTROSURGICAL) ×4
ELECTRODE REM PT RTRN 9FT ADLT (ELECTROSURGICAL) ×3 IMPLANT
FILTER LAP SMOKE EVAC STRL (MISCELLANEOUS) ×4 IMPLANT
GLIDEWIRE STR 0.035 150CM 3CM (WIRE) ×4 IMPLANT
GLOVE BIO SURGEON STRL SZ 6.5 (GLOVE) ×12 IMPLANT
GLOVE BIO SURGEON STRL SZ8 (GLOVE) ×8 IMPLANT
GLOVE INDICATOR 7.0 STRL GRN (GLOVE) ×4 IMPLANT
GLOVE PI ORTHOPRO 6.5 (GLOVE) ×4
GLOVE PI ORTHOPRO STRL 6.5 (GLOVE) ×12 IMPLANT
GLOVE SURG SYN 6.5 ES PF (GLOVE) ×72 IMPLANT
GOWN STRL REUS W/ TWL LRG LVL3 (GOWN DISPOSABLE) ×30 IMPLANT
GOWN STRL REUS W/TWL LRG LVL3 (GOWN DISPOSABLE) ×10
GOWN STRL REUS W/TWL XL LVL3 (GOWN DISPOSABLE) ×4 IMPLANT
GRASPER SUT TROCAR 14GX15 (MISCELLANEOUS) ×8 IMPLANT
GUIDEWIRE STR DUAL SENSOR (WIRE) ×4 IMPLANT
HANDLE YANKAUER SUCT BULB TIP (MISCELLANEOUS) ×4 IMPLANT
IRRIGATION STRYKERFLOW (MISCELLANEOUS) IMPLANT
IRRIGATOR STRYKERFLOW (MISCELLANEOUS)
IV NS 1000ML (IV SOLUTION) ×3
IV NS 1000ML BAXH (IV SOLUTION) ×9 IMPLANT
KIT IMAGING PINPOINTPAQ (MISCELLANEOUS) IMPLANT
KIT PINK PAD W/HEAD ARE REST (MISCELLANEOUS) ×4
KIT PINK PAD W/HEAD ARM REST (MISCELLANEOUS) ×3 IMPLANT
KIT TURNOVER CYSTO (KITS) ×4 IMPLANT
LABEL OR SOLS (LABEL) ×4 IMPLANT
MANIPULATOR VCARE LG CRV RETR (MISCELLANEOUS) IMPLANT
MANIPULATOR VCARE SML CRV RETR (MISCELLANEOUS) ×8 IMPLANT
MANIPULATOR VCARE STD CRV RETR (MISCELLANEOUS) IMPLANT
NDL INSUFF 14G 150MM VS150000 (NEEDLE) ×4 IMPLANT
NEEDLE HYPO 22GX1.5 SAFETY (NEEDLE) ×4 IMPLANT
NEEDLE VERESS 14GA 120MM (NEEDLE) ×4 IMPLANT
NS IRRIG 1000ML POUR BTL (IV SOLUTION) ×4 IMPLANT
OBTURATOR OPTICAL STANDARD 8MM (TROCAR) ×1
OBTURATOR OPTICAL STND 8 DVNC (TROCAR) ×3
OBTURATOR OPTICALSTD 8 DVNC (TROCAR) ×3 IMPLANT
PACK LAP CHOLECYSTECTOMY (MISCELLANEOUS) ×4 IMPLANT
PAD OB MATERNITY 4.3X12.25 (PERSONAL CARE ITEMS) ×4 IMPLANT
PAD PREP 24X41 OB/GYN DISP (PERSONAL CARE ITEMS) IMPLANT
PENCIL ELECTRO HAND CTR (MISCELLANEOUS) ×4 IMPLANT
SCISSORS METZENBAUM CVD 33 (INSTRUMENTS) ×4 IMPLANT
SEAL CANN UNIV 5-8 DVNC XI (MISCELLANEOUS) ×12 IMPLANT
SEAL XI 5MM-8MM UNIVERSAL (MISCELLANEOUS) ×4
SEALER VESSEL DA VINCI XI (MISCELLANEOUS)
SEALER VESSEL EXT DVNC XI (MISCELLANEOUS) IMPLANT
SET CYSTO W/LG BORE CLAMP LF (SET/KITS/TRAYS/PACK) ×4 IMPLANT
SLEEVE ENDOPATH XCEL 5M (ENDOMECHANICALS) ×4 IMPLANT
SOLUTION ELECTROLUBE (MISCELLANEOUS) ×4 IMPLANT
SPONGE DRAIN TRACH 4X4 STRL 2S (GAUZE/BANDAGES/DRESSINGS) ×4 IMPLANT
STENT URET 6FRX24 CONTOUR (STENTS) ×4 IMPLANT
SUT CUT NEEDLE DRIVER (INSTRUMENTS)
SUT DVC VLOC 180 0 12IN GS21 (SUTURE) ×8
SUT DVC VLOC 90 3-0 CV23 VLT (SUTURE) ×8
SUT ETHIBOND NAB CT1 #1 30IN (SUTURE) IMPLANT
SUT MNCRL 4-0 (SUTURE) ×1
SUT MNCRL 4-0 27XMFL (SUTURE) ×3
SUT VIC AB 0 CT1 36 (SUTURE) ×12 IMPLANT
SUT VIC AB 0 UR5 27 (SUTURE) ×4 IMPLANT
SUT VIC AB 1 CT1 36 (SUTURE) ×4 IMPLANT
SUT VIC AB 2-0 CT1 27 (SUTURE) ×1
SUT VIC AB 2-0 CT1 TAPERPNT 27 (SUTURE) ×3 IMPLANT
SUT VIC AB 2-0 SH 27 (SUTURE) ×1
SUT VIC AB 2-0 SH 27XBRD (SUTURE) ×3 IMPLANT
SUT VIC AB 3-0 SH 27 (SUTURE) ×8
SUT VIC AB 3-0 SH 27X BRD (SUTURE) ×24 IMPLANT
SUT VICRYL 0 AB UR-6 (SUTURE) ×8 IMPLANT
SUTURE DVC VLC 180 0 12IN GS21 (SUTURE) ×6 IMPLANT
SUTURE DVC VLC 90 3-0 CV23 VLT (SUTURE) ×6 IMPLANT
SUTURE MNCRL 4-0 27XMF (SUTURE) ×3 IMPLANT
SYR 10ML LL (SYRINGE) ×4 IMPLANT
SYR TOOMEY IRRIG 70ML (MISCELLANEOUS) ×4
SYRINGE TOOMEY IRRIG 70ML (MISCELLANEOUS) ×3 IMPLANT
TOWEL OR 17X26 4PK STRL BLUE (TOWEL DISPOSABLE) ×4 IMPLANT
TROCAR ENDO BLADELESS 11MM (ENDOMECHANICALS) ×4 IMPLANT
TROCAR XCEL NON-BLD 5MMX100MML (ENDOMECHANICALS) ×4 IMPLANT
TUBING EVAC SMOKE HEATED PNEUM (TUBING) IMPLANT

## 2019-06-26 NOTE — Anesthesia Preprocedure Evaluation (Signed)
Anesthesia Evaluation  Patient identified by MRN, date of birth, ID band Patient awake    Reviewed: Allergy & Precautions, H&P , NPO status , Patient's Chart, lab work & pertinent test results, reviewed documented beta blocker date and time   History of Anesthesia Complications (+) Family history of anesthesia reaction and history of anesthetic complications  Airway Mallampati: I  TM Distance: >3 FB Neck ROM: full    Dental no notable dental hx. (+) Dental Advidsory Given, Teeth Intact   Pulmonary neg shortness of breath, sleep apnea and Continuous Positive Airway Pressure Ventilation , neg COPD, neg recent URI,    Pulmonary exam normal        Cardiovascular Exercise Tolerance: Good negative cardio ROS Normal cardiovascular exam     Neuro/Psych PSYCHIATRIC DISORDERS Anxiety Depression negative neurological ROS     GI/Hepatic Neg liver ROS, GERD  ,  Endo/Other  neg diabetesHypothyroidism Morbid obesity  Renal/GU negative Renal ROS  negative genitourinary   Musculoskeletal   Abdominal   Peds  Hematology negative hematology ROS (+) Blood dyscrasia, anemia ,   Anesthesia Other Findings Past Medical History: No date: Anemia No date: Anxiety No date: Family history of adverse reaction to anesthesia     Comment:  malignant hyperthermia in 2 first cousins  No date: Family history of malignant hyperthermia No date: GERD (gastroesophageal reflux disease) No date: Hypothyroidism No date: Obesity affecting pregnancy No date: PCOS (polycystic ovarian syndrome) No date: Portal vein thrombosis 12/19/2014: Postpartum care following cesarean delivery No date: Preeclampsia in postpartum period No date: Pregnancy 12/16/2014: Previous cesarean delivery, delivered No date: Previous cesarean section No date: Prothrombin gene mutation ([place]) 12/17/2014: S/P cesarean section No date: Scalp psoriasis No date: Sleep apnea   Reproductive/Obstetrics negative OB ROS                             Anesthesia Physical  Anesthesia Plan  ASA: III  Anesthesia Plan: General   Post-op Pain Management:    Induction: Intravenous  PONV Risk Score and Plan: 3 and Propofol infusion, TIVA, Ondansetron, Dexamethasone and Midazolam  Airway Management Planned: Oral ETT  Additional Equipment:   Intra-op Plan:   Post-operative Plan: Extubation in OR  Informed Consent: I have reviewed the patients History and Physical, chart, labs and discussed the procedure including the risks, benefits and alternatives for the proposed anesthesia with the patient or authorized representative who has indicated his/her understanding and acceptance.       Plan Discussed with:   Anesthesia Plan Comments:         Anesthesia Quick Evaluation

## 2019-06-26 NOTE — H&P (Signed)
Preoperative History and Physical  Joanna Scott is a 43 y.o. [id number] here for surgical management of Lynch Syndrome.   No significant preoperative concerns.  Proposed surgery: TLH BSO  Past Medical History:  Diagnosis Date  . Anemia   . Anxiety   . Family history of adverse reaction to anesthesia    malignant hyperthermia in 2 first cousins   . Family history of malignant hyperthermia   . GERD (gastroesophageal reflux disease)   . Hypothyroidism   . Obesity affecting pregnancy   . PCOS (polycystic ovarian syndrome)   . Portal vein thrombosis   . Postpartum care following cesarean delivery 12/19/2014  . Preeclampsia in postpartum period   . Pregnancy   . Previous cesarean delivery, delivered 12/16/2014  . Previous cesarean section   . Prothrombin gene mutation ([place])   . S/P cesarean section 12/17/2014  . Scalp psoriasis   . Sleep apnea    Past Surgical History:  Procedure Laterality Date  . CESAREAN SECTION    . CESAREAN SECTION N/A 12/17/2014   Procedure: CESAREAN SECTION;  Surgeon: Will Bonnet, MD;  Location: [facility] ORS;  Service: Obstetrics;  Laterality: N/A;  . DIAGNOSTIC LAPAROSCOPY    . DILATION AND CURETTAGE OF UTERUS    . LAPAROSCOPIC CHOLECYSTECTOMY    . Lynch syndrome     OB History  Gravida Para Term Preterm AB Living  5 2 2  0 3 2  SAB TAB Ectopic Multiple Live Births  3 0 0 0 2    # Outcome Date GA Lbr Len/2nd Weight Sex Delivery Anes PTL Lv  5 Term 12/17/14 [redacted]w[redacted]d  3351 g M CS-LTranv Spinal  LIV  4 Term 07/16/09   2778 g M CS-LTranv Spinal N LIV     Complications: Failure to Progress in First Stage, Preeclampsia in postpartum period  3 SAB           2 SAB           1 SAB           Patient denies any other pertinent gynecologic issues.   No current facility-administered medications on file prior to encounter.   Current Outpatient Medications on File Prior to Encounter  Medication Sig Dispense Refill  . cetirizine (ZYRTEC) 10 MG tablet Take 10 mg by mouth  daily.     Marland Kitchen EPINEPHrine 0.3 mg/0.3 mL IJ SOAJ injection Inject 0.3 mg into the muscle as needed for anaphylaxis.   0  . levothyroxine (SYNTHROID) 100 MCG tablet Take 1 tablet (100 mcg total) by mouth daily. 90 tablet 0  . metFORMIN (GLUCOPHAGE-XR) 500 MG 24 hr tablet Take 3 tablets (1,500 mg total) by mouth 2 (two) times daily with a meal. (Patient taking differently: Take 1,000 mg by mouth at bedtime. ) 270 tablet 1  . pantoprazole (PROTONIX) 40 MG tablet Take 1 tablet (40 mg total) by mouth every morning. 90 tablet 1  . triamcinolone (NASACORT) 55 MCG/ACT AERO nasal inhaler Place 2 sprays into the nose daily.  12  . TRINTELLIX 10 MG TABS tablet Take 2 tablets (20 mg total) by mouth daily. 90 tablet 1   Allergies  Allergen Reactions  . Peach [Prunus Persica] Anaphylaxis and Nausea And Vomiting  . Tree Extract Itching    Sneezing and itchy eyes [name] and Oak    Social History:   reports that she has never smoked. She has never used smokeless tobacco. She reports current alcohol use. She reports that she does not use drugs.  Family History  Problem Relation Age of Onset  . Malignant hyperthermia Cousin   . Deep vein thrombosis Maternal Grandmother   . Hypertension Mother   . Depression Mother   . COPD Mother   . Ovarian cancer Sister   . Stroke Maternal Grandfather   . Heart attack Maternal Grandfather   . Malignant hyperthermia Other   . Encopresis Son     Review of Systems: Noncontributory  PHYSICAL EXAM: Blood pressure (!) 162/93, pulse 82, temperature 98.7 F (37.1 C), temperature source Tympanic, SpO2 99 %. General appearance - alert, well appearing, and in no distress Chest - clear to auscultation, no wheezes, rales or rhonchi, symmetric air entry Heart - normal rate and regular rhythm Abdomen - soft, nontender, nondistended, no masses or organomegaly Pelvic - examination not indicated Extremities - peripheral pulses normal, no pedal edema, no clubbing or  cyanosis  Labs: Results for orders placed or performed during the hospital encounter of 06/26/19 (from the past 336 hour(s))  Pregnancy, urine POC   Collection Time: 06/26/19  6:27 AM  Result Value Ref Range   Preg Test, Ur NEGATIVE NEGATIVE  Results for orders placed or performed during the hospital encounter of 06/22/19 (from the past 336 hour(s))  Basic metabolic panel   Collection Time: 06/22/19 10:07 AM  Result Value Ref Range   Sodium 139 135 - 145 mmol/L   Potassium 4.4 3.5 - 5.1 mmol/L   Chloride 103 98 - 111 mmol/L   CO2 27 22 - 32 mmol/L   Glucose, Bld 122 (H) 70 - 99 mg/dL   BUN 13 6 - 20 mg/dL   Creatinine, Ser 1.82 0.44 - 1.00 mg/dL   Calcium 9.3 8.9 - 99.3 mg/dL   GFR calc non Af Amer >60 >60 mL/min   GFR calc Af Amer >60 >60 mL/min   Anion gap 9 5 - 15  CBC   Collection Time: 06/22/19 10:07 AM  Result Value Ref Range   WBC 7.5 4.0 - 10.5 K/uL   RBC 4.75 3.87 - 5.11 MIL/uL   Hemoglobin 10.9 (L) 12.0 - 15.0 g/dL   HCT 71.6 (L) 96.7 - 89.3 %   MCV 71.8 (L) 80.0 - 100.0 fL   MCH 22.9 (L) 26.0 - 34.0 pg   MCHC 32.0 30.0 - 36.0 g/dL   RDW 81.0 (H) 17.5 - 10.2 %   Platelets 296 150 - 400 K/uL   nRBC 0.0 0.0 - 0.2 %  Type and screen [facility]   Collection Time: 06/22/19 10:07 AM  Result Value Ref Range   ABO/RH(D) O NEG    Antibody Screen NEG    Sample Expiration 07/06/2019,2359    Extend sample reason      NO TRANSFUSIONS OR PREGNANCY IN THE PAST 3 MONTHS Performed at [facility], [address] [place], [place] [address]   SARS CORONAVIRUS 2 (TAT 6-24 HRS) Nasopharyngeal Nasopharyngeal Swab   Collection Time: 06/22/19  1:00 PM   Specimen: Nasopharyngeal Swab  Result Value Ref Range   SARS Coronavirus 2 NEGATIVE NEGATIVE    Imaging Studies: No results found.  Assessment: Patient Active Problem List   Diagnosis Date Noted  . Hyperlipidemia 04/10/2019  . Essential hypertension 04/10/2019  . Prediabetes 04/10/2019   . Major depressive disorder with current active episode 04/10/2019  . Anemia 04/10/2019  . Gastroesophageal reflux disease 04/10/2019  . Allergic rhinitis 04/10/2019  . Bilateral polycystic ovarian syndrome 10/20/2012    Plan: Patient will undergo surgical management  with TLH BSO.   The risks of surgery were discussed in detail with the patient including but not limited to: bleeding which may require transfusion or reoperation; infection which may require antibiotics; injury to surrounding organs which may involve bowel, bladder, ureters ; need for additional procedures including laparoscopy or laparotomy; thromboembolic phenomenon, surgical site problems and other postoperative/anesthesia complications. Likelihood of success in alleviating the patient's condition was discussed. Routine postoperative instructions will be reviewed with the patient and her family in detail after surgery.  The patient concurred with the proposed plan, giving informed written consent for the surgery.  Patient has been NPO since last night she will remain NPO for procedure.  Anesthesia and OR aware.  Preoperative prophylactic antibiotics and SCDs ordered on call to the OR.  To OR when ready.  ----- Ranae Plumberhelsea Carnita Golob, MD, FACOG Attending Obstetrician and Gynecologist [facility], Department of OB/GYN [facility]  06/26/2019 7:17 AM

## 2019-06-26 NOTE — Anesthesia Post-op Follow-up Note (Signed)
Anesthesia QCDR form completed.        

## 2019-06-26 NOTE — Anesthesia Procedure Notes (Signed)
Procedure Name: Intubation Date/Time: 06/26/2019 10:07 AM Performed by: Frazier, Susan, CRNA Pre-anesthesia Checklist: Patient identified, Emergency Drugs available, Suction available and Patient being monitored Patient Re-evaluated:Patient Re-evaluated prior to induction Oxygen Delivery Method: Circle system utilized Preoxygenation: Pre-oxygenation with 100% oxygen Induction Type: IV induction Ventilation: Mask ventilation without difficulty Laryngoscope Size: McGraph and 3 Grade View: Grade I Tube type: Oral Tube size: 7.5 mm Number of attempts: 1 Airway Equipment and Method: Stylet,  Video-laryngoscopy and LTA kit utilized Placement Confirmation: ETT inserted through vocal cords under direct vision,  positive ETCO2 and breath sounds checked- equal and bilateral Secured at: 20 cm Tube secured with: Tape Dental Injury: Teeth and Oropharynx as per pre-operative assessment        

## 2019-06-26 NOTE — Transfer of Care (Signed)
Immediate Anesthesia Transfer of Care Note  Patient: Joanna Scott  Procedure(s) Performed: XI ROBOTIC ASSISTED TOTAL HYSTERECTOMY WITH BILATERAL SALPINGO OOPHORECTOMY (Bilateral Abdomen) CYSTOSCOPY CYSTOSCOPY WITH STENT PLACEMENT (Right Ureter)  Patient Location: PACU  Anesthesia Type:General  Level of Consciousness: awake  Airway & Oxygen Therapy: Patient connected to face mask oxygen  Post-op Assessment: Post -op Vital signs reviewed and stable  Post vital signs: stable  Last Vitals:  Vitals Value Taken Time  BP 126/82 06/26/19 1921  Temp    Pulse 116 06/26/19 1921  Resp 27 06/26/19 1921  SpO2 99 % 06/26/19 1921  Vitals shown include unvalidated device data.  Last Pain:  Vitals:   06/26/19 0630  TempSrc: Tympanic         Complications: No apparent anesthesia complications

## 2019-06-26 NOTE — Op Note (Addendum)
Preoperative diagnosis:  1. Left ureteral injury 2. Cystotomy   Postoperative diagnosis:  1. same   Procedure: 1. Robotic left ureteral reimplant 2. Robotic Cystotomy closure  Surgeon: Ardis Hughs, MD 1st assistant: Hollice Espy, MD  Anesthesia: General  Complications: None  Intraoperative findings:  The patient's left ureter at the the distal end was extravasating urine and had evidence of thermal injury resulting from the previous procedure of a robotic assisted hysterectomy.  In addition, there was approximately a 2 cm cystotomy the posterior wall of the bladder in the trigonal region.   EBL: Minimal  Specimens: None  Indication: Joanna Scott is a 43 y.o. patient with left ureteral injury during a robotic assisted laparoscopic hysterectomy.  After reviewing the management options for treatment, he elected to proceed with the above surgical procedure(s). We have discussed the potential benefits and risks of the procedure, side effects of the proposed treatment, the likelihood of the patient achieving the goals of the procedure, and any potential problems that might occur during the procedure or recuperation. Informed consent has been obtained.  Description of procedure:  An assistant was required for this surgical procedure.  The duties of the assistant included but were not limited to suctioning, passing suture, camera manipulation, retraction. This procedure would not be able to be performed without an Environmental consultant.   The patient had undergone a robotic assisted hysterectomy by Dr. Leonides Schanz, and there was some complicating factors including anatomy and uterine bleeding resulting in a left ureteral injury and cystotomy.  Urology was consulted intraoperatively for further management.  We initially placed the laparoscope into the previous trocar located through the umbilicus and we were able to visualize the ureteral injury as well as the bladder injury.  At this time we opted to  redocked the the robot and perform a robotic assisted left ureteral reimplant and cystotomy closure.  We filled up the patient's bladder with methylene blue and quickly located the opening within the bladder which was in the trigonal region left of midline.  This was closed using a 2 oh V-Loc suture in a running fashion in 2 layers.  We also closed the anterior aspect of the patient's vagina incision.  We then filled the bladder up with 240 cc of fluid and noted no significant leak.  I then isolated the left ureter and mobilized the distal aspect.  We then placed a Weck clip at the the UVJ and transected the ureter at the level of the previously seen injury.  I then mobilized the bladder on the left aspect and with tension on the ureter sized up the area for the cystotomy.  I anchored the base of the ureter to the bladder using a 3-0 Vicryl.  I then freshened up the edges of the ureter to healthy bleeding mucosa and spatulated it.  I then performed a cystotomy.  I then started the ureterovesical reanastomosis at the apex of the spatulation and ran the closure with 2 sutures up to the apex using 3-0 Vicryl.  Just prior to finishing the ureterovesicle anastomosis I advanced a sensor wire intraperitoneally up into the left renal pelvis and then passed a 24 cm time 6 French double-J ureteral stent.  This was advanced as far as it would go and then the wire was removed talking the distal aspect of the stent curl into the patient's bladder.  The cystotomy closure was then completed.  I then closed the vesicoureteral anastomosis with a second layer.  We then filled the bladder  again with 240 cc of fluid and noted no significant leak or extravasation of fluid.  The Foley catheter was then reinserted.  I then placed a 19 [place] Blake change in the pelvis and pulled this through the most lateral left trocar incision.  At this point the robot was then undocked and the assistant port on the right lateral aspect was closed  with a Leverne Humbles needle and a 0 Vicryl.  The remaining aspects of the incisions were closed with a 4-0 Monocryl subcuticular stitch and Dermabond was applied.  The patient was subsequently extubated and returned to the PACU in good condition. Crist Fat, M.D.

## 2019-06-26 NOTE — Op Note (Addendum)
06/26/2019  PATIENT:  Joanna Scott  43 y.o. female  PRE-OPERATIVE DIAGNOSIS:  lynch syndrome - prophylaxis  POST-OPERATIVE DIAGNOSIS:  lynch syndrome - prophylaxis  PROCEDURE:  Procedure(s): XI ROBOTIC ASSISTED TOTAL HYSTERECTOMY WITH BILATERAL SALPINGO OOPHORECTOMY (Bilateral) CYSTOSCOPY CYSTOSCOPY WITH STENT PLACEMENT (Right)  SURGEON:  Surgeon(s) and Role:    * Berkeley Vanaken, Elenora Fender, MD - Primary    * Schermerhorn, Ihor Austin, MD    * Crist Fat, MD    * Christeen Douglas, MD    * Vanna Scotland, MD  ANESTHESIA: GET  EBL:  200cc See flowsheet  DRAINS: foley to gravity, LLQ JP  SPECIMEN: Uterus, Cervix, bilateral tubes and bilateral ovaries  DISPOSITION OF SPECIMEN:  To pathology  COUNTS: correct x2  COMPLICATIONS: Left ureteral injury, repaired intra-op  PATIENT DISPOSITION:  VS stable to PACU    Indication for surgery: Patient had presented with diagnosis of Lynch Syndrome.  Various treatment options were discussed and patient requested a hysterectomy and removal of tubes and ovaries.. Due to her medical co-morbidities, a robotic approach was preferred.  Risks benefits and alternatives were reviewed and informed consent was obtained.   Procedure: The patient was brought to the OR and identified as [employer].  She was given general anesthesia via endotracheal route.  Nasogastric tube was placed.  She was then positioned in the dorsal lithotomy position and prepped and draped in the usual sterile fashion.  A surgical time-out was called. A foley catheter was placed.  A speculum was placed in the vagina and the cervix was visualized, grasped with a single tooth tenaculum and the V-Care uterine manipulator was placed in and around the cervix.  After a change of gloves, the attention was turned to the abdomen. An umbilical incision was made, the subcutaneous tissues were dissected, the fascia was divided, the peritoneum entered, and robotic trochar was inserted.   Pneumoperitoneum was created to . Two Robotic trochars were inserted atraumatically under visualization.  The patient was placed in steep trendelenburg, and the daVinci robot was docked and monopolar scissors and fenestrated bipolar grasper were employed.  A brief survey of the upper abdomen was performed.  The attention was turned to the pelvis.  The bilateral round ligaments were cauterized and transected.  The anterior peritoneum was divided and a bladder flap was created.  The posterior peritoneum was divided to expose the bilateral IP ligaments.  Bilateral ureters were identified.  The ovarian vessels were doubly burned and transected.  The underlying peritoneum was divided and the bilateral uterine arteries were skeletonized, sealed, and divided.  The cardinal and uterosacral ligaments were divided and a colpotomy was created around the cervical cup.  The uterus, cervix, tubes and ovaries were removed through the vagina and handed off to nursing to be sent to pathology.  The instruments were changed to needle drivers, and the vaginal cuff was closed using V-Lock in a running stitch.  The cuff was tested for integrity.    A cystoscopy was performed by inserting a 70-degree scope into the bladder and infusing 300cc normal saline.  The surface of the bladder appeared to be kinked on the left, and the left ureter did not eject fluorescin dye. The right ureter was patent.  The laparoscopic trochars were reinserted and the left ureter skeletonized and was found to be kinked via suture within the vaginal cuff closure. Cystoscopy was concurrently performed. The loops of V-lock were cut with cold scissors and this freed the kink, but exposed a hole in  the ureter and the bladder.  The defect was not found within the abdominal cavity and was noted to be in the vagina after blue dye was injected into the bladder.  A stent was guided through the left ureter without difficulty.  The attention was turned to the  vagina, which was deep.  Visualization was restricted, but the bladder defect was identified, and attempted to be closed with a running stitch of 3-0 vicryl, and then the vagina closed with 0-vicryl in a running stitch.   During this, the bladder was inflated with blue dyed saline several times and ultimately no spillage was noted.   At this time, urology scrubbed in and the robot was re-docked.  An 7mm and a 93mm assist trochar(s) were placed in addition to the robotic ports.  Dr. Louis Meckel has completed his own op note, but briefly, the defect in the bladder was not completely closed, so he completed this, and then reinforced the vaginal cuff closure.  The left ureter showed signs of damage from both the needle / suture as well as thermal damage.  The decision was made to trim the edge of the ureter and reinsert into the bladder.  A JJ stent was placed prior to closure of the neo-ureter, and a JP drain was placed prior to removal of laparoscopic instruments.    The laparoscopic instruments were removed, and the fascia of the 65mm trochar site was closed using the inlet closure device.  The remaining trochars were removed and the midline fascia was closed using 0-Vicryl.  The skin of all incision were closed with 4-0 monocryl and covered with surgical glue.  The procedure was then deemed complete.    The sponge, needle, and instrument counts were correct x2.  The patient tolerated reversal of anesthesia, and was brought to the PACU in a stable condition.  She will maintain a foley and JP drain x 10 days per urology.   I was present and performed this case in its entirety. Larey Days, MD Attending Obstetrician and Gynecologist [facility]

## 2019-06-26 NOTE — Discharge Summary (Signed)
Gynecology Discharge Summary  Patient ID: Joanna Scott MRN: [id number] DOB/AGE: May 06, 1976 43 y.o.  Admit Date: 06/26/2019 Discharge Date: 06/30/2019  Preoperative Diagnoses: Lynch syndrome Postoperative Diagnoses: Lynch syndrome, ureteral injury  Procedures: Procedure(s) (LRB): XI ROBOTIC ASSISTED TOTAL HYSTERECTOMY WITH BILATERAL SALPINGO OOPHORECTOMY (Bilateral) CYSTOSCOPY CYSTOSCOPY WITH STENT PLACEMENT, Cystotomy (Right)  CBC Latest Ref Rng & Units 06/27/2019 06/26/2019 06/22/2019  WBC 4.0 - 10.5 K/uL 16.0(H) 18.0(H) 7.5  Hemoglobin 12.0 - 15.0 g/dL 10.3(L) 10.7(L) 10.9(L)  Hematocrit 36.0 - 46.0 % 31.8(L) 33.8(L) 34.1(L)  Platelets 150 - 400 K/uL 346 331 296    Hospital Course:  Joanna Scott is a 43 y.o. [id number]  admitted for scheduled surgery.  She underwent the procedures as mentioned above, her operation was complicated by a left ureteral injury that was noted on cystoscopy. Urology was consulted and scrubbed to repair this with a neo-ureter.  For further details about surgery, please refer to the operative report. Patient had an uncomplicated postoperative course. By time of discharge on POD#1, her pain was controlled on oral pain medications; she was ambulating, tolerating regular diet and passing flatus. She maintained her foley catheter and JP drain. She was deemed stable for discharge to home.    Discharge Exam: BP 132/74 (BP Location: Left Arm)   Pulse 89   Temp 98.7 F (37.1 C) (Oral)   Resp 18   SpO2 96%  General appearance: alert and no distress  Resp: clear to auscultation bilaterally, normal respiratory effort Cardio: regular rate and rhythm  GI: soft, non-tender; bowel sounds normal; no masses, no organomegaly.  Incisions: C/D/I, no erythema, no drainage noted JP drain: 10cc Pelvic: scant blood on pad  Extremities: extremities normal, atraumatic, no cyanosis or edema and Homans sign is negative, no sign of DVT  Discharged Condition: Stable  Disposition:  Discharge disposition: 01-Home or Self Care       Discharge Instructions    Diet - low sodium heart healthy   Complete by: As directed    Increase activity slowly   Complete by: As directed      Allergies as of 06/27/2019      Reactions   Peach [prunus Persica] Anaphylaxis, Nausea And Vomiting   Tree Extract Itching   Sneezing and itchy eyes [name] and Oak      Medication List    TAKE these medications   cetirizine 10 MG tablet Commonly known as: ZYRTEC Take 10 mg by mouth daily.   enoxaparin 40 MG/0.4ML injection Commonly known as: LOVENOX Inject 0.4 mLs (40 mg total) into the skin daily.   EPINEPHrine 0.3 mg/0.3 mL Soaj injection Commonly known as: EPI-PEN Inject 0.3 mg into the muscle as needed for anaphylaxis.   ibuprofen 800 MG tablet Commonly known as: ADVIL Take 1 tablet (800 mg total) by mouth every 6 (six) hours.   levothyroxine 100 MCG tablet Commonly known as: SYNTHROID Take 1 tablet (100 mcg total) by mouth daily.   metFORMIN 500 MG 24 hr tablet Commonly known as: GLUCOPHAGE-XR Take 3 tablets (1,500 mg total) by mouth 2 (two) times daily with a meal. What changed:   how much to take  when to take this   nitrofurantoin (macrocrystal-monohydrate) 100 MG capsule Commonly known as: Macrobid Take 1 capsule (100 mg total) by mouth at bedtime for 10 days.   ondansetron 4 MG tablet Commonly known as: ZOFRAN Take 1 tablet (4 mg total) by mouth every 6 (six) hours as needed for nausea.   oxyCODONE 5 MG immediate release tablet  Commonly known as: Roxicodone Take 1 tablet (5 mg total) by mouth every 8 (eight) hours as needed.   pantoprazole 40 MG tablet Commonly known as: PROTONIX Take 1 tablet (40 mg total) by mouth every morning.   phenazopyridine 100 MG tablet Commonly known as: PYRIDIUM Take 1 tablet (100 mg total) by mouth 3 (three) times daily as needed for pain.   phenazopyridine 200 MG tablet Commonly known as: PYRIDIUM Take 1 tablet  (200 mg total) by mouth 3 (three) times daily with meals.   simethicone 80 MG chewable tablet Commonly known as: MYLICON Chew 2 tablets (160 mg total) by mouth 4 (four) times daily as needed for flatulence (abdominal RUQ pain / right shoulder pain).   triamcinolone 55 MCG/ACT Aero nasal inhaler Commonly known as: NASACORT Place 2 sprays into the nose daily.   Trintellix 10 MG Tabs tablet Generic drug: vortioxetine HBr Take 2 tablets (20 mg total) by mouth daily.      Follow-up Information    Rosselyn Martha, Elenora Fender, MD. Go in 2 week(s).   Specialty: Obstetrics and Gynecology Why: can be video visit December 28 @ 1130 if no complications. Contact information: [address] [place] [address] [phone]        Vanna Scotland, MD Follow up in 10 day(s).   Specialty: Urology Why: for cystogram, foley catheter removal, and drain removal. Office to call pt for appointment- pt aware. Contact information: [address] [place] [address] [phone]           Signed:  Elenora Fender Davonne Baby Attending Obstetrician & Gynecologist [facility]

## 2019-06-26 NOTE — Discharge Instructions (Signed)
Discharge instructions:  Call office if you have any of the following: fever >101 F, chills, shortness of breath, excessive vaginal bleeding, incision drainage or problems, leg pain or redness, or any other concerns.   Activity: Do not lift > 20 lbs for 8 weeks.  No intercourse or tampons for 8 weeks.  No driving for 1-2 weeks, or until you are certain you can slam on the brakes.  You may feel some pain in your upper right abdomen/rib and right shoulder.  This is from the gas in the abdomen for surgery. This will subside over time, please be patient!  Take 800mg  Ibuprofen and 1000mg  Tylenol around the clock, every 6 hours for at least the first 3-5 days.  After this you can take as needed.  This will help decrease inflammation and promote healing.  The narcotics you'll take just as needed, as they just trick your brain into thinking its not in pain.    You will be discharged with a foley catheter and abdominal drain for 10 days.  This will be removed by Urology, and an appointment will be made before you leave.  Pyridium 100mg  3x daily has been prescribed for you for the discomfort - it will turn your pee orange.  Like your glasses.  Also, an antibiotic to prevent infection has been ordered to take daily.    Please don't limit yourself in terms of routine activity.  You will be able to do most things, although they may take longer to do or be a little painful.  You can do it!  The leg bag and the drain will be bothersome, but should not be what limits you.  It is important with your prothrombin gene mutation that you are as active as possible.  If you find you are not able to be active, please let Dr Leonides Schanz know and Lovenox will be prescribed for you for a few days until you become more active.   Don't be a hero, but don't be a wimp either!  You got this!

## 2019-06-27 ENCOUNTER — Other Ambulatory Visit: Payer: Self-pay

## 2019-06-27 ENCOUNTER — Other Ambulatory Visit: Payer: Self-pay | Admitting: Radiology

## 2019-06-27 DIAGNOSIS — S3710XA Unspecified injury of ureter, initial encounter: Secondary | ICD-10-CM | POA: Diagnosis not present

## 2019-06-27 DIAGNOSIS — Z1509 Genetic susceptibility to other malignant neoplasm: Secondary | ICD-10-CM

## 2019-06-27 DIAGNOSIS — G4730 Sleep apnea, unspecified: Secondary | ICD-10-CM | POA: Diagnosis not present

## 2019-06-27 DIAGNOSIS — Z4009 Encounter for prophylactic removal of other organ: Secondary | ICD-10-CM | POA: Diagnosis not present

## 2019-06-27 DIAGNOSIS — E039 Hypothyroidism, unspecified: Secondary | ICD-10-CM | POA: Diagnosis not present

## 2019-06-27 DIAGNOSIS — R7303 Prediabetes: Secondary | ICD-10-CM | POA: Diagnosis not present

## 2019-06-27 DIAGNOSIS — F329 Major depressive disorder, single episode, unspecified: Secondary | ICD-10-CM | POA: Diagnosis not present

## 2019-06-27 DIAGNOSIS — N9971 Accidental puncture and laceration of a genitourinary system organ or structure during a genitourinary system procedure: Secondary | ICD-10-CM | POA: Diagnosis not present

## 2019-06-27 DIAGNOSIS — Z4002 Encounter for prophylactic removal of ovary: Secondary | ICD-10-CM | POA: Diagnosis not present

## 2019-06-27 DIAGNOSIS — K219 Gastro-esophageal reflux disease without esophagitis: Secondary | ICD-10-CM | POA: Diagnosis not present

## 2019-06-27 DIAGNOSIS — N802 Endometriosis of fallopian tube: Secondary | ICD-10-CM | POA: Diagnosis not present

## 2019-06-27 DIAGNOSIS — N83202 Unspecified ovarian cyst, left side: Secondary | ICD-10-CM | POA: Diagnosis not present

## 2019-06-27 DIAGNOSIS — S3720XA Unspecified injury of bladder, initial encounter: Secondary | ICD-10-CM | POA: Diagnosis not present

## 2019-06-27 DIAGNOSIS — N838 Other noninflammatory disorders of ovary, fallopian tube and broad ligament: Secondary | ICD-10-CM | POA: Diagnosis not present

## 2019-06-27 DIAGNOSIS — I10 Essential (primary) hypertension: Secondary | ICD-10-CM | POA: Diagnosis not present

## 2019-06-27 DIAGNOSIS — N72 Inflammatory disease of cervix uteri: Secondary | ICD-10-CM | POA: Diagnosis not present

## 2019-06-27 DIAGNOSIS — Z9889 Other specified postprocedural states: Secondary | ICD-10-CM

## 2019-06-27 LAB — BASIC METABOLIC PANEL
Anion gap: 9 (ref 5–15)
BUN: 10 mg/dL (ref 6–20)
CO2: 25 mmol/L (ref 22–32)
Calcium: 9.2 mg/dL (ref 8.9–10.3)
Chloride: 104 mmol/L (ref 98–111)
Creatinine, Ser: 1.01 mg/dL — ABNORMAL HIGH (ref 0.44–1.00)
GFR calc Af Amer: >60 mL/min (ref >60)
GFR calc non Af Amer: >60 mL/min (ref >60)
Glucose, Bld: 179 mg/dL — ABNORMAL HIGH (ref 70–99)
Potassium: 4.3 mmol/L (ref 3.5–5.1)
Sodium: 138 mmol/L (ref 135–145)

## 2019-06-27 LAB — CBC
HCT: 31.8 % — ABNORMAL LOW (ref 36.0–46.0)
Hemoglobin: 10.3 g/dL — ABNORMAL LOW (ref 12.0–15.0)
MCH: 23.1 pg — ABNORMAL LOW (ref 26.0–34.0)
MCHC: 32.4 g/dL (ref 30.0–36.0)
MCV: 71.3 fL — ABNORMAL LOW (ref 80.0–100.0)
Platelets: 346 10*3/uL (ref 150–400)
RBC: 4.46 MIL/uL (ref 3.87–5.11)
RDW: 16.5 % — ABNORMAL HIGH (ref 11.5–15.5)
WBC: 16 10*3/uL — ABNORMAL HIGH (ref 4.0–10.5)
nRBC: 0 % (ref 0.0–0.2)

## 2019-06-27 MED ORDER — VORTIOXETINE HBR 5 MG PO TABS
20.0000 mg | ORAL_TABLET | Freq: Every day | ORAL | Status: DC
  Administered 2019-06-27: 20 mg via ORAL
  Filled 2019-06-27: qty 4

## 2019-06-27 MED ORDER — SIMETHICONE 80 MG PO CHEW: 160.0000 mg | CHEWABLE_TABLET | Freq: Four times a day (QID) | ORAL | 0 refills | Status: DC | PRN

## 2019-06-27 MED ORDER — ENOXAPARIN SODIUM 40 MG/0.4ML ~~LOC~~ SOLN: 40.0000 mg | SUBCUTANEOUS | 0 refills | Status: DC

## 2019-06-27 MED ORDER — PHENAZOPYRIDINE HCL 200 MG PO TABS: 200.0000 mg | ORAL_TABLET | Freq: Three times a day (TID) | ORAL | 0 refills | Status: DC

## 2019-06-27 MED ORDER — ONDANSETRON HCL 4 MG PO TABS: 4.0000 mg | ORAL_TABLET | Freq: Four times a day (QID) | ORAL | 0 refills | Status: DC | PRN

## 2019-06-27 MED ORDER — LEVOTHYROXINE SODIUM 100 MCG PO TABS
100.0000 ug | ORAL_TABLET | Freq: Every day | ORAL | Status: DC
  Administered 2019-06-27: 100 ug via ORAL
  Filled 2019-06-27 (×2): qty 1

## 2019-06-27 MED ORDER — PANTOPRAZOLE SODIUM 40 MG PO TBEC
40.0000 mg | DELAYED_RELEASE_TABLET | ORAL | Status: DC
  Administered 2019-06-27: 40 mg via ORAL
  Filled 2019-06-27: qty 1

## 2019-06-27 NOTE — Anesthesia Postprocedure Evaluation (Signed)
Anesthesia Post Note  Patient: Museum/gallery conservator  Procedure(s) Performed: XI ROBOTIC ASSISTED TOTAL HYSTERECTOMY WITH BILATERAL SALPINGO OOPHORECTOMY (Bilateral Abdomen) CYSTOSCOPY CYSTOSCOPY WITH STENT PLACEMENT (Right Ureter)  Patient location during evaluation: PACU Anesthesia Type: General Level of consciousness: awake and alert Pain management: pain level controlled Vital Signs Assessment: post-procedure vital signs reviewed and stable Respiratory status: spontaneous breathing, nonlabored ventilation, respiratory function stable and patient connected to nasal cannula oxygen Cardiovascular status: blood pressure returned to baseline and stable Postop Assessment: no apparent nausea or vomiting Anesthetic complications: no     Last Vitals:  Vitals:   06/26/19 2229 06/26/19 2346  BP: 128/80 (!) 141/65  Pulse: (!) 106 (!) 111  Resp:    Temp: 37.2 C 37.1 C  SpO2: 98% 98%    Last Pain:  Vitals:   06/26/19 2346  TempSrc: Oral  PainSc:                  Joanna Scott

## 2019-06-27 NOTE — Progress Notes (Signed)
Patient discharged home with husband. Discharge instructions and prescriptions given and reviewed with patient. Educated on foley & JP drain care at home. Teach back method done and observed. Patient verbalized understanding. Escorted out by staff.

## 2019-06-27 NOTE — Progress Notes (Addendum)
Urology Inpatient Progress Note  Subjective: Joanna Scott is a 43 y.o. female admitted on 06/26/2019 for scheduled robotic assisted total hysterectomy with bilateral salpingo-oophorectomy for prophylactic management of Lynch syndrome.  She experienced an intraoperative left ureteral injury with cystotomy requiring robotic left ureteral reimplant and stent placement with cystotomy closure by Drs. Erlene Quan and Tselakai Dezza.  Creatinine stable today, 1.01.  WBC count down, 16.0.  Hemoglobin slightly down, 10.3.  Left-sided abdominal JP drain in place draining scant serosanguinous fluid.  Foley catheter in place draining light pink urine.  Patient reports the sensation of urinary urgency despite Foley in place.  She has no other significant complaints today.  Anti-infectives: Anti-infectives (From admission, onward)    Start     Dose/Rate Route Frequency Ordered Stop   06/26/19 0600  ceFAZolin (ANCEF) 3 g in dextrose 5 % 50 mL IVPB     3 g 100 mL/hr over 30 Minutes Intravenous On call to O.R. 06/25/19 2212 06/26/19 1758   06/26/19 0000  nitrofurantoin, macrocrystal-monohydrate, (MACROBID) 100 MG capsule     100 mg Oral Daily at bedtime 06/26/19 2110 07/06/19 2359       Current Facility-Administered Medications  Medication Dose Route Frequency Provider Last Rate Last Admin   0.9 %  sodium chloride infusion   Intravenous Continuous Ward, Honor Loh, MD 10 mL/hr at 06/26/19 0715 New Bag at 06/26/19 1201   acetaminophen (TYLENOL) tablet 1,000 mg  1,000 mg Oral Q6H Ward, Chelsea C, MD   1,000 mg at 06/27/19 1152   docusate sodium (COLACE) capsule 100 mg  100 mg Oral BID Ward, Chelsea C, MD   100 mg at 06/27/19 0949   enoxaparin (LOVENOX) injection 40 mg  40 mg Subcutaneous BID Ward, Honor Loh, MD   40 mg at 06/27/19 0950   lactated ringers infusion   Intravenous Continuous Ward, Honor Loh, MD   Stopped at 06/26/19 1827   lactated ringers infusion   Intravenous Continuous Ward, Honor Loh, MD       levothyroxine (SYNTHROID) tablet 100 mcg  100 mcg Oral Daily Ward, Honor Loh, MD   100 mcg at 06/27/19 9562   menthol-cetylpyridinium (CEPACOL) lozenge 3 mg  1 lozenge Oral Q2H PRN Ward, Honor Loh, MD       ondansetron (ZOFRAN) tablet 4 mg  4 mg Oral Q6H PRN Ward, Honor Loh, MD       Or   ondansetron (ZOFRAN) injection 4 mg  4 mg Intravenous Q6H PRN Ward, Honor Loh, MD       pantoprazole (PROTONIX) EC tablet 40 mg  40 mg Oral BH-q7a Ward, Honor Loh, MD   40 mg at 06/27/19 1308   phenazopyridine (PYRIDIUM) tablet 200 mg  200 mg Oral TID WC Ward, Chelsea C, MD   200 mg at 06/27/19 1338   simethicone (MYLICON) chewable tablet 160 mg  160 mg Oral QID PRN Ward, Honor Loh, MD   160 mg at 06/27/19 6578   vortioxetine HBr (TRINTELLIX) tablet 20 mg  20 mg Oral Daily Ward, Honor Loh, MD   20 mg at 06/27/19 4696   Current Outpatient Medications  Medication Sig Dispense Refill   cetirizine (ZYRTEC) 10 MG tablet Take 10 mg by mouth daily.      EPINEPHrine 0.3 mg/0.3 mL IJ SOAJ injection Inject 0.3 mg into the muscle as needed for anaphylaxis.   0   levothyroxine (SYNTHROID) 100 MCG tablet Take 1 tablet (100 mcg total) by mouth daily. 90 tablet 0   metFORMIN (GLUCOPHAGE-XR)  500 MG 24 hr tablet Take 3 tablets (1,500 mg total) by mouth 2 (two) times daily with a meal. (Patient taking differently: Take 1,000 mg by mouth at bedtime. ) 270 tablet 1   pantoprazole (PROTONIX) 40 MG tablet Take 1 tablet (40 mg total) by mouth every morning. 90 tablet 1   triamcinolone (NASACORT) 55 MCG/ACT AERO nasal inhaler Place 2 sprays into the nose daily.  12   TRINTELLIX 10 MG TABS tablet Take 2 tablets (20 mg total) by mouth daily. 90 tablet 1   enoxaparin (LOVENOX) 40 MG/0.4ML injection Inject 0.4 mLs (40 mg total) into the skin daily. 56 mL 0   ibuprofen (ADVIL) 800 MG tablet Take 1 tablet (800 mg total) by mouth every 6 (six) hours. 45 tablet 1   nitrofurantoin, macrocrystal-monohydrate, (MACROBID) 100 MG capsule Take 1  capsule (100 mg total) by mouth at bedtime for 10 days. 10 capsule 0   ondansetron (ZOFRAN) 4 MG tablet Take 1 tablet (4 mg total) by mouth every 6 (six) hours as needed for nausea. 20 tablet 0   oxyCODONE (ROXICODONE) 5 MG immediate release tablet Take 1 tablet (5 mg total) by mouth every 8 (eight) hours as needed. 21 tablet 0   phenazopyridine (PYRIDIUM) 100 MG tablet Take 1 tablet (100 mg total) by mouth 3 (three) times daily as needed for pain. 30 tablet 1   phenazopyridine (PYRIDIUM) 200 MG tablet Take 1 tablet (200 mg total) by mouth 3 (three) times daily with meals. 10 tablet 0   simethicone (MYLICON) 80 MG chewable tablet Chew 2 tablets (160 mg total) by mouth 4 (four) times daily as needed for flatulence (abdominal RUQ pain / right shoulder pain). 30 tablet 0   Objective: Vital signs in last 24 hours: Temp:  [97.7 F (36.5 C)-99.1 F (37.3 C)] 98.7 F (37.1 C) (12/15 1102) Pulse Rate:  [88-117] 89 (12/15 1102) Resp:  [12-22] 18 (12/15 1102) BP: (117-141)/(65-88) 132/74 (12/15 1102) SpO2:  [92 %-99 %] 96 % (12/15 0741)  Intake/Output from previous day: 12/14 0701 - 12/15 0700 In: 2450 [I.V.:2400; IV Piggyback:50] Out: 6230 [Urine:5850; Drains:80; Blood:300] Intake/Output this shift: Total I/O In: 240 [P.O.:240] Out: 1010 [Urine:1000; Drains:10]  Physical Exam Vitals and nursing note reviewed.  Constitutional:      General: She is not in acute distress.    Appearance: She is not ill-appearing, toxic-appearing or diaphoretic.  HENT:     Head: Normocephalic and atraumatic.  Pulmonary:     Effort: Pulmonary effort is normal. No respiratory distress.  Skin:    General: Skin is warm and dry.  Neurological:     Mental Status: She is alert and oriented to person, place, and time.  Psychiatric:        Mood and Affect: Mood normal.        Behavior: Behavior normal.    Lab Results:  Recent Labs    06/26/19 2312 06/27/19 0634  WBC 18.0* 16.0*  HGB 10.7* 10.3*  HCT  33.8* 31.8*  PLT 331 346   BMET Recent Labs    06/26/19 2312 06/27/19 0634  NA  --  138  K  --  4.3  CL  --  104  CO2  --  25  GLUCOSE  --  179*  BUN  --  10  CREATININE 1.08* 1.01*  CALCIUM  --  9.2   Assessment & Plan: 43 year old female s/p scheduled robotic assisted total hysterectomy with bilateral salpingo-oophorectomy for prophylactic management of Lynch syndrome who  experienced an intraoperative left ureteral injury with cystotomy requiring repair and left ureteral stent placement.  Patient was discharged home today with JP drain and Foley catheter still in place.  Due to low output, will bring patient into clinic this week for JP removal.  She will need to retain Foley catheter for approximately 10 days.  We will schedule patient for outpatient cystogram with subsequent clinic visit for cystogram results and possible Foley removal.  Finally, she will require stent removal with Dr. Apolinar Junes in 6 weeks.  Finally, will consider oxybutynin for management of bladder spasms.  Carman Ching, PA-C 06/27/2019  I have seen and examined the patient, labs/ imaging reviewed and discussed  management with Carman Ching. I reviewed the PA's note and agree with the documented findings and plan of care.

## 2019-06-28 ENCOUNTER — Other Ambulatory Visit: Payer: Self-pay | Admitting: Radiology

## 2019-06-28 ENCOUNTER — Other Ambulatory Visit: Payer: Self-pay | Admitting: Physician Assistant

## 2019-06-28 DIAGNOSIS — Z8049 Family history of malignant neoplasm of other genital organs: Secondary | ICD-10-CM | POA: Diagnosis not present

## 2019-06-28 DIAGNOSIS — Z1509 Genetic susceptibility to other malignant neoplasm: Secondary | ICD-10-CM | POA: Diagnosis not present

## 2019-06-28 DIAGNOSIS — Z9889 Other specified postprocedural states: Secondary | ICD-10-CM

## 2019-06-28 DIAGNOSIS — E119 Type 2 diabetes mellitus without complications: Secondary | ICD-10-CM | POA: Diagnosis not present

## 2019-06-29 LAB — SURGICAL PATHOLOGY

## 2019-06-30 ENCOUNTER — Ambulatory Visit (INDEPENDENT_AMBULATORY_CARE_PROVIDER_SITE_OTHER): Payer: BC Managed Care – PPO | Admitting: Physician Assistant

## 2019-06-30 ENCOUNTER — Other Ambulatory Visit: Payer: Self-pay

## 2019-06-30 ENCOUNTER — Ambulatory Visit
Admission: RE | Admit: 2019-06-30 | Discharge: 2019-06-30 | Disposition: A | Discharge status: Home / Self Care | Payer: BC Managed Care – PPO | Source: Ambulatory Visit | Attending: Physician Assistant | Admitting: Physician Assistant

## 2019-06-30 DIAGNOSIS — Z9889 Other specified postprocedural states: Secondary | ICD-10-CM | POA: Insufficient documentation

## 2019-06-30 DIAGNOSIS — Z4803 Encounter for change or removal of drains: Secondary | ICD-10-CM

## 2019-06-30 DIAGNOSIS — N3289 Other specified disorders of bladder: Secondary | ICD-10-CM | POA: Diagnosis not present

## 2019-06-30 MED ORDER — OXYBUTYNIN CHLORIDE 5 MG PO TABS: 5.0000 mg | ORAL_TABLET | Freq: Three times a day (TID) | ORAL | 0 refills | Status: DC | PRN

## 2019-06-30 MED ORDER — IOTHALAMATE MEGLUMINE 17.2 % UR SOLN
100.0000 mL | Freq: Once | URETHRAL | Status: AC | PRN
  Administered 2019-06-30: 100 mL via URETHRAL

## 2019-06-30 NOTE — Progress Notes (Signed)
Patient presented to clinic today for JP drain removal. Due to low drain output, I removed this in office today.  I used sterile scissors and forceps to snip the securing stitch, removed it, and then pulled the drain from her abdomen.  I recovered the incision with a gauze pad and secured in place with a Tegaderm.  I advised her that this bandage would come off on its own in the coming days.  Patient to retain Foley catheter with follow-up visit on 12/23.  I removed her night bag today, attached to leg bag, and provided her with an additional night bag for ease of use.  I taught her how to switch from one bag to another.  Patient expressed understanding.  Patient reports improved, however continued bladder spasms.  Prescribed oxybutynin 5 mg 3 times daily as needed for management of these.  Debroah Loop, PA-C  06/30/19 1:02 PM

## 2019-07-03 ENCOUNTER — Telehealth: Payer: Self-pay

## 2019-07-03 NOTE — Telephone Encounter (Signed)
Called patient to follow up on after hours nurse line call. She states she is doing much better with output from foley and the oxybutinin is helping her symptoms, she will keep her follow up on Wednesday

## 2019-07-04 NOTE — Progress Notes (Signed)
07/05/2019 11:55 AM   [name] [birth year] [id number]  Referring provider: Danelle Berryapia, Leisa, PA-C [address] [place],  [place] [id number]  Chief Complaint  Patient presents with  . Follow-up    HPI: [name] is a 43 year old female who is s/p left ureteral injury on 06/26/2019 who presents today for Foley removal.  Admitted on 06/26/2019 for scheduled robotic assisted total hysterectomy with bilateral salpingo-oophorectomy for prophylactic management of Lynch syndrome.  She experienced an intraoperative left ureteral injury with cystotomy requiring robotic left ureteral reimplant and stent placement with cystotomy closure by Drs. Apolinar JunesBrandon and [place].  Cystogram on 06/30/2019 noted no extravasation of contrast.    Today, she is feeling well.  Patient denies any gross hematuria, dysuria or suprapubic/flank pain.  Patient denies any fevers, chills, nausea or vomiting.   Foley in place draining orange urine.    PMH: Past Medical History:  Diagnosis Date  . Anemia   . Anxiety   . Family history of adverse reaction to anesthesia    malignant hyperthermia in 2 first cousins   . Family history of malignant hyperthermia   . GERD (gastroesophageal reflux disease)   . Hypothyroidism   . Obesity affecting pregnancy   . PCOS (polycystic ovarian syndrome)   . Portal vein thrombosis   . Postpartum care following cesarean delivery 12/19/2014  . Preeclampsia in postpartum period   . Pregnancy   . Previous cesarean delivery, delivered 12/16/2014  . Previous cesarean section   . Prothrombin gene mutation (HCC)   . S/P cesarean section 12/17/2014  . Scalp psoriasis   . Sleep apnea     Surgical History: Past Surgical History:  Procedure Laterality Date  . CESAREAN SECTION    . CESAREAN SECTION N/A 12/17/2014   Procedure: CESAREAN SECTION;  Surgeon: Conard NovakStephen D Jackson, MD;  Location: [facility] ORS;  Service: Obstetrics;  Laterality: N/A;  . CYSTOSCOPY  06/26/2019   Procedure:  CYSTOSCOPY;  Surgeon: Ward, Elenora Fenderhelsea C, MD;  Location: [facility] OR[facility];  Service: Gynecology;;  . Bluford KaufmannYSTOSCOPY WITH STENT PLACEMENT Right 06/26/2019   Procedure: CYSTOSCOPY WITH STENT PLACEMENT, Cystotomy;  Surgeon: Ward, Elenora Fenderhelsea C, MD;  Location: [facility] OR[facility];  Service: Gynecology;  Laterality: Right;  . DIAGNOSTIC LAPAROSCOPY    . DILATION AND CURETTAGE OF UTERUS    . LAPAROSCOPIC CHOLECYSTECTOMY    . Lynch syndrome    . ROBOTIC ASSISTED TOTAL HYSTERECTOMY WITH BILATERAL SALPINGO OOPHERECTOMY Bilateral 06/26/2019   Procedure: XI ROBOTIC ASSISTED TOTAL HYSTERECTOMY WITH BILATERAL SALPINGO OOPHORECTOMY;  Surgeon: Ward, Elenora Fenderhelsea C, MD;  Location: [facility] OR[facility];  Service: Gynecology;  Laterality: Bilateral;    Home Medications:  Allergies as of 07/05/2019      Reactions   Peach [prunus Persica] Anaphylaxis, Nausea And Vomiting   Tree Extract Itching   Sneezing and itchy eyes [facility]      Medication List       Accurate as of July 05, 2019 11:55 AM. If you have any questions, ask your nurse or doctor.        cetirizine 10 MG tablet Commonly known as: ZYRTEC Take 10 mg by mouth daily.   enoxaparin 40 MG/0.4ML injection Commonly known as: LOVENOX Inject 0.4 mLs (40 mg total) into the skin daily.   EPINEPHrine 0.3 mg/0.3 mL Soaj injection Commonly known as: EPI-PEN Inject 0.3 mg into the muscle as needed for anaphylaxis.   ibuprofen 800 MG tablet Commonly known as: ADVIL Take 1 tablet (800 mg total) by mouth every  6 (six) hours.   levothyroxine 100 MCG tablet Commonly known as: SYNTHROID Take 1 tablet (100 mcg total) by mouth daily.   metFORMIN 500 MG 24 hr tablet Commonly known as: GLUCOPHAGE-XR Take 500 mg PO qam with breakfast, and take 1000 mg PO qom with dinner or at bedtime What changed:   how much to take  how to take this  when to take this  additional instructions Changed by: Hollie Salk, RMA   nitrofurantoin (macrocrystal-monohydrate) 100 MG capsule Commonly  known as: Macrobid Take 1 capsule (100 mg total) by mouth at bedtime for 10 days.   ondansetron 4 MG tablet Commonly known as: ZOFRAN Take 1 tablet (4 mg total) by mouth every 6 (six) hours as needed for nausea.   oxybutynin 5 MG tablet Commonly known as: DITROPAN Take 1 tablet (5 mg total) by mouth every 8 (eight) hours as needed for bladder spasms.   oxyCODONE 5 MG immediate release tablet Commonly known as: Roxicodone Take 1 tablet (5 mg total) by mouth every 8 (eight) hours as needed.   pantoprazole 40 MG tablet Commonly known as: PROTONIX Take 1 tablet (40 mg total) by mouth every morning.   phenazopyridine 100 MG tablet Commonly known as: PYRIDIUM Take 1 tablet (100 mg total) by mouth 3 (three) times daily as needed for pain.   phenazopyridine 200 MG tablet Commonly known as: PYRIDIUM Take 1 tablet (200 mg total) by mouth 3 (three) times daily with meals.   simethicone 80 MG chewable tablet Commonly known as: MYLICON Chew 2 tablets (160 mg total) by mouth 4 (four) times daily as needed for flatulence (abdominal RUQ pain / right shoulder pain).   tamsulosin 0.4 MG Caps capsule Commonly known as: FLOMAX Take 1 capsule (0.4 mg total) by mouth daily. Started by: Zara Council, PA-C   triamcinolone 55 MCG/ACT Aero nasal inhaler Commonly known as: NASACORT Place 2 sprays into the nose daily.   Trintellix 10 MG Tabs tablet Generic drug: vortioxetine HBr Take 2 tablets (20 mg total) by mouth daily.       Allergies:  Allergies  Allergen Reactions  . Peach [Prunus Persica] Anaphylaxis and Nausea And Vomiting  . Tree Extract Itching    Sneezing and itchy eyes [name] and Oak    Family History: Family History  Problem Relation Age of Onset  . Malignant hyperthermia Cousin   . Deep vein thrombosis Maternal Grandmother   . Hypertension Mother   . Depression Mother   . COPD Mother   . Ovarian cancer Sister   . Stroke Maternal Grandfather   . Heart attack  Maternal Grandfather   . Malignant hyperthermia Other   . Encopresis Son     Social History:  reports that she has never smoked. She has never used smokeless tobacco. She reports current alcohol use. She reports that she does not use drugs.  ROS: UROLOGY Frequent Urination?: No Hard to postpone urination?: No Burning/pain with urination?: No Get up at night to urinate?: No Leakage of urine?: No Urine stream starts and stops?: No Trouble starting stream?: No Do you have to strain to urinate?: No Blood in urine?: No Urinary tract infection?: No Sexually transmitted disease?: No Injury to kidneys or bladder?: No Painful intercourse?: No Weak stream?: No Currently pregnant?: No Vaginal bleeding?: No Last menstrual period?: n  Gastrointestinal Nausea?: No Vomiting?: No Indigestion/heartburn?: Yes Diarrhea?: No Constipation?: No  Constitutional Fever: No Night sweats?: No Weight loss?: No Fatigue?: No  Skin Skin rash/lesions?: No Itching?: No  Eyes Blurred vision?: No Double vision?: No  Ears/Nose/Throat Sore throat?: No Sinus problems?: No  Hematologic/Lymphatic Swollen glands?: No Easy bruising?: No  Cardiovascular Leg swelling?: No Chest pain?: No  Respiratory Cough?: No Shortness of breath?: No  Endocrine Excessive thirst?: No  Musculoskeletal Back pain?: No Joint pain?: No  Neurological Headaches?: No Dizziness?: No  Psychologic Depression?: Yes Anxiety?: Yes  Physical Exam: BP 127/84   Pulse 85   Ht  (1.626 m)   Wt 266 lb 8 oz (120.9 kg)   BMI 45.74 kg/m   Constitutional:  Well nourished. Alert and oriented, No acute distress. HEENT: [place] AT, moist mucus membranes.  Trachea midline, no masses. Cardiovascular: No clubbing, cyanosis, or edema. Respiratory: Normal respiratory effort, no increased work of breathing. Neurologic: Grossly intact, no focal deficits, moving all 4 extremities. Psychiatric: Normal mood and  affect.  Laboratory Data: Lab Results  Component Value Date   WBC 16.0 (H) 06/27/2019   HGB 10.3 (L) 06/27/2019   HCT 31.8 (L) 06/27/2019   MCV 71.3 (L) 06/27/2019   PLT 346 06/27/2019    Lab Results  Component Value Date   CREATININE 1.01 (H) 06/27/2019    No results found for: PSA  No results found for: TESTOSTERONE  Lab Results  Component Value Date   HGBA1C 6.2 (H) 11/09/2018    Lab Results  Component Value Date   TSH 1.77 05/11/2019       Component Value Date/Time   CHOL 215 (H) 11/09/2018 0943   HDL 33 (L) 11/09/2018 0943   CHOLHDL 6.5 (H) 11/09/2018 0943   LDLCALC 142 (H) 11/09/2018 0943    Lab Results  Component Value Date   AST 34 11/09/2018   Lab Results  Component Value Date   ALT 26 11/09/2018   No components found for: ALKALINEPHOPHATASE No components found for: BILIRUBINTOTAL  No results found for: ESTRADIOL  Urinalysis    Component Value Date/Time   COLORURINE YELLOW (A) 03/22/2016 0819   APPEARANCEUR HAZY (A) 03/22/2016 0819   APPEARANCEUR Cloudy 09/04/2012 0056   LABSPEC 1.019 03/22/2016 0819   LABSPEC 1.025 09/04/2012 0056   PHURINE 5.0 03/22/2016 0819   GLUCOSEU NEGATIVE 03/22/2016 0819   GLUCOSEU Negative 09/04/2012 0056   HGBUR NEGATIVE 03/22/2016 0819   BILIRUBINUR NEGATIVE 03/22/2016 0819   BILIRUBINUR Negative 09/04/2012 0056   KETONESUR NEGATIVE 03/22/2016 0819   PROTEINUR NEGATIVE 03/22/2016 0819   NITRITE NEGATIVE 03/22/2016 0819   LEUKOCYTESUR TRACE (A) 03/22/2016 0819   LEUKOCYTESUR Trace 09/04/2012 0056    I have reviewed the labs.   Pertinent Imaging: CLINICAL DATA:  Evaluation of cystostomy repair in ureteral implant.  EXAM: CYSTOGRAM  TECHNIQUE: Through the existing Foley catheter the bladder was filled with 120 cc of 30% cystohypaque at which point patient felt that she needed to void. Serial spot images were obtained during bladder filling and post draining.  FLUOROSCOPY TIME:  Fluoroscopy  Time:  1 minutes 48 seconds  Radiation Exposure Index (if provided by the fluoroscopic device): 186.8 mGy  Number of Acquired Spot Images: 10  COMPARISON:  [facility]UB 06/26/2019  FINDINGS: Surgical clips right upper quadrant. Double-J left ureteral stent noted in good anatomic position. Drainage catheter noted over the pelvis. Foley catheter tip in the bladder. Contrast administered into the Foley without difficulty. Deformity noted of the lateral bladder walls. No contrast leakage noted. No intrinsic abnormality identified. Postvoid residual normal.  IMPRESSION: 1. Deformity noted of the lateral bladder walls. This is most likely postsurgical. No contrast  leakage noted. Postvoid residual normal.  2. Double-J left ureteral stent in good anatomic position. Drainage catheter noted over the pelvis.   Electronically Signed   By: Maisie Fus  Register   On: 06/30/2019 11:49 I have independently reviewed the films and do not note any extravasation of contrast.  Assessment & Plan:    1. Intraoperative left ureteral injury  with cystotomy requiring repair and left ureteral stent placement 06/26/2019 Foley removed today RTC on 08/08/2019 with Dr. Apolinar Junes for cystoscopy stent removal  Return for Keep cystoscopy appointment with Dr. Apolinar Junes on August 08, 2019.  These notes generated with voice recognition software. I apologize for typographical errors.  Michiel Cowboy, PA-C  [facility] [address]  [address] [place], [place] [address] [phone]

## 2019-07-05 ENCOUNTER — Other Ambulatory Visit: Payer: Self-pay

## 2019-07-05 ENCOUNTER — Other Ambulatory Visit: Payer: Self-pay | Admitting: Emergency Medicine

## 2019-07-05 ENCOUNTER — Encounter: Payer: Self-pay | Admitting: Urology

## 2019-07-05 ENCOUNTER — Ambulatory Visit (INDEPENDENT_AMBULATORY_CARE_PROVIDER_SITE_OTHER): Payer: BC Managed Care – PPO | Admitting: Urology

## 2019-07-05 VITALS — BP 127/84 | HR 85 | Ht 64.0 in | Wt 266.5 lb

## 2019-07-05 DIAGNOSIS — Z9889 Other specified postprocedural states: Secondary | ICD-10-CM

## 2019-07-05 MED ORDER — TAMSULOSIN HCL 0.4 MG PO CAPS: 0.4000 mg | ORAL_CAPSULE | Freq: Every day | ORAL | 3 refills | Status: DC

## 2019-07-05 MED ORDER — METFORMIN HCL ER 500 MG PO TB24: ORAL_TABLET | ORAL | 0 refills | Status: DC

## 2019-07-05 MED ORDER — OXYBUTYNIN CHLORIDE 5 MG PO TABS: 5.0000 mg | ORAL_TABLET | Freq: Three times a day (TID) | ORAL | 0 refills | Status: DC | PRN

## 2019-07-13 DIAGNOSIS — R509 Fever, unspecified: Secondary | ICD-10-CM | POA: Diagnosis not present

## 2019-07-28 ENCOUNTER — Other Ambulatory Visit: Payer: Self-pay | Admitting: Urology

## 2019-08-08 ENCOUNTER — Ambulatory Visit (INDEPENDENT_AMBULATORY_CARE_PROVIDER_SITE_OTHER): Payer: BC Managed Care – PPO | Admitting: Urology

## 2019-08-08 ENCOUNTER — Encounter: Payer: Self-pay | Admitting: Urology

## 2019-08-08 ENCOUNTER — Other Ambulatory Visit: Payer: Self-pay

## 2019-08-08 VITALS — BP 140/78 | HR 106 | Ht 64.0 in | Wt 266.0 lb

## 2019-08-08 DIAGNOSIS — Z4803 Encounter for change or removal of drains: Secondary | ICD-10-CM | POA: Diagnosis not present

## 2019-08-08 DIAGNOSIS — S3710XD Unspecified injury of ureter, subsequent encounter: Secondary | ICD-10-CM

## 2019-08-08 MED ORDER — CEFTRIAXONE SODIUM 1 G IJ SOLR
1.0000 g | Freq: Once | INTRAMUSCULAR | Status: AC
  Administered 2019-08-08: 1 g via INTRAMUSCULAR

## 2019-08-08 NOTE — Progress Notes (Signed)
   08/08/19  CC:  Chief Complaint  Patient presents with  . Cysto Stent Removal    HPI: 44 year old female who presents today for cystoscopy, stent removal following left ureteral reimplant.  Please see previous notes for details.  She underwent hysterectomy complicated by cystotomy and left distal ureteral injury requiring reimplantation.  She was treated for an E. coli urinary tract infection in late December.  Her fevers and severe dysuria improved.  She is currently taking Flomax and Ditropan for control of urinary symptoms.  Periprocedural ceftriaxone given today as a precaution.  Urine does not appear to be infected, consistent with stent.  Blood pressure 140/78, pulse (!) 106, height 5\' 4"  (1.626 m), weight 266 lb (120.7 kg). NED. A&Ox3.   No respiratory distress   Abd soft, NT, ND Normal external genitalia with patent urethral meatus  Cystoscopy/ Stent removal procedure  Patient identification was confirmed, informed consent was obtained, and patient was prepped using Betadine solution.  Lidocaine jelly was administered per urethral meatus.    Preoperative abx where received prior to procedure.    Procedure: - Flexible cystoscope introduced, without any difficulty.   - Thorough search of the bladder revealed:    normal urethral meatus  Stent seen emanating from left dome, grasped with stent graspers, and removed in entirety.     Post-Procedure: - Patient tolerated the procedure well   Assessment/ Plan:  1. Left ureteral injury, subsequent encounter For stent removal precautions reviewed today  We discussed her native anatomy including the risk of reflux and pyelonephritis related to nonrefluxing anastomosis.  We discussed possible complications including stricture or edema.  We will have her follow-up in 4 weeks with renal ultrasound prior.  She is agreeable this plan.  She may stop Flomax after few days and taper off Ditropan as needed. - Urinalysis,  Complete - cefTRIAXone (ROCEPHIN) injection 1 g    Return in about 4 weeks (around 09/05/2019) for RUS.  09/07/2019, MD

## 2019-08-09 LAB — MICROSCOPIC EXAMINATION

## 2019-08-09 LAB — URINALYSIS, COMPLETE
Bilirubin, UA: NEGATIVE
Glucose, UA: NEGATIVE
Ketones, UA: NEGATIVE
Nitrite, UA: NEGATIVE
Specific Gravity, UA: 1.02 (ref 1.005–1.030)
Urobilinogen, Ur: 0.2 mg/dL (ref 0.2–1.0)
pH, UA: 5 (ref 5.0–7.5)

## 2019-08-12 ENCOUNTER — Other Ambulatory Visit: Payer: Self-pay | Admitting: Urology

## 2019-08-18 ENCOUNTER — Other Ambulatory Visit: Payer: Self-pay | Admitting: Family Medicine

## 2019-08-21 ENCOUNTER — Telehealth: Payer: Self-pay

## 2019-08-21 MED ORDER — VORTIOXETINE HBR 20 MG PO TABS: 20.0000 mg | ORAL_TABLET | Freq: Every day | ORAL | 3 refills | Status: DC

## 2019-08-21 NOTE — Telephone Encounter (Signed)
Looks rx for trintellix written wrong. Take 2 tabs qd should be 60 pills for month supply.  Or 180 for 90 day

## 2019-08-21 NOTE — Telephone Encounter (Signed)
Copied from CRM [id number]. Topic: General - Other >> Aug 21, 2019  3:44 PM Joanna Scott wrote: Reason for CRM: Patient called to inquire if there was Scott mistake in the way her Rx for TRINTELLIX 10 MG TABS tablet was written was it to be for 90 days or the 90 tabs that it was written for. Asking for some clarification please. Ph#  [phone]

## 2019-09-01 DIAGNOSIS — Z20822 Contact with and (suspected) exposure to covid-19: Secondary | ICD-10-CM | POA: Diagnosis not present

## 2019-09-01 DIAGNOSIS — Z01812 Encounter for preprocedural laboratory examination: Secondary | ICD-10-CM | POA: Diagnosis not present

## 2019-09-04 DIAGNOSIS — G4730 Sleep apnea, unspecified: Secondary | ICD-10-CM | POA: Diagnosis not present

## 2019-09-04 DIAGNOSIS — E785 Hyperlipidemia, unspecified: Secondary | ICD-10-CM | POA: Diagnosis not present

## 2019-09-04 DIAGNOSIS — Z86010 Personal history of colonic polyps: Secondary | ICD-10-CM | POA: Diagnosis not present

## 2019-09-04 DIAGNOSIS — K319 Disease of stomach and duodenum, unspecified: Secondary | ICD-10-CM | POA: Diagnosis not present

## 2019-09-04 DIAGNOSIS — D124 Benign neoplasm of descending colon: Secondary | ICD-10-CM | POA: Diagnosis not present

## 2019-09-04 DIAGNOSIS — E039 Hypothyroidism, unspecified: Secondary | ICD-10-CM | POA: Diagnosis not present

## 2019-09-04 DIAGNOSIS — D126 Benign neoplasm of colon, unspecified: Secondary | ICD-10-CM | POA: Diagnosis not present

## 2019-09-04 DIAGNOSIS — D131 Benign neoplasm of stomach: Secondary | ICD-10-CM | POA: Diagnosis not present

## 2019-09-04 DIAGNOSIS — K635 Polyp of colon: Secondary | ICD-10-CM | POA: Diagnosis not present

## 2019-09-04 DIAGNOSIS — K219 Gastro-esophageal reflux disease without esophagitis: Secondary | ICD-10-CM | POA: Diagnosis not present

## 2019-09-04 DIAGNOSIS — Z1509 Genetic susceptibility to other malignant neoplasm: Secondary | ICD-10-CM | POA: Diagnosis not present

## 2019-09-04 DIAGNOSIS — Z86718 Personal history of other venous thrombosis and embolism: Secondary | ICD-10-CM | POA: Diagnosis not present

## 2019-09-04 DIAGNOSIS — R7303 Prediabetes: Secondary | ICD-10-CM | POA: Diagnosis not present

## 2019-09-04 DIAGNOSIS — K317 Polyp of stomach and duodenum: Secondary | ICD-10-CM | POA: Diagnosis not present

## 2019-09-04 DIAGNOSIS — Z1211 Encounter for screening for malignant neoplasm of colon: Secondary | ICD-10-CM | POA: Diagnosis not present

## 2019-09-04 DIAGNOSIS — I10 Essential (primary) hypertension: Secondary | ICD-10-CM | POA: Diagnosis not present

## 2019-09-05 ENCOUNTER — Other Ambulatory Visit: Payer: Self-pay | Admitting: *Deleted

## 2019-09-05 DIAGNOSIS — S3710XD Unspecified injury of ureter, subsequent encounter: Secondary | ICD-10-CM

## 2019-09-06 ENCOUNTER — Ambulatory Visit: Payer: Self-pay | Admitting: Urology

## 2019-09-14 ENCOUNTER — Encounter: Payer: Self-pay | Admitting: Family Medicine

## 2019-09-14 DIAGNOSIS — T7800XA Anaphylactic reaction due to unspecified food, initial encounter: Secondary | ICD-10-CM

## 2019-09-14 MED ORDER — EPINEPHRINE 0.3 MG/0.3ML IJ SOAJ: 0.3000 mg | INTRAMUSCULAR | 0 refills | Status: DC | PRN

## 2019-09-26 ENCOUNTER — Other Ambulatory Visit: Payer: Self-pay | Admitting: Urology

## 2019-09-26 ENCOUNTER — Other Ambulatory Visit: Payer: Self-pay | Admitting: Family Medicine

## 2019-09-27 ENCOUNTER — Ambulatory Visit
Admission: RE | Admit: 2019-09-27 | Discharge: 2019-09-27 | Disposition: A | Discharge status: Home / Self Care | Payer: BC Managed Care – PPO | Source: Ambulatory Visit | Attending: Urology | Admitting: Urology

## 2019-09-27 ENCOUNTER — Other Ambulatory Visit: Payer: Self-pay

## 2019-09-27 DIAGNOSIS — Z960 Presence of urogenital implants: Secondary | ICD-10-CM | POA: Diagnosis not present

## 2019-09-27 DIAGNOSIS — S3710XD Unspecified injury of ureter, subsequent encounter: Secondary | ICD-10-CM | POA: Diagnosis not present

## 2019-09-28 ENCOUNTER — Telehealth: Payer: Self-pay | Admitting: *Deleted

## 2019-09-28 NOTE — Telephone Encounter (Addendum)
Left Vm to return my call.  ----- Message from Vanna Scotland, MD sent at 09/27/2019  6:50 PM EDT ----- Renal ultrasound looks good.   Vanna Scotland, MD

## 2019-10-02 ENCOUNTER — Other Ambulatory Visit: Payer: Self-pay

## 2019-10-02 ENCOUNTER — Encounter: Payer: Self-pay | Admitting: Family Medicine

## 2019-10-02 ENCOUNTER — Ambulatory Visit: Payer: BC Managed Care – PPO | Admitting: Family Medicine

## 2019-10-02 VITALS — BP 126/84 | HR 100 | Temp 98.6°F | Resp 14 | Ht 64.0 in | Wt 252.3 lb

## 2019-10-02 DIAGNOSIS — D62 Acute posthemorrhagic anemia: Secondary | ICD-10-CM

## 2019-10-02 DIAGNOSIS — D6852 Prothrombin gene mutation: Secondary | ICD-10-CM

## 2019-10-02 DIAGNOSIS — Z90722 Acquired absence of ovaries, bilateral: Secondary | ICD-10-CM

## 2019-10-02 DIAGNOSIS — Z90710 Acquired absence of both cervix and uterus: Secondary | ICD-10-CM

## 2019-10-02 DIAGNOSIS — E039 Hypothyroidism, unspecified: Secondary | ICD-10-CM | POA: Diagnosis not present

## 2019-10-02 DIAGNOSIS — I10 Essential (primary) hypertension: Secondary | ICD-10-CM | POA: Diagnosis not present

## 2019-10-02 DIAGNOSIS — Z9079 Acquired absence of other genital organ(s): Secondary | ICD-10-CM

## 2019-10-02 DIAGNOSIS — E785 Hyperlipidemia, unspecified: Secondary | ICD-10-CM

## 2019-10-02 DIAGNOSIS — D649 Anemia, unspecified: Secondary | ICD-10-CM | POA: Diagnosis not present

## 2019-10-02 DIAGNOSIS — L219 Seborrheic dermatitis, unspecified: Secondary | ICD-10-CM

## 2019-10-02 DIAGNOSIS — F329 Major depressive disorder, single episode, unspecified: Secondary | ICD-10-CM

## 2019-10-02 DIAGNOSIS — E66813 Obesity, class 3: Secondary | ICD-10-CM

## 2019-10-02 DIAGNOSIS — R7303 Prediabetes: Secondary | ICD-10-CM | POA: Diagnosis not present

## 2019-10-02 DIAGNOSIS — R718 Other abnormality of red blood cells: Secondary | ICD-10-CM

## 2019-10-02 MED ORDER — PANTOPRAZOLE SODIUM 40 MG PO TBEC: 40.0000 mg | DELAYED_RELEASE_TABLET | ORAL | 1 refills | Status: DC

## 2019-10-02 MED ORDER — KETOCONAZOLE 2 % EX SHAM: 1.0000 "application " | MEDICATED_SHAMPOO | CUTANEOUS | 2 refills | Status: DC

## 2019-10-02 MED ORDER — METFORMIN HCL ER 500 MG PO TB24: 1000.0000 mg | ORAL_TABLET | Freq: Two times a day (BID) | ORAL | 3 refills | Status: DC

## 2019-10-02 NOTE — Progress Notes (Signed)
Name: Joanna Scott   MRN: [id number]    DOB: [birth year]   Date:10/02/2019       Progress Note  Chief Complaint  Patient presents with  . Follow-up    hystectomy and several complications that was invovled     Subjective:   Joanna Scott is a 44 y.o. female, presents to clinic for routine follow up on the conditions listed above.  Post op complications 06/26/2019 - perforation to bladder and ureter -she had these repaired by urology team but she did have several postop complications after that including being an indwelling Foley for several weeks and then getting a UTI.  She has since improved but she was told that part of her her anatomy which prevents reflux was cut out and she may be more prone to urinary tract infections  She also recently had her colonoscopy done 09/04/2019 colonoscopy/endoscopy done [facility] subspecialists who handle [place] syndrome -records were reviewed through care everywhere  She feels better lately despite the difficult surgery and postop complications and rehab she overall feels that her mood and her body are doing well  She has continued to take trintellux, she feelsthat mood, energy, focus is all feeling good. Depression screen [facility] 2/9 10/02/2019 04/10/2019  Decreased Interest 0 0  Down, Depressed, Hopeless 1 2  PHQ - 2 Score 1 2  Altered sleeping 0 0  Tired, decreased energy 2 3  Change in appetite 0 0  Feeling bad or failure about yourself  0 0  Trouble concentrating 0 3  Moving slowly or fidgety/restless 0 0  Suicidal thoughts 0 0  PHQ-9 Score 3 8  Difficult doing work/chores Not difficult at all Somewhat difficult   Obesity: She has been able to lose a little bit of weight Is not able to do a lot of exercise yet with recovery from surgery Wt Readings from Last 5 Encounters:  10/02/19 252 lb 4.8 oz (114.4 kg)  08/08/19 266 lb (120.7 kg)  07/05/19 266 lb 8 oz (120.9 kg)  06/22/19 269 lb (122 kg)  04/10/19 265 lb 8 oz (120.4 kg)   BMI Readings  from Last 5 Encounters:  10/02/19 43.31 kg/m  08/08/19 45.66 kg/m  07/05/19 45.74 kg/m  06/22/19 46.17 kg/m  04/10/19 45.57 kg/m   Had right UVJ cut out, will have reflux prone to UTI's  Had postop UTI with indwelling catheter  Hyperlipidemia: Current Medication Regimen: Not on any medications, will recheck with her next physical Last Lipids: Lab Results  Component Value Date   CHOL 215 (H) 11/09/2018   HDL 33 (L) 11/09/2018   LDLCALC 142 (H) 11/09/2018   TRIG 199 (H) 11/09/2018   CHOLHDL 6.5 (H) 11/09/2018   The 10-year ASCVD risk score Denman George DC Jr., et al., 2013) is: 1.9%   Values used to calculate the score:     Age: 54 years     Sex: Female     Is Non-Hispanic African American: No     Diabetic: No     Tobacco smoker: No     Systolic Blood Pressure: 126 mmHg     Is BP treated: No     HDL Cholesterol: 33 mg/dL     Total Cholesterol: 215 mg/dL Overall low ASCVD risk score   Hypertension:  Currently managed with diet and exercise Blood pressure today is well controlled. BP Readings from Last 3 Encounters:  10/02/19 126/84  08/08/19 140/78  07/05/19 127/84  Pt denies CP, SOB, exertional sx, LE edema, palpitation, Ha's,  visual disturbances,  No lightheadedness, hypotension, syncope.  Hypothyroidism: Current Medication Regimen: 100 mcg synthroid Takes medicine daily in the morning -she has been taking with her Protonix Current Symptoms: No current symptoms she denies any constipation other than with narcotic pain medicines following surgery, no weight increase, she denies palpitations, diarrhea, change in mood appetite hair or skin Most recent results TSH was normal, 05/11/2019 1.77; we will be repeating labs today.      Patient Active Problem List   Diagnosis Date Noted  . S/P total hysterectomy and bilateral salpingo-oophorectomy 06/26/2019  . Hyperlipidemia 04/10/2019  . Essential hypertension 04/10/2019  . Prediabetes 04/10/2019  . Major depressive  disorder with current active episode 04/10/2019  . Anemia 04/10/2019  . Gastroesophageal reflux disease 04/10/2019  . Allergic rhinitis 04/10/2019  . Bilateral polycystic ovarian syndrome 10/20/2012    Past Surgical History:  Procedure Laterality Date  . CESAREAN SECTION    . CESAREAN SECTION N/A 12/17/2014   Procedure: CESAREAN SECTION;  Surgeon: Will Bonnet, MD;  Location: [facility] ORS;  Service: Obstetrics;  Laterality: N/A;  . CYSTOSCOPY  06/26/2019   Procedure: CYSTOSCOPY;  Surgeon: Ward, Honor Loh, MD;  Location: [facility] ORS;  Service: Gynecology;;  . Consuela Mimes WITH STENT PLACEMENT Right 06/26/2019   Procedure: CYSTOSCOPY WITH STENT PLACEMENT, Cystotomy;  Surgeon: Ward, Honor Loh, MD;  Location: [facility] ORS;  Service: Gynecology;  Laterality: Right;  . DIAGNOSTIC LAPAROSCOPY    . DILATION AND CURETTAGE OF UTERUS    . LAPAROSCOPIC CHOLECYSTECTOMY    . Lynch syndrome    . ROBOTIC ASSISTED TOTAL HYSTERECTOMY WITH BILATERAL SALPINGO OOPHERECTOMY Bilateral 06/26/2019   Procedure: XI ROBOTIC ASSISTED TOTAL HYSTERECTOMY WITH BILATERAL SALPINGO OOPHORECTOMY;  Surgeon: Ward, Honor Loh, MD;  Location: [facility] ORS;  Service: Gynecology;  Laterality: Bilateral;    Family History  Problem Relation Age of Onset  . Malignant hyperthermia Cousin   . Deep vein thrombosis Maternal Grandmother   . Hypertension Mother   . Depression Mother   . COPD Mother   . Ovarian cancer Sister   . Stroke Maternal Grandfather   . Heart attack Maternal Grandfather   . Malignant hyperthermia Other   . Encopresis Son     Social History   Tobacco Use  . Smoking status: Never Smoker  . Smokeless tobacco: Never Used  Substance Use Topics  . Alcohol use: Yes  . Drug use: No      Current Outpatient Medications:  .  cetirizine (ZYRTEC) 10 MG tablet, Take 10 mg by mouth daily. , Disp: , Rfl:  .  EPINEPHrine 0.3 mg/0.3 mL IJ SOAJ injection, Inject 0.3 mLs (0.3 mg total) into the muscle as needed for  anaphylaxis., Disp: 1 each, Rfl: 0 .  ibuprofen (ADVIL) 800 MG tablet, Take 1 tablet (800 mg total) by mouth every 6 (six) hours., Disp: 45 tablet, Rfl: 1 .  ketoconazole (NIZORAL) 2 % shampoo, Apply 1 application topically 2 (two) times a week., Disp: , Rfl:  .  levothyroxine (SYNTHROID) 100 MCG tablet, TAKE 1 TABLET BY MOUTH EVERY DAY, Disp: 90 tablet, Rfl: 0 .  metFORMIN (GLUCOPHAGE-XR) 500 MG 24 hr tablet, Take 500 mg PO qam with breakfast, and take 1000 mg PO qom with dinner or at bedtime (Patient taking differently: Take 1,500 mg by mouth. Take 500 mg PO qam with breakfast, and take 1000 mg PO qom with dinner or at bedtime), Disp: 270 tablet, Rfl: 0 .  pantoprazole (PROTONIX) 40 MG tablet, Take 1 tablet (40 mg total)  by mouth every morning., Disp: 90 tablet, Rfl: 1 .  triamcinolone (NASACORT) 55 MCG/ACT AERO nasal inhaler, Place 2 sprays into the nose daily., Disp: , Rfl: 12 .  vortioxetine HBr (TRINTELLIX) 20 MG TABS tablet, Take 1 tablet (20 mg total) by mouth daily., Disp: 90 tablet, Rfl: 3  Allergies  Allergen Reactions  . Peach [Prunus Persica] Anaphylaxis and Nausea And Vomiting  . Tree Extract Itching    Sneezing and itchy eyes [place]    Chart Review Today: I personally reviewed active problem list, medication list, allergies, family history, social history, health maintenance, notes from last encounter, lab results, imaging with the patient/caregiver today.   Review of Systems  10 Systems reviewed and are negative for acute change except as noted in the HPI.     Objective:    Vitals:   10/02/19 1451  BP: 126/84  Pulse: 100  Resp: 14  Temp: 98.6 F (37 C)  SpO2: 97%  Weight: 252 lb 4.8 oz (114.4 kg)  Height: 5\' 4"  (1.626 m)    Body mass index is 43.31 kg/m.  Physical Exam Vitals and nursing note reviewed.  Constitutional:      General: She is not in acute distress.    Appearance: Normal appearance. She is well-developed. She is obese. She is not  ill-appearing, toxic-appearing or diaphoretic.     Interventions: Face mask in place.  HENT:     Head: Normocephalic and atraumatic.     Right Ear: External ear normal.     Left Ear: External ear normal.  Eyes:     General: Lids are normal. No scleral icterus.       Right eye: No discharge.        Left eye: No discharge.     Conjunctiva/sclera: Conjunctivae normal.  Neck:     Trachea: Phonation normal. No tracheal deviation.  Cardiovascular:     Rate and Rhythm: Normal rate and regular rhythm.     Pulses: Normal pulses.          Radial pulses are 2+ on the right side and 2+ on the left side.       Posterior tibial pulses are 2+ on the right side and 2+ on the left side.     Heart sounds: Normal heart sounds. No murmur. No friction rub. No gallop.   Pulmonary:     Effort: Pulmonary effort is normal. No respiratory distress.     Breath sounds: Normal breath sounds. No stridor. No wheezing, rhonchi or rales.  Chest:     Chest wall: No tenderness.  Abdominal:     General: Bowel sounds are normal. There is no distension.     Palpations: Abdomen is soft.     Tenderness: There is no abdominal tenderness. There is no guarding or rebound.  Musculoskeletal:        General: No deformity. Normal range of motion.     Cervical back: Normal range of motion and neck supple.     Right lower leg: No edema.     Left lower leg: No edema.  Lymphadenopathy:     Cervical: No cervical adenopathy.  Skin:    General: Skin is warm and dry.     Capillary Refill: Capillary refill takes less than 2 seconds.     Coloration: Skin is not jaundiced or pale.     Findings: No rash.  Neurological:     Mental Status: She is alert and oriented to person, place, and time.  Motor: No abnormal muscle tone.     Gait: Gait normal.  Psychiatric:        Speech: Speech normal.        Behavior: Behavior normal.      PHQ2/9: Depression screen [facility] 2/9 10/02/2019 04/10/2019  Decreased Interest 0 0  Down,  Depressed, Hopeless 1 2  PHQ - 2 Score 1 2  Altered sleeping 0 0  Tired, decreased energy 2 3  Change in appetite 0 0  Feeling bad or failure about yourself  0 0  Trouble concentrating 0 3  Moving slowly or fidgety/restless 0 0  Suicidal thoughts 0 0  PHQ-9 Score 3 8  Difficult doing work/chores Not difficult at all Somewhat difficult    phq 9 is neg, reviewed  Fall Risk: Fall Risk  10/02/2019 04/10/2019  Falls in the past year? 0 0  Number falls in past yr: 0 0  Injury with Fall? 0 0    Functional Status Survey: Is the patient deaf or have difficulty hearing?: No Does the patient have difficulty seeing, even when wearing glasses/contacts?: No Does the patient have difficulty concentrating, remembering, or making decisions?: No Does the patient have difficulty walking or climbing stairs?: No Does the patient have difficulty dressing or bathing?: No Does the patient have difficulty doing errands alone such as visiting a doctor's office or shopping?: No   Assessment & Plan:     ICD-10-CM   1. Hypothyroidism, unspecified type  E03.9 TSH   Patient has been taking with her Protonix we reviewed correct administration, will recheck labs and then send in refills  2. Essential hypertension  I10 COMPLETE METABOLIC PANEL WITH GFR   Blood pressure well controlled and stable with diet and lifestyle efforts  3. Prediabetes  R73.03 Hemoglobin A1c    COMPLETE METABOLIC PANEL WITH GFR    metFORMIN (GLUCOPHAGE-XR) 500 MG 24 hr tablet   She is tolerating Metformin 1500 mg but taking in 1 dose in the morning encouraged her to divide doses and take with food  4. Anemia, unspecified type  D64.9 Iron, TIBC and Ferritin Panel    CBC (INCLUDES DIFF/PLT) WITH PATHOLOGIST REVIEW  5. Anemia associated with acute blood loss  D62 Iron, TIBC and Ferritin Panel    CBC (INCLUDES DIFF/PLT) WITH PATHOLOGIST REVIEW  6. Microcytosis  R71.8 Iron, TIBC and Ferritin Panel    CBC (INCLUDES DIFF/PLT) WITH  PATHOLOGIST REVIEW   Microcytosis with history of bleeding disorder had complications in surgery where there was a lot of bleeding as well we will recheck CBC today  7. Major depressive disorder with current active episode, unspecified depression episode severity, unspecified whether recurrent  F32.9    Continues to be well controlled with Trintellix  8. S/P total hysterectomy and bilateral salpingo-oophorectomy  Z90.710    Z90.79    Z90.722   9. Seborrheic dermatitis of scalp  L21.9 ketoconazole (NIZORAL) 2 % shampoo   She requests refill of ketoconazole shampoo  10. Obesity, Class III, BMI 40-49.9 (morbid obesity) (HCC)  E66.01 metFORMIN (GLUCOPHAGE-XR) 500 MG 24 hr tablet   Some decrease in her weight encouraged healthy diet and exercise when able she is continuing to try Metformin for prediabetes  11. Hyperlipidemia, unspecified hyperlipidemia type  E78.5   12. Prothrombin gene mutation [facility])  [phone]    Patient has significant family medical history, has Lynch syndrome, prothrombin gene deficiency with bleeding disorder PCOS -her GYN has helped her be established with multiple Lynch syndrome specialist including GI, she  does had hysterectomy and bilateral oophorectomy she is not having any symptoms that are bothersome for estrogen deficiency and with her diagnoses estrogen replacement therapy is contraindicated she overall feels good.  We discussed long-term bone health by supplementing with vitamin D and calcium doing weightbearing exercises I gave her a handout on osteoporosis prevention.  As a relatively new patient I am still updating chart with her diagnoses and reviewing past visits with specialist through care everywhere.    Patient has not supplemented iron before but recent labs show microcytic anemia with recent blood loss we will recheck labs due pathology smear and add iron panel.  Today she came in for December evaluation to check up after surgery with multiple complications,  she only had her initial appointment and then today and has not come for any other routine office visits to address chronic conditions, labs were done today and patient was encouraged to come back for a physical Prothrombin gene mutation (CMS-HCC) (Primary Dx);  Portal vein thrombosis;  Class 3 severe obesity without serious comorbidity with body mass index (BMI) of 40.0 to 44.9 in adult, unspecified obesity type (CMS-HCC);  Abnormal uterine bleeding (AUB);  Family history of Lynch syndrome;  Family history of malignant hyperthermia;  Family history of malignant neoplasm of ovary in first degree relative       Danelle Berry, PA-C 10/02/19 3:07 PM

## 2019-10-02 NOTE — Patient Instructions (Addendum)
Start supplementing Vit D 1000 IU a day and Calcium 1200 mg daily  Can find in Combo   Preventing Osteoporosis, Adult Osteoporosis is a condition that causes the bones to lose density. This means that the bones become thinner, and the normal spaces in bone tissue become larger. Low bone density can make the bones weak and cause them to break more easily. Osteoporosis cannot always be prevented, but you can take steps to lower your risk of developing this condition. How can this condition affect me? If you develop osteoporosis, you will be more likely to break bones in your wrist, spine, or hip. Even a minor accident or injury can be enough to break weak bones. The bones will also be slower to heal. Osteoporosis can cause other problems as well, such as a stooped posture or trouble with movement. Osteoporosis can occur with aging. As you get older, you may lose bone tissue more quickly, or it may be replaced more slowly. Osteoporosis is more likely to develop if you have poor nutrition or do not get enough calcium or vitamin D. Other lifestyle factors can also play a role. By eating a well-balanced diet and making lifestyle changes, you can help keep your bones strong and healthy, lowering your chances of developing osteoporosis. What can increase my risk? The following factors may make you more likely to develop osteoporosis:  Having a family history of the condition.  Having poor nutrition or not getting enough calcium or vitamin D.  Using certain medicines, such as steroid medicines or antiseizure medicines.  Being any of the following: ? 90 years of age or older. ? Female. ? A woman who has gone through menopause (is postmenopausal). ? White (Caucasian) or of Asian descent.  Smoking or having a history of smoking.  Not being physically active (being sedentary).  Having a small body frame. What actions can I take to prevent this?  Get enough calcium   Make sure you get enough  calcium every day. Calcium is the most important mineral for bone health. Most people can get enough calcium from their diet, but supplements may be recommended for people who are at risk for osteoporosis. Follow these guidelines: ? If you are age 28 or younger, aim to get 1,000 mg of calcium every day. ? If you are older than age 90, aim to get 1,200 mg of calcium every day.  Good sources of calcium include: ? Dairy products, such as low-fat or nonfat milk, cheese, and yogurt. ? Dark green leafy vegetables, such as bok choy and broccoli. ? Foods that have had calcium added to them (calcium-fortified foods), such as orange juice, cereal, bread, soy beverages, and tofu products. ? Nuts, such as almonds.  Check nutrition labels to see how much calcium is in a food or drink. Get enough vitamin D  Try to get enough vitamin D every day. Vitamin D is the most essential vitamin for bone health. It helps the body absorb calcium. Follow these guidelines for how much vitamin D to get from food: ? If you are age 45 or younger, aim to get at least 600 international units (IU) every day. Your health care provider may suggest more. ? If you are older than age 13, aim to get at least 800 international units every day. Your health care provider may suggest more.  Good sources of vitamin D in your diet include: ? Egg yolks. ? Oily fish, such as salmon, sardines, and tuna. ? Milk and cereal fortified  with vitamin D.  Your body also makes vitamin D when you are out in the sun. Exposing the bare skin on your face, arms, legs, or back to the sun for no more than 30 minutes a day, 2 times a week is more than enough. Beyond that, make sure you use sunblock to protect your skin from sunburn, which increases your risk for skin cancer. Exercise  Stay active and get exercise every day.  Ask your health care provider what types of exercise are best for you. Weight-bearing and strength-building activities are  important for building and maintaining healthy bones. Some examples of these types of activities include: ? Walking and hiking. ? Jogging and running. ? Dancing. ? Gym exercises. ? Lifting weights. ? Tennis and racquetball. ? Climbing stairs. ? Aerobics. Make other lifestyle changes  Do not use any products that contain nicotine or tobacco, such as cigarettes, e-cigarettes, and chewing tobacco. If you need help quitting, ask your health care provider.  Lose weight if you are overweight.  If you drink alcohol: ? Limit how much you use to:  0-1 drink a day for nonpregnant women.  0-2 drinks a day for men. ? Be aware of how much alcohol is in your drink. In the [place] one drink equals one 12 oz bottle of beer (355 mL), one 5 oz glass of wine (148 mL), or one 1 oz glass of hard liquor (44 mL). Where to find support If you need help making changes to prevent osteoporosis, talk with your health care provider. You can ask for a referral to a diet and nutrition specialist (dietitian) and a physical therapist. Where to find more information Learn more about osteoporosis from:  NIH Osteoporosis and Related [place]: www.bones.SouthExposed.es  [place] Office on [employer] Health: [place].tn  [place]: EquipmentWeekly.com.ee Summary  Osteoporosis is a condition that causes weak bones that are more likely to break.  Eat a healthy diet, making sure you get enough calcium and vitamin D, and stay active by getting regular exercise to help prevent osteoporosis.  Other ways to reduce your risk of osteoporosis include maintaining a healthy weight and avoiding alcohol and products that contain nicotine or tobacco. This information is not intended to replace advice given to you by your health care provider. Make sure you discuss any questions you have with your health care provider. Document Revised: 01/27/2019 Document Reviewed: 01/27/2019 Elsevier Patient  Education  [place].

## 2019-10-03 LAB — COMPLETE METABOLIC PANEL WITH GFR
AG Ratio: 1.5 (calc) (ref 1.0–2.5)
ALT: 42 U/L — ABNORMAL HIGH (ref 6–29)
AST: 27 U/L (ref 10–30)
Albumin: 4.4 g/dL (ref 3.6–5.1)
Alkaline phosphatase (APISO): 89 U/L (ref 31–125)
BUN: 15 mg/dL (ref 7–25)
CO2: 27 mmol/L (ref 20–32)
Calcium: 9.8 mg/dL (ref 8.6–10.2)
Chloride: 103 mmol/L (ref 98–110)
Creat: 0.95 mg/dL (ref 0.50–1.10)
GFR, Est African American: 85 mL/min/{1.73_m2} (ref >=60)
GFR, Est Non African American: 73 mL/min/{1.73_m2} (ref >=60)
Globulin: 2.9 g/dL (calc) (ref 1.9–3.7)
Glucose, Bld: 110 mg/dL — ABNORMAL HIGH (ref 65–99)
Potassium: 4.3 mmol/L (ref 3.5–5.3)
Sodium: 140 mmol/L (ref 135–146)
Total Bilirubin: 0.4 mg/dL (ref 0.2–1.2)
Total Protein: 7.3 g/dL (ref 6.1–8.1)

## 2019-10-03 LAB — HEMOGLOBIN A1C
Hgb A1c MFr Bld: 5.8 % of total Hgb — ABNORMAL HIGH (ref <5.7)
Mean Plasma Glucose: 120 (calc)
eAG (mmol/L): 6.6 (calc)

## 2019-10-03 LAB — IRON,TIBC AND FERRITIN PANEL
%SAT: 6 % (calc) — ABNORMAL LOW (ref 16–45)
Ferritin: 16 ng/mL (ref 16–232)
Iron: 29 ug/dL — ABNORMAL LOW (ref 40–190)
TIBC: 463 mcg/dL (calc) — ABNORMAL HIGH (ref 250–450)

## 2019-10-03 LAB — CBC (INCLUDES DIFF/PLT) WITH PATHOLOGIST REVIEW
Absolute Monocytes: 544 cells/uL (ref 200–950)
Basophils Absolute: 60 cells/uL (ref 0–200)
Basophils Relative: 0.7 %
Eosinophils Absolute: 221 cells/uL (ref 15–500)
Eosinophils Relative: 2.6 %
HCT: 35.1 % (ref 35.0–45.0)
Hemoglobin: 10.7 g/dL — ABNORMAL LOW (ref 11.7–15.5)
Lymphs Abs: 2440 cells/uL (ref 850–3900)
MCH: 21.4 pg — ABNORMAL LOW (ref 27.0–33.0)
MCHC: 30.5 g/dL — ABNORMAL LOW (ref 32.0–36.0)
MCV: 70.1 fL — ABNORMAL LOW (ref 80.0–100.0)
MPV: 11 fL (ref 7.5–12.5)
Monocytes Relative: 6.4 %
Neutro Abs: 5236 cells/uL (ref 1500–7800)
Neutrophils Relative %: 61.6 %
Platelets: 274 10*3/uL (ref 140–400)
RBC: 5.01 10*6/uL (ref 3.80–5.10)
RDW: 15.8 % — ABNORMAL HIGH (ref 11.0–15.0)
Total Lymphocyte: 28.7 %
WBC: 8.5 10*3/uL (ref 3.8–10.8)

## 2019-10-03 LAB — TSH: TSH: 0.42 mIU/L

## 2019-10-04 ENCOUNTER — Other Ambulatory Visit: Payer: Self-pay | Admitting: Family Medicine

## 2019-10-04 DIAGNOSIS — R718 Other abnormality of red blood cells: Secondary | ICD-10-CM | POA: Insufficient documentation

## 2019-10-04 DIAGNOSIS — E039 Hypothyroidism, unspecified: Secondary | ICD-10-CM | POA: Insufficient documentation

## 2019-10-04 DIAGNOSIS — D62 Acute posthemorrhagic anemia: Secondary | ICD-10-CM | POA: Insufficient documentation

## 2019-10-04 DIAGNOSIS — D6852 Prothrombin gene mutation: Secondary | ICD-10-CM | POA: Insufficient documentation

## 2019-10-04 DIAGNOSIS — L219 Seborrheic dermatitis, unspecified: Secondary | ICD-10-CM | POA: Insufficient documentation

## 2019-10-04 DIAGNOSIS — R7303 Prediabetes: Secondary | ICD-10-CM

## 2019-10-04 DIAGNOSIS — E66812 Obesity, class 2: Secondary | ICD-10-CM | POA: Insufficient documentation

## 2019-10-04 HISTORY — DX: Other abnormality of red blood cells: R71.8

## 2019-10-04 MED ORDER — LEVOTHYROXINE SODIUM 100 MCG PO TABS: 100.0000 ug | ORAL_TABLET | Freq: Every day | ORAL | 3 refills | Status: DC

## 2019-10-30 ENCOUNTER — Encounter: Payer: Self-pay | Admitting: Family Medicine

## 2019-11-10 ENCOUNTER — Ambulatory Visit: Payer: BC Managed Care – PPO | Admitting: Family Medicine

## 2019-11-10 ENCOUNTER — Encounter: Payer: Self-pay | Admitting: Family Medicine

## 2019-11-10 ENCOUNTER — Other Ambulatory Visit: Payer: Self-pay

## 2019-11-10 VITALS — BP 126/78 | HR 98 | Temp 97.8°F | Resp 14 | Ht 64.0 in | Wt 253.4 lb

## 2019-11-10 DIAGNOSIS — Z6841 Body Mass Index (BMI) 40.0 and over, adult: Secondary | ICD-10-CM

## 2019-11-10 DIAGNOSIS — E119 Type 2 diabetes mellitus without complications: Secondary | ICD-10-CM

## 2019-11-10 DIAGNOSIS — E611 Iron deficiency: Secondary | ICD-10-CM

## 2019-11-10 DIAGNOSIS — R718 Other abnormality of red blood cells: Secondary | ICD-10-CM | POA: Diagnosis not present

## 2019-11-10 DIAGNOSIS — D509 Iron deficiency anemia, unspecified: Secondary | ICD-10-CM

## 2019-11-10 MED ORDER — PHENTERMINE HCL 37.5 MG PO TABS: 37.5000 mg | ORAL_TABLET | Freq: Every day | ORAL | 0 refills | Status: DC

## 2019-11-10 MED ORDER — SAXENDA 18 MG/3ML ~~LOC~~ SOPN: 0.6000 mg | PEN_INJECTOR | SUBCUTANEOUS | 0 refills | Status: DC

## 2019-11-10 NOTE — Progress Notes (Signed)
Patient ID: Joanna Scott, female    DOB: 06-27-1976, 44 y.o.   MRN: [id number]  PCP: Delsa Grana, PA-C  Chief Complaint  Patient presents with  . Obesity    weight management    Subjective:   Joanna Scott is a 44 y.o. female, presents to clinic with CC of the following:  HPI   Here for obesity and weight management She was very lean most of her life and in mid 20's started gaining weight steadily and has never been able to lose weight  She reports eating very small portions, she has done nutritional/dietician counseling in the past and "graduated" She is here to discuss meds for weight loss.  She has hx of diabetes/prediabetes, last A1C was 5.8, elevated cholesterol very high with last labs  Lab Results  Component Value Date   CHOL 215 (H) 11/09/2018   HDL 33 (L) 11/09/2018   LDLCALC 142 (H) 11/09/2018   TRIG 199 (H) 11/09/2018   CHOLHDL 6.5 (H) 11/09/2018  BMI is 43.5 She has continued to work on diet and trying to be active as much as able following surgery, multiple complications, blood loss and anemia.  She has had some success with weight loss of about 13 pounds over the past several months with dietary efforts with exercise  Wt Readings from Last 5 Encounters:  11/10/19 253 lb 6.4 oz (114.9 kg)  10/02/19 252 lb 4.8 oz (114.4 kg)  08/08/19 266 lb (120.7 kg)  07/05/19 266 lb 8 oz (120.9 kg)  06/22/19 269 lb (122 kg)   BMI Readings from Last 5 Encounters:  11/10/19 43.50 kg/m  10/02/19 43.31 kg/m  08/08/19 45.66 kg/m  07/05/19 45.74 kg/m  06/22/19 46.17 kg/m   Pt has no family or personal history of medullary thyroid cancer, she denies any history of pancreatitis   She has also has anemia following surgery and hypothyroid  After last visit in March, she had iron deficiency anemia, H/H 10.7/35.1, Ferritin 16, %sat 6%, TIBC 463 and inro 29 She started an Iron supplement OTC has been able to do about 3 times a week with tolerable/minimal GI sx She feels like  she's slowly getting better energy.  She is not having CP, SOB, rapid heart rate and severe fatigue.  Hypothyroidism: Current Medication Regimen: 100 mcg synthroid Takes medicine separate from iron supplement and from protonix Current Symptoms: denies fatigue, weight changes, heat/cold intolerance, bowel/skin changes or CVS symptoms Most recent results are below; we will not be repeating labs today. Lab Results  Component Value Date   TSH 0.42 10/02/2019        Patient Active Problem List   Diagnosis Date Noted  . Hypothyroidism 10/04/2019  . Anemia associated with acute blood loss 10/04/2019  . Microcytosis 10/04/2019  . Seborrheic dermatitis of scalp 10/04/2019  . Obesity, Class III, BMI 40-49.9 (morbid obesity) ([place]) 10/04/2019  . Prothrombin gene mutation ([place]) 10/04/2019  . S/P total hysterectomy and bilateral salpingo-oophorectomy 06/26/2019  . Hyperlipidemia 04/10/2019  . Essential hypertension 04/10/2019  . Prediabetes 04/10/2019  . Major depressive disorder with current active episode 04/10/2019  . Anemia 04/10/2019  . Gastroesophageal reflux disease 04/10/2019  . Allergic rhinitis 04/10/2019  . Bilateral polycystic ovarian syndrome 10/20/2012      Current Outpatient Medications:  .  cetirizine (ZYRTEC) 10 MG tablet, Take 10 mg by mouth daily. , Disp: , Rfl:  .  EPINEPHrine 0.3 mg/0.3 mL IJ SOAJ injection, Inject 0.3 mLs (0.3 mg total) into the muscle as  needed for anaphylaxis., Disp: 1 each, Rfl: 0 .  ibuprofen (ADVIL) 800 MG tablet, Take 1 tablet (800 mg total) by mouth every 6 (six) hours., Disp: 45 tablet, Rfl: 1 .  ketoconazole (NIZORAL) 2 % shampoo, Apply 1 application topically 2 (two) times a week., Disp: 120 mL, Rfl: 2 .  levothyroxine (SYNTHROID) 100 MCG tablet, Take 1 tablet (100 mcg total) by mouth daily. Take on empty stomach at least 30 minutes before breakfast, Disp: 90 tablet, Rfl: 3 .  [START ON 11/13/2019] metFORMIN (GLUCOPHAGE-XR) 500 MG 24 hr  tablet, Take 2 tablets (1,000 mg total) by mouth 2 (two) times daily with a meal., Disp: 360 tablet, Rfl: 3 .  pantoprazole (PROTONIX) 40 MG tablet, Take 1 tablet (40 mg total) by mouth every morning., Disp: 90 tablet, Rfl: 1 .  triamcinolone (NASACORT) 55 MCG/ACT AERO nasal inhaler, Place 2 sprays into the nose daily., Disp: , Rfl: 12 .  vortioxetine HBr (TRINTELLIX) 20 MG TABS tablet, Take 1 tablet (20 mg total) by mouth daily., Disp: 90 tablet, Rfl: 3   Allergies  Allergen Reactions  . Peach [Prunus Persica] Anaphylaxis and Nausea And Vomiting  . Tree Extract Itching    Sneezing and itchy eyes [place]     Family History  Problem Relation Age of Onset  . Malignant hyperthermia Cousin   . Deep vein thrombosis Maternal Grandmother   . Hypertension Mother   . Depression Mother   . COPD Mother   . Ovarian cancer Sister   . Stroke Maternal Grandfather   . Heart attack Maternal Grandfather   . Malignant hyperthermia Other   . Encopresis Son      Social History   Socioeconomic History  . Marital status: Married    Spouse name: Chrissie NoaWilliam  . Number of children: 2  . Years of education: 6112  . Highest education level: Bachelor's degree (e.g., BA, AB, BS)  Occupational History  . Occupation: [employer]  Tobacco Use  . Smoking status: Never Smoker  . Smokeless tobacco: Never Used  Substance and Sexual Activity  . Alcohol use: Yes  . Drug use: No  . Sexual activity: Yes    Birth control/protection: None  Other Topics Concern  . Not on file  Social History Narrative  . Not on file   Social Determinants of Health   Financial Resource Strain: Low Risk   . Difficulty of Paying Living Expenses: Not hard at all  Food Insecurity: No Food Insecurity  . Worried About Programme researcher, broadcasting/film/videounning Out of Food in the Last Year: Never true  . Ran Out of Food in the Last Year: Never true  Transportation Needs: No Transportation Needs  . Lack of Transportation (Medical): No  . Lack of Transportation  (Non-Medical): No  Physical Activity: Insufficiently Active  . Days of Exercise per Week: 4 days  . Minutes of Exercise per Session: 20 min  Stress: No Stress Concern Present  . Feeling of Stress : Not at all  Social Connections: Somewhat Isolated  . Frequency of Communication with Friends and Family: Three times a week  . Frequency of Social Gatherings with Friends and Family: Not on file  . Attends Religious Services: Never  . Active Member of Clubs or Organizations: No  . Attends BankerClub or Organization Meetings: Never  . Marital Status: Married  Catering managerntimate Partner Violence: Not At Risk  . Fear of Current or Ex-Partner: No  . Emotionally Abused: No  . Physically Abused: No  . Sexually Abused: No  Chart Review Today: I personally reviewed active problem list, medication list, allergies, family history, social history, health maintenance, notes from last encounter, lab results, imaging with the patient/caregiver today.  Review of Systems 10 Systems reviewed and are negative for acute change except as noted in the HPI.     Objective:   Vitals:   11/10/19 1504  BP: 126/78  Pulse: 98  Resp: 14  Temp: 97.8 F (36.6 C)  SpO2: 98%  Weight: 253 lb 6.4 oz (114.9 kg)  Height: 5\' 4"  (1.626 m)    Body mass index is 43.5 kg/m.  Physical Exam Vitals and nursing note reviewed.  Constitutional:      General: She is not in acute distress.    Appearance: Normal appearance. She is obese. She is not ill-appearing, toxic-appearing or diaphoretic.  HENT:     Head: Normocephalic and atraumatic.     Right Ear: External ear normal.     Left Ear: External ear normal.  Eyes:     General:        Right eye: No discharge.        Left eye: No discharge.     Conjunctiva/sclera: Conjunctivae normal.  Cardiovascular:     Rate and Rhythm: Normal rate and regular rhythm.     Pulses: Normal pulses.     Heart sounds: Normal heart sounds. No murmur. No friction rub. No gallop.   Pulmonary:      Effort: Pulmonary effort is normal. No respiratory distress.     Breath sounds: Normal breath sounds. No stridor. No wheezing or rales.  Abdominal:     General: Bowel sounds are normal. There is no distension.     Palpations: Abdomen is soft.     Tenderness: There is no abdominal tenderness. There is no guarding.  Musculoskeletal:     Cervical back: Normal range of motion.  Skin:    Coloration: Skin is not pale.     Findings: No lesion or rash.  Neurological:     Mental Status: She is alert.     Gait: Gait normal.  Psychiatric:        Mood and Affect: Mood normal.        Behavior: Behavior normal.      Results for orders placed or performed in visit on 10/02/19  TSH  Result Value Ref Range   TSH 0.42 mIU/L  Hemoglobin A1c  Result Value Ref Range   Hgb A1c MFr Bld 5.8 (H) <5.7 % of total Hgb   Mean Plasma Glucose 120 (calc)   eAG (mmol/L) 6.6 (calc)  COMPLETE METABOLIC PANEL WITH GFR  Result Value Ref Range   Glucose, Bld 110 (H) 65 - 99 mg/dL   BUN 15 7 - 25 mg/dL   Creat 10/04/19 7.51 - 0.25 mg/dL   GFR, Est Non African American 73 > OR = 60 mL/min/1.81m2   GFR, Est African American 85 > OR = 60 mL/min/1.67m2   BUN/Creatinine Ratio NOT APPLICABLE 6 - 22 (calc)   Sodium 140 135 - 146 mmol/L   Potassium 4.3 3.5 - 5.3 mmol/L   Chloride 103 98 - 110 mmol/L   CO2 27 20 - 32 mmol/L   Calcium 9.8 8.6 - 10.2 mg/dL   Total Protein 7.3 6.1 - 8.1 g/dL   Albumin 4.4 3.6 - 5.1 g/dL   Globulin 2.9 1.9 - 3.7 g/dL (calc)   AG Ratio 1.5 1.0 - 2.5 (calc)   Total Bilirubin 0.4 0.2 - 1.2 mg/dL  Alkaline phosphatase (APISO) 89 31 - 125 U/L   AST 27 10 - 30 U/L   ALT 42 (H) 6 - 29 U/L  Iron, TIBC and Ferritin Panel  Result Value Ref Range   Iron 29 (L) 40 - 190 mcg/dL   TIBC 295 (H) 188 - 416 mcg/dL (calc)   %SAT 6 (L) 16 - 45 % (calc)   Ferritin 16 16 - 232 ng/mL  CBC (INCLUDES DIFF/PLT) WITH PATHOLOGIST REVIEW  Result Value Ref Range   WBC 8.5 3.8 - 10.8 Thousand/uL   RBC 5.01  3.80 - 5.10 Million/uL   Hemoglobin 10.7 (L) 11.7 - 15.5 g/dL   HCT 60.6 30.1 - 60.1 %   MCV 70.1 (L) 80.0 - 100.0 fL   MCH 21.4 (L) 27.0 - 33.0 pg   MCHC 30.5 (L) 32.0 - 36.0 g/dL   RDW 09.3 (H) 23.5 - 57.3 %   Platelets 274 140 - 400 Thousand/uL   MPV 11.0 7.5 - 12.5 fL   Neutro Abs 5,236 1,500 - 7,800 cells/uL   Lymphs Abs 2,440 850 - 3,900 cells/uL   Absolute Monocytes 544 200 - 950 cells/uL   Eosinophils Absolute 221 15 - 500 cells/uL   Basophils Absolute 60 0 - 200 cells/uL   Neutrophils Relative % 61.6 %   Total Lymphocyte 28.7 %   Monocytes Relative 6.4 %   Eosinophils Relative 2.6 %   Basophils Relative 0.7 %   Comment          Assessment & Plan:    1. Class 3 severe obesity with body mass index (BMI) of 40.0 to 44.9 in adult, unspecified obesity type, unspecified whether serious comorbidity present [facility]) Patient is morbidly obese with multiple comorbidities including diabetes, hyperlipidemia, she suffered from insulin resistance, PCOS, infertility she does have increased weight circumference.  She has previously done nutritional education with dietitians, she states overall she does not eat large portions, we discussed the difference between phentermine and Saxenda, did prescribe both to see if she can get [place] approved or we will need to do prior authorization we did discuss today that she will do 1 or the other.  We discussed the risks and benefits of each, with phentermine she did understand that it was a stimulant could increase her heart rate blood pressure, both of which are normal today.  She has no concerning or significant family history of cardiac illness, we would watch her heart rate and her blood pressure carefully.  She was encouraged to monitor her calorie intake and nutritional intake, write down what she is actually consuming, increase exercise if she is able to tolerate it with her anemia.  We will do a 1 month follow-up - phentermine (ADIPEX-P) 37.5 MG  tablet; Take 1 tablet (37.5 mg total) by mouth daily before breakfast.  Dispense: 30 tablet; Refill: 0 - Liraglutide -Weight Management (SAXENDA) 18 MG/3ML SOPN; Inject 0.1 mLs (0.6 mg total) into the skin once a week.  Dispense: 9 mL; Refill: 0  2. Type 2 diabetes mellitus without complication, without long-term current use of insulin (HCC) Patient is on Metformin can add Saxenda for diabetes and for weight loss.  Last A1c was well controlled, discussed with the patient how Saxenda works, side effects and dosing. - Liraglutide -Weight Management (SAXENDA) 18 MG/3ML SOPN; Inject 0.1 mLs (0.6 mg total) into the skin once a week.  Dispense: 9 mL; Refill: 0  3. Iron deficiency anemia, unspecified iron deficiency anemia type Patient has been tolerating 3  doses of iron weekly, encouraged her to increase if able, we will see if her H/H and iron panel have improved over the last 6 weeks or so  - CBC with Differential/Platelet - Iron, TIBC and Ferritin Panel       Danelle Berry, PA-C 11/10/19 3:03 PM

## 2019-11-11 LAB — IRON,TIBC AND FERRITIN PANEL
%SAT: 6 % (calc) — ABNORMAL LOW (ref 16–45)
Ferritin: 17 ng/mL (ref 16–232)
Iron: 29 ug/dL — ABNORMAL LOW (ref 40–190)
TIBC: 461 mcg/dL (calc) — ABNORMAL HIGH (ref 250–450)

## 2019-11-11 LAB — CBC WITH DIFFERENTIAL/PLATELET
Absolute Monocytes: 528 cells/uL (ref 200–950)
Basophils Absolute: 44 cells/uL (ref 0–200)
Basophils Relative: 0.5 %
Eosinophils Absolute: 211 cells/uL (ref 15–500)
Eosinophils Relative: 2.4 %
HCT: 35.3 % (ref 35.0–45.0)
Hemoglobin: 10.9 g/dL — ABNORMAL LOW (ref 11.7–15.5)
Lymphs Abs: 2138 cells/uL (ref 850–3900)
MCH: 21.9 pg — ABNORMAL LOW (ref 27.0–33.0)
MCHC: 30.9 g/dL — ABNORMAL LOW (ref 32.0–36.0)
MCV: 71 fL — ABNORMAL LOW (ref 80.0–100.0)
MPV: 10.8 fL (ref 7.5–12.5)
Monocytes Relative: 6 %
Neutro Abs: 5878 cells/uL (ref 1500–7800)
Neutrophils Relative %: 66.8 %
Platelets: 274 10*3/uL (ref 140–400)
RBC: 4.97 10*6/uL (ref 3.80–5.10)
RDW: 16.7 % — ABNORMAL HIGH (ref 11.0–15.0)
Total Lymphocyte: 24.3 %
WBC: 8.8 10*3/uL (ref 3.8–10.8)

## 2019-11-13 ENCOUNTER — Encounter: Payer: Self-pay | Admitting: Family Medicine

## 2019-11-15 ENCOUNTER — Other Ambulatory Visit: Payer: Self-pay | Admitting: Family Medicine

## 2019-11-15 ENCOUNTER — Encounter: Payer: Self-pay | Admitting: Family Medicine

## 2019-11-15 NOTE — Progress Notes (Signed)
Pt was able to get phentermine from pharmacy and started that today for obesity and for weight loss. Will not proceed with Saxenda at this time - removed from med list.  Pt has f/up in one month.  Danelle Berry, PA-C

## 2019-11-29 ENCOUNTER — Encounter: Payer: Self-pay | Admitting: Family Medicine

## 2019-11-29 ENCOUNTER — Ambulatory Visit: Payer: BC Managed Care – PPO | Admitting: Family Medicine

## 2019-11-29 ENCOUNTER — Other Ambulatory Visit: Payer: Self-pay

## 2019-11-29 VITALS — BP 138/78 | HR 95 | Temp 97.8°F | Resp 14 | Ht 64.0 in | Wt 251.1 lb

## 2019-11-29 DIAGNOSIS — D509 Iron deficiency anemia, unspecified: Secondary | ICD-10-CM

## 2019-11-29 DIAGNOSIS — Z79899 Other long term (current) drug therapy: Secondary | ICD-10-CM

## 2019-11-29 DIAGNOSIS — D649 Anemia, unspecified: Secondary | ICD-10-CM | POA: Diagnosis not present

## 2019-11-29 DIAGNOSIS — D6852 Prothrombin gene mutation: Secondary | ICD-10-CM

## 2019-11-29 DIAGNOSIS — Z6841 Body Mass Index (BMI) 40.0 and over, adult: Secondary | ICD-10-CM

## 2019-11-29 MED ORDER — PHENTERMINE HCL 37.5 MG PO TABS: 18.7500 mg | ORAL_TABLET | Freq: Every day | ORAL | 0 refills | Status: DC

## 2019-11-29 NOTE — Progress Notes (Signed)
Patient ID: Joanna Scott, female    DOB: 14-Oct-1975, 44 y.o.   MRN: [id number]  PCP: Danelle Berry, PA-C  Chief Complaint  Patient presents with  . Follow-up  . Weight Check    does not like the way the phentermine makes her feel, heart racing anxious    Subjective:   Joanna Scott is a 44 y.o. female, presents to clinic with CC of the following:  HPI   F/up here on phentermine and weight management.  She has continued to eat small portions of healthy food she admits that she is still not drinking enough clear fluids.  She has been taking phentermine in the morning for about the past 3 weeks -Saxenda was also sent into the pharmacy but she decided to start with phentermine and all she knows about [facility] is that it was pending approval. She does feel that the phentermine makes her have more energy she does feel that it makes her feel slightly anxious and jittery it definitely has curbed her appetite she has even gone through most of the day without eating at all and not thinking about eating.  She is not any chest pain, hypertension, dry mouth, near syncope or any mood changes. Her weight is decreased by about 2 pounds since her last visit Wt Readings from Last 5 Encounters:  11/29/19 251 lb 1.6 oz (113.9 kg)  11/10/19 253 lb 6.4 oz (114.9 kg)  10/02/19 252 lb 4.8 oz (114.4 kg)  08/08/19 266 lb (120.7 kg)  07/05/19 266 lb 8 oz (120.9 kg)   BMI Readings from Last 5 Encounters:  11/29/19 43.10 kg/m  11/10/19 43.50 kg/m  10/02/19 43.31 kg/m  08/08/19 45.66 kg/m  07/05/19 45.74 kg/m    She is still interested in saxenda - [facility] said it was pending She is on Metformin for PCOS, insulin resistance and metabolic syndrome She has been able to tolerate Metformin 7 release 1000 mg twice daily -last A1c was about 2 months ago it was 5.8  Patient has been monitoring her anemia and iron deficiency here for the past couple months.  She has tried to take a oral iron supplement but is only  able to tolerate it about once or twice a week.  She is a strong history of anemia also a strong family history in her mother and her sister.  Her hemoglobin is near what I can see has been her baseline before at 10.9 to low 11 however she has worse microcytosis than previously and this occurred after having a complications during her surgery for total abdominal hysterectomy and bilateral salpingo-oophorectomy.  We have done her iron panel twice since she has been supplementing oral iron and has not improved much.   She has gone to hematology in this area in the past and had hematology specialist when she moved away.  They had worked up and diagnosed her prothrombin gene deficiency and coagulopathy and help manage her anticoagulants through her pregnancies.  She would like to reestablish with hematology locally -would prefer a female provider and prefer not to go back to the female provider or Dr. Orlie Dakin who she saw in the past Lab Results  Component Value Date   WBC 8.8 11/10/2019   HGB 10.9 (L) 11/10/2019   HCT 35.3 11/10/2019   MCV 71.0 (L) 11/10/2019   PLT 274 11/10/2019   Lab Results  Component Value Date   IRON 29 (L) 11/10/2019   TIBC 461 (H) 11/10/2019   FERRITIN 17 11/10/2019  We previously discussed Saxenda contraindication she has no family history of thyroid disease, pancreatitis no known family history of MEN  Genetic testing and oncology stuff was done through sister dx-patient never saw an oncologist or geneticist but her GYN does OB GYN and GYN ONC         Patient Active Problem List   Diagnosis Date Noted  . Hypothyroidism 10/04/2019  . Anemia associated with acute blood loss 10/04/2019  . Microcytosis 10/04/2019  . Seborrheic dermatitis of scalp 10/04/2019  . Obesity, Class III, BMI 40-49.9 (morbid obesity) (HCC) 10/04/2019  . Prothrombin gene mutation (HCC) 10/04/2019  . S/P total hysterectomy and bilateral salpingo-oophorectomy 06/26/2019  . Hyperlipidemia  04/10/2019  . Essential hypertension 04/10/2019  . Prediabetes 04/10/2019  . Major depressive disorder with current active episode 04/10/2019  . Anemia 04/10/2019  . Gastroesophageal reflux disease 04/10/2019  . Allergic rhinitis 04/10/2019  . Bilateral polycystic ovarian syndrome 10/20/2012      Current Outpatient Medications:  .  cetirizine (ZYRTEC) 10 MG tablet, Take 10 mg by mouth daily. , Disp: , Rfl:  .  EPINEPHrine 0.3 mg/0.3 mL IJ SOAJ injection, Inject 0.3 mLs (0.3 mg total) into the muscle as needed for anaphylaxis., Disp: 1 each, Rfl: 0 .  ketoconazole (NIZORAL) 2 % shampoo, Apply 1 application topically 2 (two) times a week., Disp: 120 mL, Rfl: 2 .  levothyroxine (SYNTHROID) 100 MCG tablet, Take 1 tablet (100 mcg total) by mouth daily. Take on empty stomach at least 30 minutes before breakfast, Disp: 90 tablet, Rfl: 3 .  metFORMIN (GLUCOPHAGE-XR) 500 MG 24 hr tablet, Take 2 tablets (1,000 mg total) by mouth 2 (two) times daily with a meal., Disp: 360 tablet, Rfl: 3 .  pantoprazole (PROTONIX) 40 MG tablet, Take 1 tablet (40 mg total) by mouth every morning., Disp: 90 tablet, Rfl: 1 .  phentermine (ADIPEX-P) 37.5 MG tablet, Take 1 tablet (37.5 mg total) by mouth daily before breakfast., Disp: 30 tablet, Rfl: 0 .  triamcinolone (NASACORT) 55 MCG/ACT AERO nasal inhaler, Place 2 sprays into the nose daily., Disp: , Rfl: 12 .  vortioxetine HBr (TRINTELLIX) 20 MG TABS tablet, Take 1 tablet (20 mg total) by mouth daily., Disp: 90 tablet, Rfl: 3   Allergies  Allergen Reactions  . Peach [Prunus Persica] Anaphylaxis and Nausea And Vomiting  . Tree Extract Itching    Sneezing and itchy eyes Charletta Cousin and Oak     Family History  Problem Relation Age of Onset  . Malignant hyperthermia Cousin   . Deep vein thrombosis Maternal Grandmother   . Hypertension Mother   . Depression Mother   . COPD Mother   . Ovarian cancer Sister   . Stroke Maternal Grandfather   . Heart attack Maternal  Grandfather   . Malignant hyperthermia Other   . Encopresis Son      Social History   Socioeconomic History  . Marital status: Married    Spouse name: Chrissie Noa  . Number of children: 2  . Years of education: 22  . Highest education level: Bachelor's degree (e.g., BA, AB, BS)  Occupational History  . Occupation: [employer]  Tobacco Use  . Smoking status: Never Smoker  . Smokeless tobacco: Never Used  Substance and Sexual Activity  . Alcohol use: Yes  . Drug use: No  . Sexual activity: Yes    Birth control/protection: None  Other Topics Concern  . Not on file  Social History Narrative  . Not on file   Social Determinants  of Health   Financial Resource Strain: Low Risk   . Difficulty of Paying Living Expenses: Not hard at all  Food Insecurity: No Food Insecurity  . Worried About Charity fundraiser in the Last Year: Never true  . Ran Out of Food in the Last Year: Never true  Transportation Needs: No Transportation Needs  . Lack of Transportation (Medical): No  . Lack of Transportation (Non-Medical): No  Physical Activity: Insufficiently Active  . Days of Exercise per Week: 4 days  . Minutes of Exercise per Session: 20 min  Stress: No Stress Concern Present  . Feeling of Stress : Not at all  Social Connections: Somewhat Isolated  . Frequency of Communication with Friends and Family: Three times a week  . Frequency of Social Gatherings with Friends and Family: Not on file  . Attends Religious Services: Never  . Active Member of Clubs or Organizations: No  . Attends Archivist Meetings: Never  . Marital Status: Married  Human resources officer Violence: Not At Risk  . Fear of Current or Ex-Partner: No  . Emotionally Abused: No  . Physically Abused: No  . Sexually Abused: No    Chart Review Today: I personally reviewed active problem list, medication list, allergies, family history, social history, health maintenance, notes from last encounter, lab results, imaging  with the patient/caregiver today.   Review of Systems 10 Systems reviewed and are negative for acute change except as noted in the HPI.     Objective:   Vitals:   11/29/19 0944  BP: 138/78  Pulse: 95  Resp: 14  Temp: 97.8 F (36.6 C)  SpO2: 98%  Weight: 251 lb 1.6 oz (113.9 kg)  Height: 5\' 4"  (1.626 m)    Body mass index is 43.1 kg/m.  Physical Exam Vitals and nursing note reviewed.  Constitutional:      General: She is not in acute distress.    Appearance: Normal appearance. She is well-developed. She is obese. She is not ill-appearing, toxic-appearing or diaphoretic.  HENT:     Head: Normocephalic and atraumatic.  Eyes:     General:        Right eye: No discharge.        Left eye: No discharge.     Conjunctiva/sclera: Conjunctivae normal.  Neck:     Trachea: No tracheal deviation.  Cardiovascular:     Rate and Rhythm: Normal rate and regular rhythm.     Pulses: Normal pulses.     Heart sounds: Normal heart sounds. No murmur. No friction rub. No gallop.   Pulmonary:     Effort: Pulmonary effort is normal. No respiratory distress.     Breath sounds: Normal breath sounds. No stridor.  Abdominal:     General: Bowel sounds are normal. There is no distension.  Skin:    Coloration: Skin is not pale.     Findings: No rash.  Neurological:     Mental Status: She is alert.     Motor: No abnormal muscle tone.     Gait: Gait normal.  Psychiatric:        Mood and Affect: Mood normal.        Behavior: Behavior normal.      Results for orders placed or performed in visit on 11/10/19  CBC with Differential/Platelet  Result Value Ref Range   WBC 8.8 3.8 - 10.8 Thousand/uL   RBC 4.97 3.80 - 5.10 Million/uL   Hemoglobin 10.9 (L) 11.7 - 15.5 g/dL  HCT 35.3 35.0 - 45.0 %   MCV 71.0 (L) 80.0 - 100.0 fL   MCH 21.9 (L) 27.0 - 33.0 pg   MCHC 30.9 (L) 32.0 - 36.0 g/dL   RDW 53.9 (H) 76.7 - 34.1 %   Platelets 274 140 - 400 Thousand/uL   MPV 10.8 7.5 - 12.5 fL   Neutro  Abs 5,878 1,500 - 7,800 cells/uL   Lymphs Abs 2,138 850 - 3,900 cells/uL   Absolute Monocytes 528 200 - 950 cells/uL   Eosinophils Absolute 211 15 - 500 cells/uL   Basophils Absolute 44 0 - 200 cells/uL   Neutrophils Relative % 66.8 %   Total Lymphocyte 24.3 %   Monocytes Relative 6.0 %   Eosinophils Relative 2.4 %   Basophils Relative 0.5 %  Iron, TIBC and Ferritin Panel  Result Value Ref Range   Iron 29 (L) 40 - 190 mcg/dL   TIBC 937 (H) 902 - 409 mcg/dL (calc)   %SAT 6 (L) 16 - 45 % (calc)   Ferritin 17 16 - 232 ng/mL        Assessment & Plan:   1. Class 3 severe obesity with body mass index (BMI) of 40.0 to 44.9 in adult, unspecified obesity type, unspecified whether serious comorbidity present [facility]) Patient has lost about 2 pounds but is concerned about the strength of phentermine.  It does make her feel anxious and she can definitely feel that it is a stimulant and an appetite suppressant.  She is able to sleep, her heart rate and blood pressure are within normal range today not concerning to me but I suggested that she decrease the dose by cutting it in half and see if she tolerates that.  Encouraged her to continue to eat small portion healthy meals and snacks, increase and push clear fluids significantly and continue to exercise as able.  She is very interested in [place] we will follow up with the pharmacy to see if her insurance will cover or if there is a prior authorization process that we will need to initiate.  I also encouraged her to go to the [place] website where she could check her own insurance coverage and look for coupons etc.  Patient has 1 more week of medications she will contact us after she reduce the dose to half tablet to see if she would like to continue this and if she tolerates this.  - phentermine (ADIPEX-P) 37.5 MG tablet; Take 0.5 tablets (18.75 mg total) by mouth daily before breakfast.  Dispense: 30 tablet; Refill: 0  2. Encounter for medication  management Monitoring of controlled substance phentermine - phentermine (ADIPEX-P) 37.5 MG tablet; Take 0.5 tablets (18.75 mg total) by mouth daily before breakfast.  Dispense: 30 tablet; Refill: 0  3. Iron deficiency anemia, unspecified iron deficiency anemia type Patient having difficulty with p.o. iron supplementation only able to do about 1-2 doses a week - Ambulatory referral to Hematology  4. Anemia, unspecified type History of anemia, unknown etiology though it does run in her family and she has history of coagulopathy, recent blood loss with a surgery with multiple complications she has newer iron deficiency associate with her anemia and newer/worse microcytosis  She is agreeable to establishing with hematology locally due to her current anemia with difficulty supplementing and her past diagnoses/coagulopathy - Ambulatory referral to Hematology  5. Prothrombin gene mutation [facility]) See #3 and 4 - Ambulatory referral to Hematology   Danelle Berry, PA-C 11/29/19 10:00 AM

## 2019-12-10 ENCOUNTER — Encounter: Payer: Self-pay | Admitting: Family Medicine

## 2019-12-12 ENCOUNTER — Other Ambulatory Visit: Payer: Self-pay | Admitting: Family Medicine

## 2019-12-12 DIAGNOSIS — E119 Type 2 diabetes mellitus without complications: Secondary | ICD-10-CM

## 2019-12-14 ENCOUNTER — Encounter: Payer: Self-pay | Admitting: Oncology

## 2019-12-14 ENCOUNTER — Inpatient Hospital Stay: Payer: BC Managed Care – PPO | Attending: Oncology | Admitting: Oncology

## 2019-12-14 ENCOUNTER — Inpatient Hospital Stay: Payer: BC Managed Care – PPO

## 2019-12-14 ENCOUNTER — Other Ambulatory Visit: Payer: Self-pay

## 2019-12-14 VITALS — BP 146/82 | HR 86 | Temp 97.0°F | Resp 16 | Wt 252.7 lb

## 2019-12-14 DIAGNOSIS — Z90722 Acquired absence of ovaries, bilateral: Secondary | ICD-10-CM

## 2019-12-14 DIAGNOSIS — Z79899 Other long term (current) drug therapy: Secondary | ICD-10-CM | POA: Diagnosis not present

## 2019-12-14 DIAGNOSIS — K219 Gastro-esophageal reflux disease without esophagitis: Secondary | ICD-10-CM

## 2019-12-14 DIAGNOSIS — D509 Iron deficiency anemia, unspecified: Secondary | ICD-10-CM

## 2019-12-14 DIAGNOSIS — E039 Hypothyroidism, unspecified: Secondary | ICD-10-CM

## 2019-12-14 DIAGNOSIS — Z9079 Acquired absence of other genital organ(s): Secondary | ICD-10-CM | POA: Diagnosis not present

## 2019-12-14 DIAGNOSIS — I10 Essential (primary) hypertension: Secondary | ICD-10-CM

## 2019-12-14 DIAGNOSIS — E785 Hyperlipidemia, unspecified: Secondary | ICD-10-CM

## 2019-12-14 DIAGNOSIS — G4730 Sleep apnea, unspecified: Secondary | ICD-10-CM | POA: Diagnosis not present

## 2019-12-14 DIAGNOSIS — E282 Polycystic ovarian syndrome: Secondary | ICD-10-CM

## 2019-12-14 DIAGNOSIS — Z86718 Personal history of other venous thrombosis and embolism: Secondary | ICD-10-CM | POA: Diagnosis not present

## 2019-12-14 DIAGNOSIS — Z98891 History of uterine scar from previous surgery: Secondary | ICD-10-CM | POA: Diagnosis not present

## 2019-12-14 DIAGNOSIS — Z90710 Acquired absence of both cervix and uterus: Secondary | ICD-10-CM | POA: Diagnosis not present

## 2019-12-14 DIAGNOSIS — Z7984 Long term (current) use of oral hypoglycemic drugs: Secondary | ICD-10-CM

## 2019-12-14 DIAGNOSIS — D6852 Prothrombin gene mutation: Secondary | ICD-10-CM

## 2019-12-14 DIAGNOSIS — F329 Major depressive disorder, single episode, unspecified: Secondary | ICD-10-CM

## 2019-12-14 NOTE — Progress Notes (Signed)
Hematology/Oncology Consult note [facility] [phone] [phone]  Patient Care Team: Danelle Berry, Cordelia Poche as PCP - General (Family Medicine)   Name of the patient: Joanna Scott  [id number]  1976-01-29    Reason for referral- anemia   Referring physician-  Danelle Berry PA  Date of visit: 12/14/19   History of presenting illness- Patient is a 44 year old female referred to [place] for iron deficiency anemia.  Her recent CBC from 11/10/2019 showed white count of 8.8, H&H of 10.9/35.3 with an MCV of 71 and a platelet count of 274.  Iron study showed a low ferritin of 17 and low iron saturation of 6% and elevated TIBC of 461.  Looking back at her prior CBCs patient has had chronic microcytic anemia and over the last 6 months her hemoglobin has been around 10.  TSH in March 2021 was normal. Patient hasd tried oral iron but unable to tolerate due to constipation. She is s/p hysterectomy. Patient sees [facility] and has had EGD and colonoscopy in feb 2021 which did not show any bleeding. Noted to have multiple gastric polyps which were consistent with fundic gland polyps. No evidence of dysplasia  Patient is heterozygous for prothrombin gene mutation. She developed portal venous thrombosis in 2016. She was on birth control at that time. She was on anticoagulation for about 8 months then stopped. She takes prophylactic anticoagulation prior to flight trips. She has not had any thromboembolic episodes since. Prior to portal venous thrombosis she has gone through C sections without any complications.   ECOG PS- 0   Review of systems- Review of Systems  Constitutional: Negative for chills, fever, malaise/fatigue and weight loss.  HENT: Negative for congestion, ear discharge and nosebleeds.   Eyes: Negative for blurred vision.  Respiratory: Negative for cough, hemoptysis, sputum production, shortness of breath and wheezing.   Cardiovascular: Negative for chest pain,  palpitations, orthopnea and claudication.  Gastrointestinal: Negative for abdominal pain, blood in stool, constipation, diarrhea, heartburn, melena, nausea and vomiting.  Genitourinary: Negative for dysuria, flank pain, frequency, hematuria and urgency.  Musculoskeletal: Negative for back pain, joint pain and myalgias.  Skin: Negative for rash.  Neurological: Negative for dizziness, tingling, focal weakness, seizures, weakness and headaches.  Endo/Heme/Allergies: Does not bruise/bleed easily.  Psychiatric/Behavioral: Negative for depression and suicidal ideas. The patient does not have insomnia.     Allergies  Allergen Reactions  . Peach [Prunus Persica] Anaphylaxis and Nausea And Vomiting  . Tree Extract Itching    Sneezing and itchy eyes Charletta Cousin and [facility]    Patient Active Problem List   Diagnosis Date Noted  . Hypothyroidism 10/04/2019  . Anemia associated with acute blood loss 10/04/2019  . Microcytosis 10/04/2019  . Seborrheic dermatitis of scalp 10/04/2019  . Class 3 severe obesity with body mass index (BMI) of 40.0 to 44.9 in adult [facility]) 10/04/2019  . Prothrombin gene mutation (HCC) 10/04/2019  . S/P total hysterectomy and bilateral salpingo-oophorectomy 06/26/2019  . Hyperlipidemia 04/10/2019  . Essential hypertension 04/10/2019  . Prediabetes 04/10/2019  . Major depressive disorder with current active episode 04/10/2019  . Anemia 04/10/2019  . Gastroesophageal reflux disease 04/10/2019  . Allergic rhinitis 04/10/2019  . Bilateral polycystic ovarian syndrome 10/20/2012     Past Medical History:  Diagnosis Date  . Anemia   . Anxiety   . Family history of adverse reaction to anesthesia    malignant hyperthermia in 2 first cousins   . Family history of malignant hyperthermia   .  GERD (gastroesophageal reflux disease)   . Hypothyroidism   . Obesity affecting pregnancy   . PCOS (polycystic ovarian syndrome)   . Portal vein thrombosis   . Postpartum care following  cesarean delivery 12/19/2014  . Preeclampsia in postpartum period   . Pregnancy   . Previous cesarean delivery, delivered 12/16/2014  . Previous cesarean section   . Prothrombin gene mutation (HCC)   . S/P cesarean section 12/17/2014  . Scalp psoriasis   . Sleep apnea      Past Surgical History:  Procedure Laterality Date  . CESAREAN SECTION    . CESAREAN SECTION N/A 12/17/2014   Procedure: CESAREAN SECTION;  Surgeon: Conard Novak, MD;  Location: [facility] ORS;  Service: Obstetrics;  Laterality: N/A;  . CYSTOSCOPY  06/26/2019   Procedure: CYSTOSCOPY;  Surgeon: Ward, Elenora Fender, MD;  Location: [facility] ORS;  Service: Gynecology;;  . Bluford Kaufmann WITH STENT PLACEMENT Right 06/26/2019   Procedure: CYSTOSCOPY WITH STENT PLACEMENT, Cystotomy;  Surgeon: Ward, Elenora Fender, MD;  Location: [facility] ORS;  Service: Gynecology;  Laterality: Right;  . DIAGNOSTIC LAPAROSCOPY    . DILATION AND CURETTAGE OF UTERUS    . LAPAROSCOPIC CHOLECYSTECTOMY    . Lynch syndrome    . ROBOTIC ASSISTED TOTAL HYSTERECTOMY WITH BILATERAL SALPINGO OOPHERECTOMY Bilateral 06/26/2019   Procedure: XI ROBOTIC ASSISTED TOTAL HYSTERECTOMY WITH BILATERAL SALPINGO OOPHORECTOMY;  Surgeon: Ward, Elenora Fender, MD;  Location: [facility] ORS;  Service: Gynecology;  Laterality: Bilateral;    Social History   Socioeconomic History  . Marital status: Married    Spouse name: Chrissie Noa  . Number of children: 2  . Years of education: 80  . Highest education level: Bachelor's degree (e.g., BA, AB, BS)  Occupational History  . Occupation: [employer]  Tobacco Use  . Smoking status: Never Smoker  . Smokeless tobacco: Never Used  Substance and Sexual Activity  . Alcohol use: Yes  . Drug use: No  . Sexual activity: Yes    Birth control/protection: None  Other Topics Concern  . Not on file  Social History Narrative  . Not on file   Social Determinants of Health   Financial Resource Strain: Low Risk   . Difficulty of Paying Living Expenses: Not hard at all   Food Insecurity: No Food Insecurity  . Worried About Programme researcher, broadcasting/film/video in the Last Year: Never true  . Ran Out of Food in the Last Year: Never true  Transportation Needs: No Transportation Needs  . Lack of Transportation (Medical): No  . Lack of Transportation (Non-Medical): No  Physical Activity: Insufficiently Active  . Days of Exercise per Week: 4 days  . Minutes of Exercise per Session: 20 min  Stress: No Stress Concern Present  . Feeling of Stress : Not at all  Social Connections: Somewhat Isolated  . Frequency of Communication with Friends and Family: Three times a week  . Frequency of Social Gatherings with Friends and Family: Not on file  . Attends Religious Services: Never  . Active Member of Clubs or Organizations: No  . Attends Banker Meetings: Never  . Marital Status: Married  Catering manager Violence: Not At Risk  . Fear of Current or Ex-Partner: No  . Emotionally Abused: No  . Physically Abused: No  . Sexually Abused: No     Family History  Problem Relation Age of Onset  . Malignant hyperthermia Cousin   . Deep vein thrombosis Maternal Grandmother   . Hypertension Mother   . Depression Mother   .  COPD Mother   . Ovarian cancer Sister   . Stroke Maternal Grandfather   . Heart attack Maternal Grandfather   . Malignant hyperthermia Other   . Encopresis Son      Current Outpatient Medications:  .  cetirizine (ZYRTEC) 10 MG tablet, Take 10 mg by mouth daily. , Disp: , Rfl:  .  EPINEPHrine 0.3 mg/0.3 mL IJ SOAJ injection, Inject 0.3 mLs (0.3 mg total) into the muscle as needed for anaphylaxis., Disp: 1 each, Rfl: 0 .  ketoconazole (NIZORAL) 2 % shampoo, Apply 1 application topically 2 (two) times a week., Disp: 120 mL, Rfl: 2 .  levothyroxine (SYNTHROID) 100 MCG tablet, Take 1 tablet (100 mcg total) by mouth daily. Take on empty stomach at least 30 minutes before breakfast, Disp: 90 tablet, Rfl: 3 .  metFORMIN (GLUCOPHAGE-XR) 500 MG 24 hr  tablet, Take 2 tablets (1,000 mg total) by mouth 2 (two) times daily with a meal., Disp: 360 tablet, Rfl: 3 .  pantoprazole (PROTONIX) 40 MG tablet, Take 1 tablet (40 mg total) by mouth every morning., Disp: 90 tablet, Rfl: 1 .  phentermine (ADIPEX-P) 37.5 MG tablet, Take 0.5 tablets (18.75 mg total) by mouth daily before breakfast., Disp: 30 tablet, Rfl: 0 .  triamcinolone (NASACORT) 55 MCG/ACT AERO nasal inhaler, Place 2 sprays into the nose daily., Disp: , Rfl: 12 .  vortioxetine HBr (TRINTELLIX) 20 MG TABS tablet, Take 1 tablet (20 mg total) by mouth daily., Disp: 90 tablet, Rfl: 3   Physical exam:  Vitals:   12/14/19 1342  BP: (!) 146/82  Pulse: 86  Resp: 16  Temp: (!) 97 F (36.1 C)  TempSrc: Tympanic  SpO2: 99%  Weight: 252 lb 11.2 oz (114.6 kg)   Physical Exam Constitutional:      General: She is not in acute distress. Cardiovascular:     Rate and Rhythm: Normal rate and regular rhythm.     Heart sounds: Normal heart sounds.  Pulmonary:     Effort: Pulmonary effort is normal.     Breath sounds: Normal breath sounds.  Abdominal:     General: Bowel sounds are normal.     Palpations: Abdomen is soft.  Skin:    General: Skin is warm and dry.  Neurological:     Mental Status: She is alert and oriented to person, place, and time.        CMP Latest Ref Rng & Units 10/02/2019  Glucose 65 - 99 mg/dL 110(H)  BUN 7 - 25 mg/dL 15  Creatinine 0.50 - 1.10 mg/dL 0.95  Sodium 135 - 146 mmol/L 140  Potassium 3.5 - 5.3 mmol/L 4.3  Chloride 98 - 110 mmol/L 103  CO2 20 - 32 mmol/L 27  Calcium 8.6 - 10.2 mg/dL 9.8  Total Protein 6.1 - 8.1 g/dL 7.3  Total Bilirubin 0.2 - 1.2 mg/dL 0.4  Alkaline Phos 39 - 117 IU/L -  AST 10 - 30 U/L 27  ALT 6 - 29 U/L 42(H)   CBC Latest Ref Rng & Units 11/10/2019  WBC 3.8 - 10.8 Thousand/uL 8.8  Hemoglobin 11.7 - 15.5 g/dL 10.9(L)  Hematocrit 35.0 - 45.0 % 35.3  Platelets 140 - 400 Thousand/uL 274     Assessment and plan- Patient is a 44  y.o. female referred for iron deficiency anemia  Given her chronic microcytic anemia she would benefit from IV iron.  I would recommend 2 doses of Feraheme 510 mg IV weekly.  Discussed risks and benefits of Feraheme including  all but not limited to nausea, headache, leg swelling and possible risk of infusion reaction.  Patient understands and agrees to proceed as planned. will check CBC with differential, B12, folate, reticulocyte count, celiac disease panel, urinalysis along with ferritin and iron studies in 2 months.  No labs today   With regards to etiology of iron deficiency anemia: She follows up with GI at [facility] and underwent EGD and colonoscopy in February 2021 Colonoscopy showed 2 mm polyp in the distal descending colon but was negative for malignancy.  Perianal skin tags.  EGD showed multiple gastric polyps showed fundic gland polyps with no evidence of dysplasia.  Left colon biopsy showed tubular adenoma.  She has not undergone capsule study yet.  Given her ongoing fatigue and iron deficiency and inability to tolerate PO iron, trial of IV iron is reasonable.   Per her insurance she can get feraheme. Recommend 2 doses of feraheme 510 mg IV weekly X2. Discussed risks and benefits of IV iron including all but not limited to headache, leg swelling, rash and possible risk of infusion reaction. Patient understands and agrees to proceed as planned  I will see her in 2 months and add further anemia work up at that time including cbc ferritin and iron studies, b12 folate, TSH and retic count  H/o prothrombin gene mutation and prior h/o Portal venous thrombosis. Not currently on anticoagulation. No thromboembolic episodes in the last 5 years. Continue to monitor  Thank you for this kind referral and the opportunity to participate in the care of this patient   Visit Diagnosis 1. Iron deficiency anemia, unspecified iron deficiency anemia type     Dr. Owens Shark, MD, MPH [facility] [phone] 12/14/2019

## 2019-12-14 NOTE — Progress Notes (Signed)
Pt has had prothrombin gene since childhood. Used blood thinner while pregnant, when taking airplane trip, with surgery. She has clot in portal vein long time ago. She had hysterectomy in dec 2020. The doctor nicked a vessel and when to cauterize it and burned a hole in bladder and had to have that repaired. Her hgb has not been back to normal since that date. She eats pretty healthy, hardly eats out, cooks three meals a day. No blood in stool or urine. She also has been tested due to sister with ovarian cancer and she is positive for lynch syndrome

## 2019-12-17 ENCOUNTER — Encounter: Payer: Self-pay | Admitting: Oncology

## 2019-12-19 ENCOUNTER — Inpatient Hospital Stay: Payer: BC Managed Care – PPO

## 2019-12-19 ENCOUNTER — Other Ambulatory Visit: Payer: Self-pay

## 2019-12-19 VITALS — BP 143/84 | HR 88 | Temp 97.9°F | Resp 18

## 2019-12-19 DIAGNOSIS — Z90710 Acquired absence of both cervix and uterus: Secondary | ICD-10-CM | POA: Diagnosis not present

## 2019-12-19 DIAGNOSIS — F329 Major depressive disorder, single episode, unspecified: Secondary | ICD-10-CM | POA: Diagnosis not present

## 2019-12-19 DIAGNOSIS — Z86718 Personal history of other venous thrombosis and embolism: Secondary | ICD-10-CM | POA: Diagnosis not present

## 2019-12-19 DIAGNOSIS — K219 Gastro-esophageal reflux disease without esophagitis: Secondary | ICD-10-CM | POA: Diagnosis not present

## 2019-12-19 DIAGNOSIS — I10 Essential (primary) hypertension: Secondary | ICD-10-CM | POA: Diagnosis not present

## 2019-12-19 DIAGNOSIS — D509 Iron deficiency anemia, unspecified: Secondary | ICD-10-CM

## 2019-12-19 DIAGNOSIS — Z7984 Long term (current) use of oral hypoglycemic drugs: Secondary | ICD-10-CM | POA: Diagnosis not present

## 2019-12-19 DIAGNOSIS — Z90722 Acquired absence of ovaries, bilateral: Secondary | ICD-10-CM | POA: Diagnosis not present

## 2019-12-19 DIAGNOSIS — E039 Hypothyroidism, unspecified: Secondary | ICD-10-CM | POA: Diagnosis not present

## 2019-12-19 DIAGNOSIS — G4730 Sleep apnea, unspecified: Secondary | ICD-10-CM | POA: Diagnosis not present

## 2019-12-19 DIAGNOSIS — E282 Polycystic ovarian syndrome: Secondary | ICD-10-CM | POA: Diagnosis not present

## 2019-12-19 DIAGNOSIS — Z9079 Acquired absence of other genital organ(s): Secondary | ICD-10-CM | POA: Diagnosis not present

## 2019-12-19 DIAGNOSIS — D6852 Prothrombin gene mutation: Secondary | ICD-10-CM | POA: Diagnosis not present

## 2019-12-19 DIAGNOSIS — Z79899 Other long term (current) drug therapy: Secondary | ICD-10-CM | POA: Diagnosis not present

## 2019-12-19 DIAGNOSIS — E785 Hyperlipidemia, unspecified: Secondary | ICD-10-CM | POA: Diagnosis not present

## 2019-12-19 MED ORDER — SODIUM CHLORIDE 0.9 % IV SOLN
Freq: Once | INTRAVENOUS | Status: AC
  Filled 2019-12-19: qty 250

## 2019-12-19 MED ORDER — SODIUM CHLORIDE 0.9 % IV SOLN
510.0000 mg | Freq: Once | INTRAVENOUS | Status: AC
  Administered 2019-12-19: 510 mg via INTRAVENOUS
  Filled 2019-12-19: qty 510

## 2019-12-19 NOTE — Progress Notes (Signed)
Pt tolerated feraheme well. No s/s of reaction or distress noted.  B/P 143/84, Pt reports that is is elevated from baseline.  Per Dr. Smith Robert pt to follow up with PCP, Per Pt, visit with PCP is scheduled in "a few weeks".  Pt educated to call PCP if B/p continues to increase or if symptoms develop. Pt educated to monitor b/p at home until visit. Pt verbalizes understanding and agrees with plan.  Pt stable at discharge.

## 2019-12-20 MED ORDER — INSULIN PEN NEEDLE 29G X 8MM MISC: 1 refills | Status: DC

## 2019-12-22 DIAGNOSIS — G4733 Obstructive sleep apnea (adult) (pediatric): Secondary | ICD-10-CM | POA: Diagnosis not present

## 2019-12-26 ENCOUNTER — Inpatient Hospital Stay: Payer: BC Managed Care – PPO

## 2019-12-27 ENCOUNTER — Inpatient Hospital Stay: Payer: BC Managed Care – PPO

## 2019-12-27 ENCOUNTER — Other Ambulatory Visit: Payer: Self-pay

## 2019-12-27 VITALS — BP 136/83 | HR 84 | Temp 97.9°F | Resp 18

## 2019-12-27 DIAGNOSIS — Z86718 Personal history of other venous thrombosis and embolism: Secondary | ICD-10-CM | POA: Diagnosis not present

## 2019-12-27 DIAGNOSIS — Z7984 Long term (current) use of oral hypoglycemic drugs: Secondary | ICD-10-CM | POA: Diagnosis not present

## 2019-12-27 DIAGNOSIS — D509 Iron deficiency anemia, unspecified: Secondary | ICD-10-CM

## 2019-12-27 DIAGNOSIS — E785 Hyperlipidemia, unspecified: Secondary | ICD-10-CM | POA: Diagnosis not present

## 2019-12-27 DIAGNOSIS — Z9079 Acquired absence of other genital organ(s): Secondary | ICD-10-CM | POA: Diagnosis not present

## 2019-12-27 DIAGNOSIS — Z90710 Acquired absence of both cervix and uterus: Secondary | ICD-10-CM | POA: Diagnosis not present

## 2019-12-27 DIAGNOSIS — K219 Gastro-esophageal reflux disease without esophagitis: Secondary | ICD-10-CM | POA: Diagnosis not present

## 2019-12-27 DIAGNOSIS — G4730 Sleep apnea, unspecified: Secondary | ICD-10-CM | POA: Diagnosis not present

## 2019-12-27 DIAGNOSIS — Z79899 Other long term (current) drug therapy: Secondary | ICD-10-CM | POA: Diagnosis not present

## 2019-12-27 DIAGNOSIS — E282 Polycystic ovarian syndrome: Secondary | ICD-10-CM | POA: Diagnosis not present

## 2019-12-27 DIAGNOSIS — E039 Hypothyroidism, unspecified: Secondary | ICD-10-CM | POA: Diagnosis not present

## 2019-12-27 DIAGNOSIS — F329 Major depressive disorder, single episode, unspecified: Secondary | ICD-10-CM | POA: Diagnosis not present

## 2019-12-27 DIAGNOSIS — Z90722 Acquired absence of ovaries, bilateral: Secondary | ICD-10-CM | POA: Diagnosis not present

## 2019-12-27 DIAGNOSIS — I10 Essential (primary) hypertension: Secondary | ICD-10-CM | POA: Diagnosis not present

## 2019-12-27 DIAGNOSIS — D6852 Prothrombin gene mutation: Secondary | ICD-10-CM | POA: Diagnosis not present

## 2019-12-27 MED ORDER — SODIUM CHLORIDE 0.9 % IV SOLN
Freq: Once | INTRAVENOUS | Status: AC
  Filled 2019-12-27: qty 250

## 2019-12-27 MED ORDER — SODIUM CHLORIDE 0.9 % IV SOLN
510.0000 mg | Freq: Once | INTRAVENOUS | Status: AC
  Administered 2019-12-27: 510 mg via INTRAVENOUS
  Filled 2019-12-27: qty 510

## 2020-01-09 ENCOUNTER — Other Ambulatory Visit: Payer: Self-pay | Admitting: Family Medicine

## 2020-01-09 DIAGNOSIS — Z6841 Body Mass Index (BMI) 40.0 and over, adult: Secondary | ICD-10-CM

## 2020-01-09 DIAGNOSIS — E119 Type 2 diabetes mellitus without complications: Secondary | ICD-10-CM

## 2020-01-28 ENCOUNTER — Encounter: Payer: Self-pay | Admitting: Family Medicine

## 2020-02-02 DIAGNOSIS — D2362 Other benign neoplasm of skin of left upper limb, including shoulder: Secondary | ICD-10-CM | POA: Diagnosis not present

## 2020-02-02 DIAGNOSIS — D485 Neoplasm of uncertain behavior of skin: Secondary | ICD-10-CM | POA: Diagnosis not present

## 2020-02-13 ENCOUNTER — Encounter: Payer: Self-pay | Admitting: Family Medicine

## 2020-02-13 ENCOUNTER — Other Ambulatory Visit: Payer: Self-pay

## 2020-02-13 ENCOUNTER — Ambulatory Visit: Payer: BC Managed Care – PPO | Admitting: Family Medicine

## 2020-02-13 VITALS — BP 124/78 | HR 77 | Temp 97.8°F | Resp 18 | Ht 64.0 in | Wt 243.2 lb

## 2020-02-13 DIAGNOSIS — E8881 Metabolic syndrome: Secondary | ICD-10-CM

## 2020-02-13 DIAGNOSIS — Z6841 Body Mass Index (BMI) 40.0 and over, adult: Secondary | ICD-10-CM

## 2020-02-13 DIAGNOSIS — E119 Type 2 diabetes mellitus without complications: Secondary | ICD-10-CM

## 2020-02-13 DIAGNOSIS — Z79899 Other long term (current) drug therapy: Secondary | ICD-10-CM

## 2020-02-13 MED ORDER — SAXENDA 18 MG/3ML ~~LOC~~ SOPN: PEN_INJECTOR | SUBCUTANEOUS | 3 refills | Status: DC

## 2020-02-13 NOTE — Patient Instructions (Signed)
I would back off on metformin    Dosing information Usual Adult Dose of Saxenda for Weight Loss:  Dose escalation should be followed to reduce the likelihood of gastrointestinal symptoms; dose escalation may be delayed by 1 additional week if necessary: Week 1: Inject 0.6 mg subcutaneously once a day Week 2: Inject 1.2 mg subcutaneously once a day** Week 3: Inject 1.8 mg subcutaneously once a day Week 4: Inject 2.4 mg subcutaneously once a day Week 5: Inject 3 mg subcutaneously once a day Maintenance dose: 3 mg subcutaneously once a day  Comments: -Consider dose reduction of the insulin secretagogue to reduce the risk of hypoglycemia; conversely when discontinuing use in a patient with type 2 diabetes, monitor for an increase in blood glucose. -If a dose of 3 mg once daily is not tolerated, discontinuation is recommended; efficacy for chronic weight management has not been established at lower doses. -Evaluate weight loss at 16 weeks; if at least 4% of body weight has not been lost, it is unlikely the patient will achieve and sustain clinically meaningful weight loss with continued treatment.  Use: In adult patients with an initial BMI of 30 kg/m2 or greater (obese) or an initial BMI of 27 kg/m2 (overweight) or greater in the presence of at least 1 weight-related comorbid condition (e.g., hypertension, type 2 diabetes mellitus, or dyslipidemia), this drug is indicated as an adjunct to a reduced-calorie diet and increased physical activity for chronic weight management.

## 2020-02-13 NOTE — Progress Notes (Signed)
Patient ID: Joanna Scott, female    DOB: 1976-06-04, 44 y.o.   MRN: [id number]  PCP: Danelle Berry, PA-C  Chief Complaint  Patient presents with  . Obesity    follow up medication  . Medication Refill    Subjective:   Joanna Scott is a 44 y.o. female, presents to clinic with CC of the following:  HPI  Bloating is the patient's only reported side effect after starting weekly medication and increasing the dose seem to resolved after a day or 2 Saxenda daily dosing for the past 2 weeks-prior to that was doing weekly dosing and had not noticed any concerning side effects or benefit with her diet, appetite or weight She is currently in the 1.2 mg daily dosing, she feels that it has just started to curb her appetite and help with cravings particularly sweets  Wt Readings from Last 5 Encounters:  02/13/20 243 lb 3.2 oz (110.3 kg)  12/14/19 252 lb 11.2 oz (114.6 kg)  11/29/19 251 lb 1.6 oz (113.9 kg)  11/10/19 253 lb 6.4 oz (114.9 kg)  10/02/19 252 lb 4.8 oz (114.4 kg)   BMI Readings from Last 5 Encounters:  02/13/20 41.75 kg/m  12/14/19 43.38 kg/m  11/29/19 43.10 kg/m  11/10/19 43.50 kg/m  10/02/19 43.31 kg/m   She notes about 9 pounds weight loss since starting daily dosing, she has decreased her portions and calories, is eating less junk food    Patient Active Problem List   Diagnosis Date Noted  . Iron deficiency anemia 12/14/2019  . Hypothyroidism 10/04/2019  . Anemia associated with acute blood loss 10/04/2019  . Microcytosis 10/04/2019  . Seborrheic dermatitis of scalp 10/04/2019  . Class 3 severe obesity with body mass index (BMI) of 40.0 to 44.9 in adult [facility]) 10/04/2019  . Prothrombin gene mutation (HCC) 10/04/2019  . S/P total hysterectomy and bilateral salpingo-oophorectomy 06/26/2019  . Hyperlipidemia 04/10/2019  . Essential hypertension 04/10/2019  . Prediabetes 04/10/2019  . Major depressive disorder with current active episode 04/10/2019  . Anemia  04/10/2019  . Gastroesophageal reflux disease 04/10/2019  . Allergic rhinitis 04/10/2019  . Bilateral polycystic ovarian syndrome 10/20/2012      Current Outpatient Medications:  .  aspirin EC 81 MG tablet, Take 81 mg by mouth daily., Disp: , Rfl:  .  cetirizine (ZYRTEC) 10 MG tablet, Take 10 mg by mouth daily. , Disp: , Rfl:  .  ketoconazole (NIZORAL) 2 % shampoo, Apply 1 application topically 2 (two) times a week., Disp: 120 mL, Rfl: 2 .  levothyroxine (SYNTHROID) 100 MCG tablet, Take 1 tablet (100 mcg total) by mouth daily. Take on empty stomach at least 30 minutes before breakfast, Disp: 90 tablet, Rfl: 3 .  metFORMIN (GLUCOPHAGE-XR) 500 MG 24 hr tablet, Take 2 tablets (1,000 mg total) by mouth 2 (two) times daily with a meal., Disp: 360 tablet, Rfl: 3 .  pantoprazole (PROTONIX) 40 MG tablet, Take 1 tablet (40 mg total) by mouth every morning. (Patient taking differently: Take 40 mg by mouth every morning. ), Disp: 90 tablet, Rfl: 1 .  triamcinolone (NASACORT) 55 MCG/ACT AERO nasal inhaler, Place 2 sprays into the nose daily., Disp: , Rfl: 12 .  vortioxetine HBr (TRINTELLIX) 20 MG TABS tablet, Take 1 tablet (20 mg total) by mouth daily., Disp: 90 tablet, Rfl: 3 .  EPINEPHrine 0.3 mg/0.3 mL IJ SOAJ injection, Inject 0.3 mLs (0.3 mg total) into the muscle as needed for anaphylaxis. (Patient not taking: Reported on 02/13/2020), Disp:  1 each, Rfl: 0 .  Insulin Pen Needle 29G X MISC, Use as directed with saxenda injections (Patient not taking: Reported on 02/13/2020), Disp: 100 each, Rfl: 1 .  SAXENDA 18 MG/3ML SOPN, SMARTSIG:0.1 Milliliter(s) SUB-Q Once a Week, Disp: , Rfl:    Allergies  Allergen Reactions  . Peach [Prunus Persica] Anaphylaxis and Nausea And Vomiting  . Tree Extract Itching    Sneezing and itchy eyes Charletta Cousin and [place]     Social History   Tobacco Use  . Smoking status: Former Smoker    Packs/day: 1.00    Years: 0.00    Pack years: 0.00  . Smokeless tobacco: Never  Used  . Tobacco comment: pt stopped at age 47  Vaping Use  . Vaping Use: Never used  Substance Use Topics  . Alcohol use: Yes    Comment: 2 times a year for special occasions  . Drug use: No      Chart Review Today: I personally reviewed active problem list, medication list, allergies, family history, social history, health maintenance, notes from last encounter, lab results, imaging with the patient/caregiver today.   Review of Systems 10 Systems reviewed and are negative for acute change except as noted in the HPI.     Objective:   Vitals:   02/13/20 1443  BP: 124/78  Pulse: 77  Resp: 18  Temp: 97.8 F (36.6 C)  TempSrc: Temporal  SpO2: 99%  Weight: 243 lb 3.2 oz (110.3 kg)  Height: 5\' 4"  (1.626 m)    Body mass index is 41.75 kg/m.  Physical Exam Vitals and nursing note reviewed.  Constitutional:      Appearance: She is well-developed.  HENT:     Head: Normocephalic and atraumatic.  Eyes:     General:        Right eye: No discharge.        Left eye: No discharge.     Conjunctiva/sclera: Conjunctivae normal.  Neck:     Trachea: No tracheal deviation.  Cardiovascular:     Rate and Rhythm: Normal rate and regular rhythm.  Pulmonary:     Effort: Pulmonary effort is normal. No respiratory distress.     Breath sounds: No stridor.  Musculoskeletal:        General: Normal range of motion.  Skin:    General: Skin is warm and dry.     Findings: No rash.  Neurological:     Mental Status: She is alert.     Motor: No abnormal muscle tone.     Coordination: Coordination normal.  Psychiatric:        Behavior: Behavior normal.      Results for orders placed or performed in visit on 11/10/19  CBC with Differential/Platelet  Result Value Ref Range   WBC 8.8 3.8 - 10.8 Thousand/uL   RBC 4.97 3.80 - 5.10 Million/uL   Hemoglobin 10.9 (L) 11.7 - 15.5 g/dL   HCT 11/12/19 35 - 45 %   MCV 71.0 (L) 80.0 - 100.0 fL   MCH 21.9 (L) 27.0 - 33.0 pg   MCHC 30.9 (L) 32.0 -  36.0 g/dL   RDW 29.5 (H) 28.4 - 13.2 %   Platelets 274 140 - 400 Thousand/uL   MPV 10.8 7.5 - 12.5 fL   Neutro Abs 5,878 1,500 - 7,800 cells/uL   Lymphs Abs 2,138 850 - 3,900 cells/uL   Absolute Monocytes 528 200 - 950 cells/uL   Eosinophils Absolute 211 15 - 500 cells/uL   Basophils  Absolute 44 0 - 200 cells/uL   Neutrophils Relative % 66.8 %   Total Lymphocyte 24.3 %   Monocytes Relative 6.0 %   Eosinophils Relative 2.4 %   Basophils Relative 0.5 %  Iron, TIBC and Ferritin Panel  Result Value Ref Range   Iron 29 (L) 40 - 190 mcg/dL   TIBC 456 (H) 256 - 389 mcg/dL (calc)   %SAT 6 (L) 16 - 45 % (calc)   Ferritin 17 16 - 232 ng/mL       Assessment & Plan:  1. Encounter for medication management Pt doing well starting daily saxenda - she thinks that she will increase the dose more slowly than weekly depending on her side effects So for no concerning side effects and she is having successful weight loss, currently down 9 pounds, working on smaller portions, reduce calories and she is craving less junk food and snacks - Liraglutide -Weight Management (SAXENDA) 18 MG/3ML SOPN; Inject 0.6 mg into the skin daily. Increase by 0.6mg  q week until max dose of 3mg  is reached. (Patient taking differently: Inject 1.2 mg into the skin daily. Inject 0.6 mg into the skin daily. Increase by 0.6mg  q week until max dose of 3mg  is reached.)  Dispense: 15 mL; Refill: 3  2. Metabolic syndrome - Liraglutide -Weight Management (SAXENDA) 18 MG/3ML SOPN; Inject 0.6 mg into the skin daily. Increase by 0.6mg  q week until max dose of 3mg  is reached. (Patient taking differently: Inject 1.2 mg into the skin daily. Inject 0.6 mg into the skin daily. Increase by 0.6mg  q week until max dose of 3mg  is reached.)  Dispense: 15 mL; Refill: 3  3. Class 3 severe obesity with body mass index (BMI) of 40.0 to 44.9 in adult, unspecified obesity type, unspecified whether serious comorbidity present (HCC) - Liraglutide -Weight  Management (SAXENDA) 18 MG/3ML SOPN; Inject 0.6 mg into the skin daily. Increase by 0.6mg  q week until max dose of 3mg  is reached. (Patient taking differently: Inject 1.2 mg into the skin daily. Inject 0.6 mg into the skin daily. Increase by 0.6mg  q week until max dose of 3mg  is reached.)  Dispense: 15 mL; Refill: 3  4. Type 2 diabetes mellitus without complication, without long-term current use of insulin (HCC) Will check labs at next OV - pt will try holding metformin and continuing saxenda - to avoid SE Pt does have good glycemic control Lab Results  Component Value Date   HGBA1C 5.8 (H) 10/02/2019  Labs due next OV - Liraglutide -Weight Management (SAXENDA) 18 MG/3ML SOPN; Inject 0.6 mg into the skin daily. Increase by 0.6mg  q week until max dose of 3mg  is reached. (Patient taking differently: Inject 1.2 mg into the skin daily. Inject 0.6 mg into the skin daily. Increase by 0.6mg  q week until max dose of 3mg  is reached.)  Dispense: 15 mL; Refill: 3  , PA-C 02/13/20 3:06 PM

## 2020-02-29 ENCOUNTER — Inpatient Hospital Stay: Payer: BC Managed Care – PPO | Admitting: Oncology

## 2020-02-29 ENCOUNTER — Inpatient Hospital Stay: Payer: BC Managed Care – PPO

## 2020-03-04 ENCOUNTER — Inpatient Hospital Stay: Payer: BC Managed Care – PPO

## 2020-03-04 ENCOUNTER — Inpatient Hospital Stay: Payer: BC Managed Care – PPO | Admitting: Oncology

## 2020-03-04 ENCOUNTER — Telehealth: Payer: Self-pay | Admitting: Oncology

## 2020-03-04 NOTE — Telephone Encounter (Signed)
Patient did not complete labs on this date, so she will not be able to complete MD appt. Writer spoke with patient and rescheduled appts for 03-05-20.

## 2020-03-05 ENCOUNTER — Other Ambulatory Visit: Payer: Self-pay

## 2020-03-05 ENCOUNTER — Inpatient Hospital Stay (HOSPITAL_BASED_OUTPATIENT_CLINIC_OR_DEPARTMENT_OTHER): Payer: BC Managed Care – PPO | Admitting: Oncology

## 2020-03-05 ENCOUNTER — Inpatient Hospital Stay: Payer: BC Managed Care – PPO | Attending: Oncology

## 2020-03-05 ENCOUNTER — Encounter: Payer: Self-pay | Admitting: Oncology

## 2020-03-05 DIAGNOSIS — G4730 Sleep apnea, unspecified: Secondary | ICD-10-CM | POA: Diagnosis not present

## 2020-03-05 DIAGNOSIS — Z79899 Other long term (current) drug therapy: Secondary | ICD-10-CM | POA: Insufficient documentation

## 2020-03-05 DIAGNOSIS — Z98891 History of uterine scar from previous surgery: Secondary | ICD-10-CM | POA: Insufficient documentation

## 2020-03-05 DIAGNOSIS — Z8249 Family history of ischemic heart disease and other diseases of the circulatory system: Secondary | ICD-10-CM | POA: Insufficient documentation

## 2020-03-05 DIAGNOSIS — E282 Polycystic ovarian syndrome: Secondary | ICD-10-CM | POA: Diagnosis not present

## 2020-03-05 DIAGNOSIS — Z86718 Personal history of other venous thrombosis and embolism: Secondary | ICD-10-CM | POA: Diagnosis not present

## 2020-03-05 DIAGNOSIS — Z90710 Acquired absence of both cervix and uterus: Secondary | ICD-10-CM | POA: Insufficient documentation

## 2020-03-05 DIAGNOSIS — K5900 Constipation, unspecified: Secondary | ICD-10-CM | POA: Diagnosis not present

## 2020-03-05 DIAGNOSIS — D6852 Prothrombin gene mutation: Secondary | ICD-10-CM | POA: Diagnosis not present

## 2020-03-05 DIAGNOSIS — K219 Gastro-esophageal reflux disease without esophagitis: Secondary | ICD-10-CM | POA: Insufficient documentation

## 2020-03-05 DIAGNOSIS — E039 Hypothyroidism, unspecified: Secondary | ICD-10-CM | POA: Diagnosis not present

## 2020-03-05 DIAGNOSIS — Z9049 Acquired absence of other specified parts of digestive tract: Secondary | ICD-10-CM | POA: Insufficient documentation

## 2020-03-05 DIAGNOSIS — Z7982 Long term (current) use of aspirin: Secondary | ICD-10-CM | POA: Insufficient documentation

## 2020-03-05 DIAGNOSIS — Z87891 Personal history of nicotine dependence: Secondary | ICD-10-CM | POA: Diagnosis not present

## 2020-03-05 DIAGNOSIS — D509 Iron deficiency anemia, unspecified: Secondary | ICD-10-CM

## 2020-03-05 DIAGNOSIS — Z90722 Acquired absence of ovaries, bilateral: Secondary | ICD-10-CM | POA: Diagnosis not present

## 2020-03-05 DIAGNOSIS — I81 Portal vein thrombosis: Secondary | ICD-10-CM | POA: Diagnosis not present

## 2020-03-05 LAB — CBC WITH DIFFERENTIAL/PLATELET
Abs Immature Granulocytes: 0.02 10*3/uL (ref 0.00–0.07)
Basophils Absolute: 0.1 10*3/uL (ref 0.0–0.1)
Basophils Relative: 1 %
Eosinophils Absolute: 0.2 10*3/uL (ref 0.0–0.5)
Eosinophils Relative: 2 %
HCT: 38.2 % (ref 36.0–46.0)
Hemoglobin: 13.2 g/dL (ref 12.0–15.0)
Immature Granulocytes: 0 %
Lymphocytes Relative: 25 %
Lymphs Abs: 2.1 10*3/uL (ref 0.7–4.0)
MCH: 27.2 pg (ref 26.0–34.0)
MCHC: 34.6 g/dL (ref 30.0–36.0)
MCV: 78.8 fL — ABNORMAL LOW (ref 80.0–100.0)
Monocytes Absolute: 0.6 10*3/uL (ref 0.1–1.0)
Monocytes Relative: 7 %
Neutro Abs: 5.6 10*3/uL (ref 1.7–7.7)
Neutrophils Relative %: 65 %
Platelets: 190 10*3/uL (ref 150–400)
RBC: 4.85 MIL/uL (ref 3.87–5.11)
RDW: 18.2 % — ABNORMAL HIGH (ref 11.5–15.5)
WBC: 8.6 10*3/uL (ref 4.0–10.5)
nRBC: 0 % (ref 0.0–0.2)

## 2020-03-05 LAB — RETICULOCYTES
Immature Retic Fract: 9.5 % (ref 2.3–15.9)
RBC.: 4.96 MIL/uL (ref 3.87–5.11)
Retic Count, Absolute: 86.3 10*3/uL (ref 19.0–186.0)
Retic Ct Pct: 1.7 % (ref 0.4–3.1)

## 2020-03-05 LAB — IRON AND TIBC
Iron: 47 ug/dL (ref 28–170)
Saturation Ratios: 12 % (ref 10.4–31.8)
TIBC: 385 ug/dL (ref 250–450)
UIBC: 338 ug/dL

## 2020-03-05 LAB — FOLATE: Folate: 10.4 ng/mL (ref >5.9)

## 2020-03-05 LAB — FERRITIN: Ferritin: 176 ng/mL (ref 11–307)

## 2020-03-05 LAB — VITAMIN B12: Vitamin B-12: 394 pg/mL (ref 180–914)

## 2020-03-05 LAB — TSH: TSH: 0.552 u[IU]/mL (ref 0.350–4.500)

## 2020-03-05 NOTE — Progress Notes (Signed)
Pt doing ok, she is fatigued but she stays that way and has2 kids that keep her busy. Eating and drinking good, BM's good. No trouble chewing or swallowing and no pyschosocial issues

## 2020-03-08 NOTE — Progress Notes (Signed)
I connected with Joanna Scott on 03/08/20 at  3:00 PM EDT by video enabled telemedicine visit and verified that I am speaking with the correct person using two identifiers.   I discussed the limitations, risks, security and privacy concerns of performing an evaluation and management service by telemedicine and the availability of in-person appointments. I also discussed with the patient that there may be a patient responsible charge related to this service. The patient expressed understanding and agreed to proceed.  Other persons participating in the visit and their role in the encounter:  none  Patient's location:  home Provider's location:  work  Stage manager Complaint: Routine follow-up of iron deficiency anemia  History of present illness:  Patient is a 44 year old female referred to [place] for iron deficiency anemia.  Her recent CBC from 11/10/2019 showed white count of 8.8, H&H of 10.9/35.3 with an MCV of 71 and a platelet count of 274.  Iron study showed a low ferritin of 17 and low iron saturation of 6% and elevated TIBC of 461.  Looking back at her prior CBCs patient has had chronic microcytic anemia and over the last 6 months her hemoglobin has been around 10.  TSH in March 2021 was normal. Patient hasd tried oral iron but unable to tolerate due to constipation. She is s/p hysterectomy. Patient sees [facility] and has had EGD and colonoscopy in feb 2021 which did not show any bleeding. Noted to have multiple gastric polyps which were consistent with fundic gland polyps. No evidence of dysplasia  Patient is heterozygous for prothrombin gene mutation. She developed portal venous thrombosis in 2016. She was on birth control at that time. She was on anticoagulation for about 8 months then stopped. She takes prophylactic anticoagulation prior to flight trips. She has not had any thromboembolic episodes since. Prior to portal venous thrombosis she has gone through C sections without any complications.   Interval  history: Reports some fatigue but denies other complaints at this time   Review of Systems  Constitutional: Positive for malaise/fatigue. Negative for chills, fever and weight loss.  HENT: Negative for congestion, ear discharge and nosebleeds.   Eyes: Negative for blurred vision.  Respiratory: Negative for cough, hemoptysis, sputum production, shortness of breath and wheezing.   Cardiovascular: Negative for chest pain, palpitations, orthopnea and claudication.  Gastrointestinal: Negative for abdominal pain, blood in stool, constipation, diarrhea, heartburn, melena, nausea and vomiting.  Genitourinary: Negative for dysuria, flank pain, frequency, hematuria and urgency.  Musculoskeletal: Negative for back pain, joint pain and myalgias.  Skin: Negative for rash.  Neurological: Negative for dizziness, tingling, focal weakness, seizures, weakness and headaches.  Endo/Heme/Allergies: Does not bruise/bleed easily.  Psychiatric/Behavioral: Negative for depression and suicidal ideas. The patient does not have insomnia.     Allergies  Allergen Reactions  . Peach [Prunus Persica] Anaphylaxis and Nausea And Vomiting  . Tree Extract Itching    Sneezing and itchy eyes [name]    Past Medical History:  Diagnosis Date  . Anemia   . Anxiety   . Family history of adverse reaction to anesthesia    malignant hyperthermia in 2 first cousins   . Family history of malignant hyperthermia   . GERD (gastroesophageal reflux disease)   . Hypothyroidism   . Obesity affecting pregnancy   . PCOS (polycystic ovarian syndrome)   . Portal vein thrombosis   . Postpartum care following cesarean delivery 12/19/2014  . Preeclampsia in postpartum period   . Pregnancy   . Previous  cesarean delivery, delivered 12/16/2014  . Previous cesarean section   . Prothrombin gene mutation (HCC)   . S/P cesarean section 12/17/2014  . Scalp psoriasis   . Sleep apnea     Past Surgical History:  Procedure Laterality Date   . CESAREAN SECTION    . CESAREAN SECTION N/A 12/17/2014   Procedure: CESAREAN SECTION;  Surgeon: Conard Novak, MD;  Location: [facility];  Service: Obstetrics;  Laterality: N/A;  . CYSTOSCOPY  06/26/2019   Procedure: CYSTOSCOPY;  Surgeon: Ward, Elenora Fender, MD;  Location: [facility];  Service: Gynecology;;  . Bluford Kaufmann WITH STENT PLACEMENT Right 06/26/2019   Procedure: CYSTOSCOPY WITH STENT PLACEMENT, Cystotomy;  Surgeon: Ward, Elenora Fender, MD;  Location: [facility];  Service: Gynecology;  Laterality: Right;  . DIAGNOSTIC LAPAROSCOPY    . DILATION AND CURETTAGE OF UTERUS    . LAPAROSCOPIC CHOLECYSTECTOMY    . Lynch syndrome    . ROBOTIC ASSISTED TOTAL HYSTERECTOMY WITH BILATERAL SALPINGO OOPHERECTOMY Bilateral 06/26/2019   Procedure: XI ROBOTIC ASSISTED TOTAL HYSTERECTOMY WITH BILATERAL SALPINGO OOPHORECTOMY;  Surgeon: Ward, Elenora Fender, MD;  Location: [facility];  Service: Gynecology;  Laterality: Bilateral;    Social History   Socioeconomic History  . Marital status: Married    Spouse name: Chrissie Noa  . Number of children: 2  . Years of education: 31  . Highest education level: Bachelor's degree (e.g., BA, AB, BS)  Occupational History  . Occupation: [employer]  Tobacco Use  . Smoking status: Former Smoker    Packs/day: 1.00    Years: 0.00    Pack years: 0.00  . Smokeless tobacco: Never Used  . Tobacco comment: pt stopped at age 61  Vaping Use  . Vaping Use: Never used  Substance and Sexual Activity  . Alcohol use: Yes    Comment: 2 times a year for special occasions  . Drug use: No  . Sexual activity: Yes    Birth control/protection: None  Other Topics Concern  . Not on file  Social History Narrative  . Not on file   Social Determinants of Health   Financial Resource Strain: Low Risk   . Difficulty of Paying Living Expenses: Not hard at all  Food Insecurity: No Food Insecurity  . Worried About Programme researcher, broadcasting/film/video in the Last Year: Never true  . Ran Out of Food in the Last  Year: Never true  Transportation Needs: No Transportation Needs  . Lack of Transportation (Medical): No  . Lack of Transportation (Non-Medical): No  Physical Activity: Insufficiently Active  . Days of Exercise per Week: 4 days  . Minutes of Exercise per Session: 20 min  Stress: No Stress Concern Present  . Feeling of Stress : Not at all  Social Connections: Moderately Isolated  . Frequency of Communication with Friends and Family: Three times a week  . Frequency of Social Gatherings with Friends and Family: Not on file  . Attends Religious Services: Never  . Active Member of Clubs or Organizations: No  . Attends Banker Meetings: Never  . Marital Status: Married  Catering manager Violence: Not At Risk  . Fear of Current or Ex-Partner: No  . Emotionally Abused: No  . Physically Abused: No  . Sexually Abused: No    Family History  Problem Relation Age of Onset  . Malignant hyperthermia Cousin   . Deep vein thrombosis Maternal Grandmother   . Hypertension Mother   . Depression Mother   . COPD Mother   . Ovarian  cancer Sister   . Stroke Maternal Grandfather   . Heart attack Maternal Grandfather   . Malignant hyperthermia Other   . Encopresis Son      Current Outpatient Medications:  .  aspirin EC 81 MG tablet, Take 81 mg by mouth daily., Disp: , Rfl:  .  cetirizine (ZYRTEC) 10 MG tablet, Take 10 mg by mouth daily. , Disp: , Rfl:  .  Insulin Pen Needle 29G X 8MM MISC, Use as directed with saxenda injections, Disp: 100 each, Rfl: 1 .  ketoconazole (NIZORAL) 2 % shampoo, Apply 1 application topically 2 (two) times a week., Disp: 120 mL, Rfl: 2 .  levothyroxine (SYNTHROID) 100 MCG tablet, Take 1 tablet (100 mcg total) by mouth daily. Take on empty stomach at least 30 minutes before breakfast, Disp: 90 tablet, Rfl: 3 .  Liraglutide -Weight Management (SAXENDA) 18 MG/3ML SOPN, Inject 0.6 mg into the skin daily. Increase by 0.6mg  q week until max dose of 3mg  is reached.  (Patient taking differently: Inject 1.2 mg into the skin daily. Inject 0.6 mg into the skin daily. Increase by 0.6mg  q week until max dose of 3mg  is reached.), Disp: 15 mL, Rfl: 3 .  metFORMIN (GLUCOPHAGE-XR) 500 MG 24 hr tablet, Take 2 tablets (1,000 mg total) by mouth 2 (two) times daily with a meal., Disp: 360 tablet, Rfl: 3 .  pantoprazole (PROTONIX) 40 MG tablet, Take 1 tablet (40 mg total) by mouth every morning. (Patient taking differently: Take 40 mg by mouth every morning. ), Disp: 90 tablet, Rfl: 1 .  triamcinolone (NASACORT) 55 MCG/ACT AERO nasal inhaler, Place 2 sprays into the nose daily., Disp: , Rfl: 12 .  vortioxetine HBr (TRINTELLIX) 20 MG TABS tablet, Take 1 tablet (20 mg total) by mouth daily., Disp: 90 tablet, Rfl: 3 .  EPINEPHrine 0.3 mg/0.3 mL IJ SOAJ injection, Inject 0.3 mLs (0.3 mg total) into the muscle as needed for anaphylaxis. (Patient not taking: Reported on 02/13/2020), Disp: 1 each, Rfl: 0  No results found.  No images are attached to the encounter.   CMP Latest Ref Rng & Units 10/02/2019  Glucose 65 - 99 mg/dL 409(W110(H)  BUN 7 - 25 mg/dL 15  Creatinine 1.190.50 - 1.471.10 mg/dL 8.290.95  Sodium 562135 - 130146 mmol/L 140  Potassium 3.5 - 5.3 mmol/L 4.3  Chloride 98 - 110 mmol/L 103  CO2 20 - 32 mmol/L 27  Calcium 8.6 - 10.2 mg/dL 9.8  Total Protein 6.1 - 8.1 g/dL 7.3  Total Bilirubin 0.2 - 1.2 mg/dL 0.4  Alkaline Phos 39 - 117 IU/L -  AST 10 - 30 U/L 27  ALT 6 - 29 U/L 42(H)   CBC Latest Ref Rng & Units 03/05/2020  WBC 4.0 - 10.5 K/uL 8.6  Hemoglobin 12.0 - 15.0 g/dL 86.513.2  Hematocrit 36 - 46 % 38.2  Platelets 150 - 400 K/uL 190     Observation/objective: Appears in no acute distress over video visit today.  Breathing is nonlabored  Assessment and plan: Patient is a 44 year old female with history of iron deficiency anemia and this is a routine follow-up visit  Patient's hemoglobin improved from 10.9-13.2 presently after receiving IV iron.  She still has some persistent  microcytosis with an MCV of 78.  Ferritin levels are normal at 176 and iron studies are also normal.  B12 folate and TSH are normal.  She does not require IV iron at this time.  Repeat CBC ferritin iron studies in 3 in 6 months  and I will see her back in 6 months  Follow-up instructions: As above  I discussed the assessment and treatment plan with the patient. The patient was provided an opportunity to ask questions and all were answered. The patient agreed with the plan and demonstrated an understanding of the instructions.   The patient was advised to call back or seek an in-person evaluation if the symptoms worsen or if the condition fails to improve as anticipated.  Visit Diagnosis: 1. Iron deficiency anemia, unspecified iron deficiency anemia type     Dr. Owens Shark, MD, MPH [facility] Tel- [phone] 03/08/2020 8:56 AM

## 2020-03-11 ENCOUNTER — Encounter: Payer: Self-pay | Admitting: Family Medicine

## 2020-03-29 NOTE — Progress Notes (Deleted)
Name: Joanna Scott   MRN: [id number]    DOB: 1976/07/09   Date:03/29/2020       Progress Note  No chief complaint on file.    Subjective:   Joanna Scott is a 44 y.o. female, presents to clinic for   DM:   Pt managing DM with Metformin 1000mg  2 tabs bid Reports *** med compliance Pt has *** SE from meds. Blood sugars *** Denies: Polyuria, polydipsia, vision changes, neuropathy, hypoglycemia Recent pertinent labs: Lab Results  Component Value Date   HGBA1C 5.8 (H) 10/02/2019   HGBA1C 6.2 (H) 11/09/2018   Standard of care and health maintenance: Urine Microalbumin:  ordered Foot exam:  Needs to be done DM eye exam:   ACEI/ARB:  *** Statin:  ***  Hyperlipidemia: Currently treated with ***, pt reports *** med compliance Last Lipids: Lab Results  Component Value Date   CHOL 215 (H) 11/09/2018   HDL 33 (L) 11/09/2018   LDLCALC 142 (H) 11/09/2018   TRIG 199 (H) 11/09/2018   CHOLHDL 6.5 (H) 11/09/2018   - {ACTIONS;DENIES/REPORTS:21021675::"Denies"}: Chest pain, shortness of breath, myalgias, claudication  Patient Active Problem List   Diagnosis Date Noted  . Iron deficiency anemia 12/14/2019  . Hypothyroidism 10/04/2019  . Anemia associated with acute blood loss 10/04/2019  . Microcytosis 10/04/2019  . Seborrheic dermatitis of scalp 10/04/2019  . Class 3 severe obesity with body mass index (BMI) of 40.0 to 44.9 in adult [facility]) 10/04/2019  . Prothrombin gene mutation (HCC) 10/04/2019  . S/P total hysterectomy and bilateral salpingo-oophorectomy 06/26/2019  . Hyperlipidemia 04/10/2019  . Essential hypertension 04/10/2019  . Prediabetes 04/10/2019  . Major depressive disorder with current active episode 04/10/2019  . Anemia 04/10/2019  . Gastroesophageal reflux disease 04/10/2019  . Allergic rhinitis 04/10/2019  . Bilateral polycystic ovarian syndrome 10/20/2012    Past Surgical History:  Procedure Laterality Date  . CESAREAN SECTION    . CESAREAN SECTION N/A  12/17/2014   Procedure: CESAREAN SECTION;  Surgeon: 02/16/2015, MD;  Location: [facility];  Service: Obstetrics;  Laterality: N/A;  . CYSTOSCOPY  06/26/2019   Procedure: CYSTOSCOPY;  Surgeon: Ward, 06/28/2019, MD;  Location: [facility];  Service: Gynecology;;  . Elenora Fender WITH STENT PLACEMENT Right 06/26/2019   Procedure: CYSTOSCOPY WITH STENT PLACEMENT, Cystotomy;  Surgeon: Ward, 06/28/2019, MD;  Location: [facility];  Service: Gynecology;  Laterality: Right;  . DIAGNOSTIC LAPAROSCOPY    . DILATION AND CURETTAGE OF UTERUS    . LAPAROSCOPIC CHOLECYSTECTOMY    . Lynch syndrome    . ROBOTIC ASSISTED TOTAL HYSTERECTOMY WITH BILATERAL SALPINGO OOPHERECTOMY Bilateral 06/26/2019   Procedure: XI ROBOTIC ASSISTED TOTAL HYSTERECTOMY WITH BILATERAL SALPINGO OOPHORECTOMY;  Surgeon: Ward, 06/28/2019, MD;  Location: [facility];  Service: Gynecology;  Laterality: Bilateral;    Family History  Problem Relation Age of Onset  . Malignant hyperthermia Cousin   . Deep vein thrombosis Maternal Grandmother   . Hypertension Mother   . Depression Mother   . COPD Mother   . Ovarian cancer Sister   . Stroke Maternal Grandfather   . Heart attack Maternal Grandfather   . Malignant hyperthermia Other   . Encopresis Son     Social History   Tobacco Use  . Smoking status: Former Smoker    Packs/day: 1.00    Years: 0.00    Pack years: 0.00  . Smokeless tobacco: Never Used  . Tobacco comment: pt stopped at age 53  Vaping Use  . Vaping Use:  Never used  Substance Use Topics  . Alcohol use: Yes    Comment: 2 times a year for special occasions  . Drug use: No     Allergies  Allergen Reactions  . Peach [Prunus Persica] Anaphylaxis and Nausea And Vomiting  . Tree Extract Itching    Sneezing and itchy eyes [facility]  Topic Date Due  . Hepatitis C Screening  Never done  . URINE MICROALBUMIN  Never done  . INFLUENZA VACCINE  02/11/2020  . TETANUS/TDAP  07/13/2024  . COVID-19  Vaccine  Completed  . HIV Screening  Completed  . PAP SMEAR-Modifier  Discontinued    Chart Review Today: ***  Review of Systems   Objective:   There were no vitals filed for this visit.  There is no height or weight on file to calculate BMI.  Physical Exam      Assessment & Plan:   ***  No follow-ups on file.   Marcos Eke, CMA 03/29/20 2:22 PM

## 2020-04-01 ENCOUNTER — Other Ambulatory Visit: Payer: Self-pay

## 2020-04-01 ENCOUNTER — Other Ambulatory Visit: Payer: Self-pay | Admitting: Family Medicine

## 2020-04-01 ENCOUNTER — Ambulatory Visit: Payer: BC Managed Care – PPO | Admitting: Family Medicine

## 2020-04-01 DIAGNOSIS — L219 Seborrheic dermatitis, unspecified: Secondary | ICD-10-CM

## 2020-04-05 NOTE — Progress Notes (Signed)
Name: Joanna Scott   MRN: [id number]    DOB: 02/15/1976   Date:04/08/2020       Progress Note  Chief Complaint  Patient presents with  . Follow-up    weight management     Subjective:   Joanna Scott is a 44 y.o. female, presents to clinic for weight and med check  Weight check:   On saxenda dose of 2.4 mg daily  Still curbing her sugar and sweet cravings Still having some SE- mostly nausea   Wt Readings from Last 5 Encounters:  04/08/20 239 lb 14.4 oz (108.8 kg)  02/13/20 243 lb 3.2 oz (110.3 kg)  12/14/19 252 lb 11.2 oz (114.6 kg)  11/29/19 251 lb 1.6 oz (113.9 kg)  11/10/19 253 lb 6.4 oz (114.9 kg)   BMI Readings from Last 5 Encounters:  04/08/20 41.18 kg/m  02/13/20 41.75 kg/m  12/14/19 43.38 kg/m  11/29/19 43.10 kg/m  11/10/19 43.50 kg/m   Diet:  Not counting calories, but eating small meals, cut out sweets   Exercise:  Working on steps - 34K a week average, doing yoga   Pt got daily Rx and dosing only sent into pharmacy in August 02/13/2020   The requested drug is being used as an adjunct to lifestyle modification (e.g., dietary or caloric restriction, exercise, behavioral support, community based program). The patient has engaged in > 5months of lifestyle modification and failed to achieve desired weight loss. The patient has BMI greater than or equal to 30 kilograms / square meter with additional disorder related to weight (high cholesterol, hypertension, diabetes, or sleep apnea).      Current Outpatient Medications:  .  aspirin EC 81 MG tablet, Take 81 mg by mouth daily., Disp: , Rfl:  .  cetirizine (ZYRTEC) 10 MG tablet, Take 10 mg by mouth daily. , Disp: , Rfl:  .  ketoconazole (NIZORAL) 2 % shampoo, APPLY 1 APPLICATION TOPICALLY 2 (TWO) TIMES A WEEK., Disp: 120 mL, Rfl: 2 .  levothyroxine (SYNTHROID) 100 MCG tablet, Take 1 tablet (100 mcg total) by mouth daily. Take on empty stomach at least 30 minutes before breakfast, Disp: 90 tablet, Rfl: 3 .   Liraglutide -Weight Management (SAXENDA) 18 MG/3ML SOPN, Inject 0.6 mg into the skin daily. Increase by 0.6mg  q week until max dose of 3mg  is reached. (Patient taking differently: Inject 1.2 mg into the skin daily. Inject 0.6 mg into the skin daily. Increase by 0.6mg  q week until max dose of 3mg  is reached.), Disp: 15 mL, Rfl: 3 .  metFORMIN (GLUCOPHAGE-XR) 500 MG 24 hr tablet, Take 2 tablets (1,000 mg total) by mouth 2 (two) times daily with a meal., Disp: 360 tablet, Rfl: 3 .  pantoprazole (PROTONIX) 40 MG tablet, Take 1 tablet (40 mg total) by mouth every morning. (Patient taking differently: Take 40 mg by mouth every morning. ), Disp: 90 tablet, Rfl: 1 .  vortioxetine HBr (TRINTELLIX) 20 MG TABS tablet, Take 1 tablet (20 mg total) by mouth daily., Disp: 90 tablet, Rfl: 3 .  EPINEPHrine 0.3 mg/0.3 mL IJ SOAJ injection, Inject 0.3 mLs (0.3 mg total) into the muscle as needed for anaphylaxis. (Patient not taking: Reported on 02/13/2020), Disp: 1 each, Rfl: 0 .  Insulin Pen Needle 29G X MISC, Use as directed with saxenda injections (Patient not taking: Reported on 04/08/2020), Disp: 100 each, Rfl: 1 .  triamcinolone (NASACORT) 55 MCG/ACT AERO nasal inhaler, Place 2 sprays into the nose daily. (Patient not taking: Reported on 04/08/2020),  Disp: , Rfl: 12  Patient Active Problem List   Diagnosis Date Noted  . Iron deficiency anemia 12/14/2019  . Hypothyroidism 10/04/2019  . Anemia associated with acute blood loss 10/04/2019  . Microcytosis 10/04/2019  . Seborrheic dermatitis of scalp 10/04/2019  . Class 3 severe obesity with body mass index (BMI) of 40.0 to 44.9 in adult [facility]) 10/04/2019  . Prothrombin gene mutation (HCC) 10/04/2019  . S/P total hysterectomy and bilateral salpingo-oophorectomy 06/26/2019  . Hyperlipidemia 04/10/2019  . Essential hypertension 04/10/2019  . Prediabetes 04/10/2019  . Major depressive disorder with current active episode 04/10/2019  . Anemia 04/10/2019  .  Gastroesophageal reflux disease 04/10/2019  . Allergic rhinitis 04/10/2019  . Bilateral polycystic ovarian syndrome 10/20/2012    Past Surgical History:  Procedure Laterality Date  . CESAREAN SECTION    . CESAREAN SECTION N/A 12/17/2014   Procedure: CESAREAN SECTION;  Surgeon: Conard Novak, MD;  Location: [facility];  Service: Obstetrics;  Laterality: N/A;  . CYSTOSCOPY  06/26/2019   Procedure: CYSTOSCOPY;  Surgeon: Ward, Elenora Fender, MD;  Location: [facility]S;  Service: Gynecology;;  . Bluford Kaufmann WITH STENT PLACEMENT Right 06/26/2019   Procedure: CYSTOSCOPY WITH STENT PLACEMENT, Cystotomy;  Surgeon: Ward, Elenora Fender, MD;  Location: [facility];  Service: Gynecology;  Laterality: Right;  . DIAGNOSTIC LAPAROSCOPY    . DILATION AND CURETTAGE OF UTERUS    . LAPAROSCOPIC CHOLECYSTECTOMY    . Lynch syndrome    . ROBOTIC ASSISTED TOTAL HYSTERECTOMY WITH BILATERAL SALPINGO OOPHERECTOMY Bilateral 06/26/2019   Procedure: XI ROBOTIC ASSISTED TOTAL HYSTERECTOMY WITH BILATERAL SALPINGO OOPHORECTOMY;  Surgeon: Ward, Elenora Fender, MD;  Location: [facility];  Service: Gynecology;  Laterality: Bilateral;    Family History  Problem Relation Age of Onset  . Malignant hyperthermia Cousin   . Deep vein thrombosis Maternal Grandmother   . Hypertension Mother   . Depression Mother   . COPD Mother   . Ovarian cancer Sister   . Stroke Maternal Grandfather   . Heart attack Maternal Grandfather   . Malignant hyperthermia Other   . Encopresis Son     Social History   Tobacco Use  . Smoking status: Former Smoker    Packs/day: 1.00    Years: 0.00    Pack years: 0.00  . Smokeless tobacco: Never Used  . Tobacco comment: pt stopped at age 60  Vaping Use  . Vaping Use: Never used  Substance Use Topics  . Alcohol use: Yes    Comment: 2 times a year for special occasions  . Drug use: No     Allergies  Allergen Reactions  . Peach [Prunus Persica] Anaphylaxis and Nausea And Vomiting  . Tree Extract Itching     Sneezing and itchy eyes [facility]  Topic Date Due  . Hepatitis C Screening  Never done  . URINE MICROALBUMIN  Never done  . TETANUS/TDAP  07/13/2024  . INFLUENZA VACCINE  Completed  . COVID-19 Vaccine  Completed  . HIV Screening  Completed  . PAP SMEAR-Modifier  Discontinued    Chart Review Today: I personally reviewed active problem list, medication list, allergies, family history, social history, health maintenance, notes from last encounter, lab results, imaging with the patient/caregiver today.   Review of Systems  10 Systems reviewed and are negative for acute change except as noted in the HPI.  Objective:   Vitals:   04/08/20 1059  BP: 118/72  Pulse: 94  Resp: 16  Temp: 98.6  F (37 C)  TempSrc: Oral  SpO2: 97%  Weight: 239 lb 14.4 oz (108.8 kg)  Height: 5\' 4"  (1.626 m)    Body mass index is 41.18 kg/m.  Physical Exam Vitals and nursing note reviewed.  Constitutional:      General: She is not in acute distress.    Appearance: Normal appearance. She is obese. She is not ill-appearing, toxic-appearing or diaphoretic.  HENT:     Head: Normocephalic and atraumatic.  Eyes:     General: No scleral icterus.       Right eye: No discharge.        Left eye: No discharge.     Conjunctiva/sclera: Conjunctivae normal.  Cardiovascular:     Rate and Rhythm: Normal rate and regular rhythm.  Pulmonary:     Effort: No respiratory distress.  Skin:    Coloration: Skin is not jaundiced or pale.  Neurological:     Mental Status: She is alert. Mental status is at baseline.  Psychiatric:        Mood and Affect: Mood normal.        Behavior: Behavior normal.         Assessment & Plan:   1. Class 3 severe obesity with body mass index (BMI) of 40.0 to 44.9 in adult, unspecified obesity type, unspecified whether serious comorbidity present (HCC) saxenda and diet/lifestyle efforts Tolerating meds, intermittent nausea as SE, discussed dose  increase and needed % weight loss to continue meds after 4 month period.  Doing well with weight loss efforts  2. Metabolic syndrome Tx with diet/lifestyle efforts  3. Encounter for medication management Tolerating meds, slowly increasing saxenda dose  4. Need for influenza vaccination - Flu Vaccine QUAD 6+ mos PF IM (Fluarix Quad PF)  F/up 4 months from when she started meds to recheck and document weight loss, f/up sooner if any SE or concerns  , PA-C 04/08/20 11:11 AM

## 2020-04-08 ENCOUNTER — Ambulatory Visit: Payer: BC Managed Care – PPO | Admitting: Family Medicine

## 2020-04-08 ENCOUNTER — Encounter: Payer: Self-pay | Admitting: Family Medicine

## 2020-04-08 ENCOUNTER — Other Ambulatory Visit: Payer: Self-pay

## 2020-04-08 VITALS — BP 118/72 | HR 94 | Temp 98.6°F | Resp 16 | Ht 64.0 in | Wt 239.9 lb

## 2020-04-08 DIAGNOSIS — Z79899 Other long term (current) drug therapy: Secondary | ICD-10-CM | POA: Diagnosis not present

## 2020-04-08 DIAGNOSIS — Z23 Encounter for immunization: Secondary | ICD-10-CM

## 2020-04-08 DIAGNOSIS — Z6841 Body Mass Index (BMI) 40.0 and over, adult: Secondary | ICD-10-CM

## 2020-04-08 DIAGNOSIS — E8881 Metabolic syndrome: Secondary | ICD-10-CM

## 2020-04-19 DIAGNOSIS — D2362 Other benign neoplasm of skin of left upper limb, including shoulder: Secondary | ICD-10-CM | POA: Diagnosis not present

## 2020-04-19 DIAGNOSIS — L821 Other seborrheic keratosis: Secondary | ICD-10-CM | POA: Diagnosis not present

## 2020-04-19 DIAGNOSIS — L723 Sebaceous cyst: Secondary | ICD-10-CM | POA: Diagnosis not present

## 2020-06-05 ENCOUNTER — Inpatient Hospital Stay: Payer: BC Managed Care – PPO

## 2020-06-11 ENCOUNTER — Inpatient Hospital Stay: Payer: BC Managed Care – PPO

## 2020-06-12 ENCOUNTER — Other Ambulatory Visit: Payer: Self-pay

## 2020-06-12 ENCOUNTER — Encounter: Payer: Self-pay | Admitting: Family Medicine

## 2020-06-12 ENCOUNTER — Ambulatory Visit: Payer: BC Managed Care – PPO | Admitting: Family Medicine

## 2020-06-12 VITALS — BP 122/72 | HR 97 | Temp 98.1°F | Resp 16 | Ht 64.0 in | Wt 238.2 lb

## 2020-06-12 DIAGNOSIS — K219 Gastro-esophageal reflux disease without esophagitis: Secondary | ICD-10-CM

## 2020-06-12 DIAGNOSIS — E8881 Metabolic syndrome: Secondary | ICD-10-CM | POA: Diagnosis not present

## 2020-06-12 DIAGNOSIS — Z79899 Other long term (current) drug therapy: Secondary | ICD-10-CM

## 2020-06-12 DIAGNOSIS — Z1509 Genetic susceptibility to other malignant neoplasm: Secondary | ICD-10-CM | POA: Insufficient documentation

## 2020-06-12 DIAGNOSIS — T7800XA Anaphylactic reaction due to unspecified food, initial encounter: Secondary | ICD-10-CM | POA: Insufficient documentation

## 2020-06-12 DIAGNOSIS — E119 Type 2 diabetes mellitus without complications: Secondary | ICD-10-CM | POA: Diagnosis not present

## 2020-06-12 DIAGNOSIS — E785 Hyperlipidemia, unspecified: Secondary | ICD-10-CM

## 2020-06-12 DIAGNOSIS — E039 Hypothyroidism, unspecified: Secondary | ICD-10-CM

## 2020-06-12 DIAGNOSIS — Z6841 Body Mass Index (BMI) 40.0 and over, adult: Secondary | ICD-10-CM

## 2020-06-12 DIAGNOSIS — D509 Iron deficiency anemia, unspecified: Secondary | ICD-10-CM

## 2020-06-12 DIAGNOSIS — Z5181 Encounter for therapeutic drug level monitoring: Secondary | ICD-10-CM

## 2020-06-12 DIAGNOSIS — F329 Major depressive disorder, single episode, unspecified: Secondary | ICD-10-CM

## 2020-06-12 MED ORDER — SAXENDA 18 MG/3ML ~~LOC~~ SOPN: 3.0000 mg | PEN_INJECTOR | Freq: Every day | SUBCUTANEOUS | 1 refills | Status: DC

## 2020-06-12 NOTE — Progress Notes (Signed)
Name: Jaylynne Birkhead   MRN: [id number]    DOB: 11/19/1975   Date:06/12/2020       Progress Note  Chief Complaint  Patient presents with  . Obesity  . Hypothyroidism  . Gastroesophageal Reflux     Subjective:   Kyren Vaux is a 44 y.o. female, presents to clinic for routine f/up   Hypothyroidism: Current Medication Regimen: 100 mcg synthroid Takes medicine in the morning on an empty stomach Current Symptoms: denies fatigue, weight changes, heat/cold intolerance, bowel/skin changes or CVS symptoms Most recent results are below; we will be repeating labs today. Lab Results  Component Value Date   TSH 0.552 03/05/2020    GERD: - Current medication regimen: Pantoprazole 40 mg she is on this long-term, established with GI who is aware of long-term PPI use and is monitoring Her symptoms are severe without the medication currently her symptoms are well controlled is not having any epigastric pain, indigestion, reflux.  She does have a sensitive stomach with different foods and medications so she has changes in her bowel movements but she has not had any symptoms out of the ordinary for her  Obesity and weight management:  On Saxenda currently doing 2.4 mg dose daily  highest weight recorded 269 patient down to 238 today has been on Saxenda injections since about September it was initially not prescribed correctly patient was not able to do a daily dose and she has had side effects when titrating and increasing dose so she has titrated very slowly.   Weight is down more than 7 pounds since starting the medications She is only eating about a 13 to 1400-calorie diet daily eats a variety of foods in all different colors, feels full, is more active and has better energy Wt Readings from Last 15 Encounters:  06/12/20 238 lb 3.2 oz (108 kg)  04/08/20 239 lb 14.4 oz (108.8 kg)  02/13/20 243 lb 3.2 oz (110.3 kg)  12/14/19 252 lb 11.2 oz (114.6 kg)  11/29/19 251 lb 1.6 oz (113.9 kg)  11/10/19  253 lb 6.4 oz (114.9 kg)  10/02/19 252 lb 4.8 oz (114.4 kg)  08/08/19 266 lb (120.7 kg)  07/05/19 266 lb 8 oz (120.9 kg)  06/22/19 269 lb (122 kg)  04/10/19 265 lb 8 oz (120.4 kg)  11/09/18 262 lb (118.8 kg)  09/12/18 248 lb 12.8 oz (112.9 kg)  07/18/18 251 lb (113.9 kg)  01/28/18 257 lb (116.6 kg)   BMI Readings from Last 5 Encounters:  06/12/20 40.89 kg/m  04/08/20 41.18 kg/m  02/13/20 41.75 kg/m  12/14/19 43.38 kg/m  11/29/19 43.10 kg/m   Hemoglobin  Date Value Ref Range Status  03/05/2020 13.2 12.0 - 15.0 g/dL Final  07/15/7251 66.4 (L) 11.7 - 15.5 g/dL Final  40/34/7425 95.6 (L) 11.7 - 15.5 g/dL Final  38/75/6433 29.5 (L) 12.0 - 15.0 g/dL Final  18/84/1660 63.0 (L) 11.1 - 15.9 g/dL Final   HGB  Date Value Ref Range Status  09/18/2013 12.3 12.0 - 16.0 g/dL Final  16/07/930 35.5 12.0 - 16.0 g/dL Final   Iron deficiency anemia-she has follow-up with Dr. Smith Robert She did have iron deficiency anemia following surgery she did a GI follow-up and a colonoscopy was done there was no evidence of blood loss she has consulted with Dr. Smith Robert after several months of attempting oral iron replacement and she had had 1 infusion, last office visit was in August at that time she did not require another infusion and Dr. Smith Robert recommended  repeating labs in 3 to 6 months, will obtain those labs today with her other blood work  HLD -labs reviewed from 2020 Lab Results  Component Value Date   CHOL 215 (H) 11/09/2018   HDL 33 (L) 11/09/2018   LDLCALC 142 (H) 11/09/2018   TRIG 199 (H) 11/09/2018   CHOLHDL 6.5 (H) 11/09/2018  Cholesterol is very elevated she has lost a lot of weight since that time and has not been on medications for cholesterol, reviewed her lipids and ASCVD risk The 10-year ASCVD risk score Denman George DC Jr., et al., 2013) is: 3.5%   Values used to calculate the score:     Age: 40 years     Sex: Female     Is Non-Hispanic African American: No     Diabetic: Yes     Tobacco  smoker: No     Systolic Blood Pressure: 122 mmHg     Is BP treated: No     HDL Cholesterol: 33 mg/dL     Total Cholesterol: 215 mg/dL  Prediabetes: Lab Results  Component Value Date   HGBA1C 5.8 (H) 10/02/2019  Patient denies ever having an A1c above 6.5 and denies ever being diagnosed with type 2 diabetes On Metformin 1000 mg twice daily and Saxenda  Anxiety depression -managed on Trintellix 20 mg =-PHQ 2 was negative today Depression screen [facility] 2/9 06/12/2020 04/08/2020 02/13/2020 11/10/2019 11/10/2019  Decreased Interest 0 1 1 0 0  Down, Depressed, Hopeless 0 2 1 0 0  PHQ - 2 Score 0 3 2 0 0  Altered sleeping - 0 0 0 0  Tired, decreased energy - 0 1 0 0  Change in appetite - 1 0 0 0  Feeling bad or failure about yourself  - 1 1 0 0  Trouble concentrating - 1 0 0 0  Moving slowly or fidgety/restless - 0 0 0 0  Suicidal thoughts - 0 0 0 0  PHQ-9 Score - 6 4 0 0  Difficult doing work/chores - Somewhat difficult Not difficult at all Not difficult at all Not difficult at all       Current Outpatient Medications:  .  aspirin EC 81 MG tablet, Take 81 mg by mouth daily., Disp: , Rfl:  .  cetirizine (ZYRTEC) 10 MG tablet, Take 10 mg by mouth daily. , Disp: , Rfl:  .  ketoconazole (NIZORAL) 2 % shampoo, APPLY 1 APPLICATION TOPICALLY 2 (TWO) TIMES A WEEK., Disp: 120 mL, Rfl: 2 .  levothyroxine (SYNTHROID) 100 MCG tablet, Take 1 tablet (100 mcg total) by mouth daily. Take on empty stomach at least 30 minutes before breakfast, Disp: 90 tablet, Rfl: 3 .  Liraglutide -Weight Management (SAXENDA) 18 MG/3ML SOPN, Inject 0.6 mg into the skin daily. Increase by 0.6mg  q week until max dose of 3mg  is reached. (Patient taking differently: Inject 1.2 mg into the skin daily. Inject 0.6 mg into the skin daily. Increase by 0.6mg  q week until max dose of 3mg  is reached.), Disp: 15 mL, Rfl: 3 .  metFORMIN (GLUCOPHAGE-XR) 500 MG 24 hr tablet, Take 2 tablets (1,000 mg total) by mouth 2 (two) times daily with a  meal., Disp: 360 tablet, Rfl: 3 .  pantoprazole (PROTONIX) 40 MG tablet, Take 1 tablet (40 mg total) by mouth every morning. (Patient taking differently: Take 40 mg by mouth every morning. ), Disp: 90 tablet, Rfl: 1 .  vortioxetine HBr (TRINTELLIX) 20 MG TABS tablet, Take 1 tablet (20 mg total) by mouth daily., Disp:  90 tablet, Rfl: 3 .  EPINEPHrine 0.3 mg/0.3 mL IJ SOAJ injection, Inject 0.3 mLs (0.3 mg total) into the muscle as needed for anaphylaxis. (Patient not taking: Reported on 02/13/2020), Disp: 1 each, Rfl: 0 .  Insulin Pen Needle 29G X MISC, Use as directed with saxenda injections (Patient not taking: Reported on 04/08/2020), Disp: 100 each, Rfl: 1 .  triamcinolone (NASACORT) 55 MCG/ACT AERO nasal inhaler, Place 2 sprays into the nose daily. (Patient not taking: Reported on 04/08/2020), Disp: , Rfl: 12  Patient Active Problem List   Diagnosis Date Noted  . Iron deficiency anemia 12/14/2019  . Hypothyroidism 10/04/2019  . Anemia associated with acute blood loss 10/04/2019  . Microcytosis 10/04/2019  . Seborrheic dermatitis of scalp 10/04/2019  . Class 3 severe obesity with body mass index (BMI) of 40.0 to 44.9 in adult [facility]) 10/04/2019  . Prothrombin gene mutation (HCC) 10/04/2019  . S/P total hysterectomy and bilateral salpingo-oophorectomy 06/26/2019  . Hyperlipidemia 04/10/2019  . Essential hypertension 04/10/2019  . Prediabetes 04/10/2019  . Major depressive disorder with current active episode 04/10/2019  . Anemia 04/10/2019  . Gastroesophageal reflux disease 04/10/2019  . Allergic rhinitis 04/10/2019  . Bilateral polycystic ovarian syndrome 10/20/2012    Past Surgical History:  Procedure Laterality Date  . CESAREAN SECTION    . CESAREAN SECTION N/A 12/17/2014   Procedure: CESAREAN SECTION;  Surgeon: Conard Novak, MD;  Location: [facility] ORS;  Service: Obstetrics;  Laterality: N/A;  . CYSTOSCOPY  06/26/2019   Procedure: CYSTOSCOPY;  Surgeon: Ward, Elenora Fender, MD;   Location: [facility] ORS;  Service: Gynecology;;  . Bluford Kaufmann WITH STENT PLACEMENT Right 06/26/2019   Procedure: CYSTOSCOPY WITH STENT PLACEMENT, Cystotomy;  Surgeon: Ward, Elenora Fender, MD;  Location: [facility] ORS;  Service: Gynecology;  Laterality: Right;  . DIAGNOSTIC LAPAROSCOPY    . DILATION AND CURETTAGE OF UTERUS    . LAPAROSCOPIC CHOLECYSTECTOMY    . Lynch syndrome    . ROBOTIC ASSISTED TOTAL HYSTERECTOMY WITH BILATERAL SALPINGO OOPHERECTOMY Bilateral 06/26/2019   Procedure: XI ROBOTIC ASSISTED TOTAL HYSTERECTOMY WITH BILATERAL SALPINGO OOPHORECTOMY;  Surgeon: Ward, Elenora Fender, MD;  Location: [facility] ORS;  Service: Gynecology;  Laterality: Bilateral;    Family History  Problem Relation Age of Onset  . Malignant hyperthermia Cousin   . Deep vein thrombosis Maternal Grandmother   . Hypertension Mother   . Depression Mother   . COPD Mother   . Ovarian cancer Sister   . Stroke Maternal Grandfather   . Heart attack Maternal Grandfather   . Malignant hyperthermia Other   . Encopresis Son     Social History   Tobacco Use  . Smoking status: Former Smoker    Packs/day: 1.00    Years: 0.00    Pack years: 0.00  . Smokeless tobacco: Never Used  . Tobacco comment: pt stopped at age 58  Vaping Use  . Vaping Use: Never used  Substance Use Topics  . Alcohol use: Yes    Comment: 2 times a year for special occasions  . Drug use: No     Allergies  Allergen Reactions  . Peach [Prunus Persica] Anaphylaxis and Nausea And Vomiting  . Tree Extract Itching    Sneezing and itchy eyes Charletta Cousin and [facility]  Topic Date Due  . Hepatitis C Screening  Never done  . URINE MICROALBUMIN  Never done  . TETANUS/TDAP  07/13/2024  . INFLUENZA VACCINE  Completed  . COVID-19 Vaccine  Completed  .  HIV Screening  Completed  . PAP SMEAR-Modifier  Discontinued    Chart Review Today: I personally reviewed active problem list, medication list, allergies, family history, social history, health  maintenance, notes from last encounter, lab results, imaging with the patient/caregiver today.   Review of Systems  10 Systems reviewed and are negative for acute change except as noted in the HPI.  Objective:   Vitals:   06/12/20 0904  BP: 122/72  Pulse: 97  Resp: 16  Temp: 98.1 F (36.7 C)  TempSrc: Oral  SpO2: 99%  Weight: 238 lb 3.2 oz (108 kg)  Height: 5\' 4"  (1.626 m)    Body mass index is 40.89 kg/m.  Physical Exam Vitals and nursing note reviewed.  Constitutional:      General: She is not in acute distress.    Appearance: Normal appearance. She is well-developed. She is obese. She is not ill-appearing, toxic-appearing or diaphoretic.     Interventions: Face mask in place.  HENT:     Head: Normocephalic and atraumatic.     Right Ear: External ear normal.     Left Ear: External ear normal.  Eyes:     General: Lids are normal. No scleral icterus.       Right eye: No discharge.        Left eye: No discharge.     Conjunctiva/sclera: Conjunctivae normal.  Neck:     Trachea: Phonation normal. No tracheal deviation.  Cardiovascular:     Rate and Rhythm: Normal rate and regular rhythm.     Pulses: Normal pulses.          Radial pulses are 2+ on the right side and 2+ on the left side.       Posterior tibial pulses are 2+ on the right side and 2+ on the left side.     Heart sounds: Normal heart sounds. No murmur heard.  No friction rub. No gallop.   Pulmonary:     Effort: Pulmonary effort is normal. No respiratory distress.     Breath sounds: Normal breath sounds. No stridor. No wheezing, rhonchi or rales.  Chest:     Chest wall: No tenderness.  Abdominal:     General: Bowel sounds are normal. There is no distension.     Palpations: Abdomen is soft.  Musculoskeletal:     Right lower leg: No edema.     Left lower leg: No edema.  Skin:    General: Skin is warm and dry.     Coloration: Skin is not jaundiced or pale.     Findings: No rash.     Comments: No  pallor  Neurological:     Mental Status: She is alert.     Motor: No abnormal muscle tone.     Gait: Gait normal.  Psychiatric:        Mood and Affect: Mood normal.        Speech: Speech normal.        Behavior: Behavior normal.         Assessment & Plan:     ICD-10-CM   1. Class 3 severe obesity with body mass index (BMI) of 40.0 to 44.9 in adult, unspecified obesity type, unspecified whether serious comorbidity present (HCC)  E66.01 CBC with Differential/Platelet   Z68.41 COMPLETE METABOLIC PANEL WITH GFR    Hemoglobin A1c    TSH    Lipid panel    Liraglutide -Weight Management (SAXENDA) 18 MG/3ML SOPN   pt loosing weight losing weight,  since starting Saxenda 252 to 238 - lose more than 14 lbs, more than 5% weight loss, will keep pt on saxenda daily   2. Metabolic syndrome  E88.81 CBC with Differential/Platelet    COMPLETE METABOLIC PANEL WITH GFR    Hemoglobin A1c    TSH    Lipid panel    Liraglutide -Weight Management (SAXENDA) 18 MG/3ML SOPN   Appropriate calorie intake of mostly healthy foods various fruits and vegetables continue diet exercise and medications  3. Encounter for medication management  Z79.899 CBC with Differential/Platelet    COMPLETE METABOLIC PANEL WITH GFR    Hemoglobin A1c    Lipid panel    Liraglutide -Weight Management (SAXENDA) 18 MG/3ML SOPN  4. Iron deficiency anemia, unspecified iron deficiency anemia type  D50.9 CBC with Differential/Platelet    Iron, TIBC and Ferritin Panel   She has consulted with hematology, poor oral iron tolerance, got an infusion, hemoglobin has improved was due for recheck of labs in will follow up with Dr. Smith Robert  5. Hypothyroidism, unspecified type  E03.9 CBC with Differential/Platelet    COMPLETE METABOLIC PANEL WITH GFR    TSH   No symptoms of chemical hyper or hypothyroid, recheck labs with changing weight, adjust dose as needed, some hair loss, but mood/weight/energy good  6. Major depressive disorder with current  active episode, unspecified depression episode severity, unspecified whether recurrent  F32.9    Mood good, PHQ reviewed today and negative, continue Trintellix  7. Hyperlipidemia, unspecified hyperlipidemia type  E78.5 COMPLETE METABOLIC PANEL WITH GFR    Lipid panel   Cholesterol is very elevated about a year and a half ago, patient is low risk, recheck lipids  8. Gastroesophageal reflux disease, unspecified whether esophagitis present  K21.9    Long-term use of PPI, symptoms well controlled with daily Protonix 40 mg, seeing GI regularly  9. Encounter for medication monitoring  Z51.81 CBC with Differential/Platelet    COMPLETE METABOLIC PANEL WITH GFR    Hemoglobin A1c    TSH    Iron, TIBC and Ferritin Panel    Lipid panel    6 month routine f/up  Danelle Berry, PA-C 06/12/20 9:19 AM  Hematology - Dr. Smith Robert Repeat iron, TIBC, ferritin CBC with diff

## 2020-06-13 LAB — COMPLETE METABOLIC PANEL WITH GFR
AG Ratio: 1.6 (calc) (ref 1.0–2.5)
ALT: 49 U/L — ABNORMAL HIGH (ref 6–29)
AST: 31 U/L — ABNORMAL HIGH (ref 10–30)
Albumin: 4.5 g/dL (ref 3.6–5.1)
Alkaline phosphatase (APISO): 91 U/L (ref 31–125)
BUN: 12 mg/dL (ref 7–25)
CO2: 26 mmol/L (ref 20–32)
Calcium: 9.9 mg/dL (ref 8.6–10.2)
Chloride: 104 mmol/L (ref 98–110)
Creat: 0.84 mg/dL (ref 0.50–1.10)
GFR, Est African American: 98 mL/min/{1.73_m2} (ref >=60)
GFR, Est Non African American: 85 mL/min/{1.73_m2} (ref >=60)
Globulin: 2.9 g/dL (calc) (ref 1.9–3.7)
Glucose, Bld: 87 mg/dL (ref 65–99)
Potassium: 4.4 mmol/L (ref 3.5–5.3)
Sodium: 140 mmol/L (ref 135–146)
Total Bilirubin: 0.4 mg/dL (ref 0.2–1.2)
Total Protein: 7.4 g/dL (ref 6.1–8.1)

## 2020-06-13 LAB — CBC WITH DIFFERENTIAL/PLATELET
Absolute Monocytes: 410 cells/uL (ref 200–950)
Basophils Absolute: 70 cells/uL (ref 0–200)
Basophils Relative: 1.1 %
Eosinophils Absolute: 218 cells/uL (ref 15–500)
Eosinophils Relative: 3.4 %
HCT: 42.5 % (ref 35.0–45.0)
Hemoglobin: 14.3 g/dL (ref 11.7–15.5)
Lymphs Abs: 1997 cells/uL (ref 850–3900)
MCH: 27.8 pg (ref 27.0–33.0)
MCHC: 33.6 g/dL (ref 32.0–36.0)
MCV: 82.5 fL (ref 80.0–100.0)
MPV: 11 fL (ref 7.5–12.5)
Monocytes Relative: 6.4 %
Neutro Abs: 3706 cells/uL (ref 1500–7800)
Neutrophils Relative %: 57.9 %
Platelets: 216 10*3/uL (ref 140–400)
RBC: 5.15 10*6/uL — ABNORMAL HIGH (ref 3.80–5.10)
RDW: 13 % (ref 11.0–15.0)
Total Lymphocyte: 31.2 %
WBC: 6.4 10*3/uL (ref 3.8–10.8)

## 2020-06-13 LAB — LIPID PANEL
Cholesterol: 250 mg/dL — ABNORMAL HIGH (ref <200)
HDL: 44 mg/dL — ABNORMAL LOW (ref >=50)
LDL Cholesterol (Calc): 175 mg/dL (calc) — ABNORMAL HIGH
Non-HDL Cholesterol (Calc): 206 mg/dL (calc) — ABNORMAL HIGH (ref <130)
Total CHOL/HDL Ratio: 5.7 (calc) — ABNORMAL HIGH (ref <5.0)
Triglycerides: 159 mg/dL — ABNORMAL HIGH (ref <150)

## 2020-06-13 LAB — HEMOGLOBIN A1C
Hgb A1c MFr Bld: 5.3 % of total Hgb (ref <5.7)
Mean Plasma Glucose: 105 (calc)
eAG (mmol/L): 5.8 (calc)

## 2020-06-13 LAB — IRON,TIBC AND FERRITIN PANEL
%SAT: 20 % (calc) (ref 16–45)
Ferritin: 173 ng/mL (ref 16–232)
Iron: 72 ug/dL (ref 40–190)
TIBC: 367 mcg/dL (calc) (ref 250–450)

## 2020-06-13 LAB — TSH: TSH: 1.88 mIU/L

## 2020-06-14 ENCOUNTER — Inpatient Hospital Stay: Payer: BC Managed Care – PPO | Attending: Oncology

## 2020-06-14 ENCOUNTER — Other Ambulatory Visit: Payer: Self-pay

## 2020-06-14 DIAGNOSIS — D509 Iron deficiency anemia, unspecified: Secondary | ICD-10-CM

## 2020-06-14 LAB — CBC WITH DIFFERENTIAL/PLATELET
Abs Immature Granulocytes: 0.02 10*3/uL (ref 0.00–0.07)
Basophils Absolute: 0.1 10*3/uL (ref 0.0–0.1)
Basophils Relative: 1 %
Eosinophils Absolute: 0.2 10*3/uL (ref 0.0–0.5)
Eosinophils Relative: 3 %
HCT: 39.4 % (ref 36.0–46.0)
Hemoglobin: 13.3 g/dL (ref 12.0–15.0)
Immature Granulocytes: 0 %
Lymphocytes Relative: 31 %
Lymphs Abs: 2.1 10*3/uL (ref 0.7–4.0)
MCH: 28.2 pg (ref 26.0–34.0)
MCHC: 33.8 g/dL (ref 30.0–36.0)
MCV: 83.7 fL (ref 80.0–100.0)
Monocytes Absolute: 0.5 10*3/uL (ref 0.1–1.0)
Monocytes Relative: 7 %
Neutro Abs: 4 10*3/uL (ref 1.7–7.7)
Neutrophils Relative %: 58 %
Platelets: 209 10*3/uL (ref 150–400)
RBC: 4.71 MIL/uL (ref 3.87–5.11)
RDW: 13.1 % (ref 11.5–15.5)
WBC: 7 10*3/uL (ref 4.0–10.5)
nRBC: 0 % (ref 0.0–0.2)

## 2020-06-14 LAB — IRON AND TIBC
Iron: 67 ug/dL (ref 28–170)
Saturation Ratios: 18 % (ref 10.4–31.8)
TIBC: 364 ug/dL (ref 250–450)
UIBC: 297 ug/dL

## 2020-06-14 LAB — FERRITIN: Ferritin: 142 ng/mL (ref 11–307)

## 2020-08-09 ENCOUNTER — Other Ambulatory Visit: Payer: Self-pay | Admitting: Family Medicine

## 2020-08-09 DIAGNOSIS — L219 Seborrheic dermatitis, unspecified: Secondary | ICD-10-CM

## 2020-08-09 DIAGNOSIS — Z20822 Contact with and (suspected) exposure to covid-19: Secondary | ICD-10-CM | POA: Diagnosis not present

## 2020-08-09 MED ORDER — KETOCONAZOLE 2 % EX SHAM: 1.0000 "application " | MEDICATED_SHAMPOO | CUTANEOUS | 2 refills | Status: DC

## 2020-08-09 NOTE — Telephone Encounter (Signed)
Medication Refill - Medication: Ketoconazole   Has the patient contacted their pharmacy? Yes.   Pt states that she did contact pharmacy and they advised her to contact PCP due to no refills. Pt has an appt scheduled for 09/02/20. Please advise.  (Agent: If no, request that the patient contact the pharmacy for the refill.) (Agent: If yes, when and what did the pharmacy advise?)  Preferred Pharmacy (with phone number or street name):  [facility] [phone] Hassell Halim [address]  [address] [place] [address]  Phone: [phone] Fax: [phone]  Hours: Not open 24 hours     Agent: Please be advised that RX refills may take up to 3 business days. We ask that you follow-up with your pharmacy.

## 2020-08-14 ENCOUNTER — Encounter: Payer: Self-pay | Admitting: Family Medicine

## 2020-09-02 ENCOUNTER — Encounter: Payer: Self-pay | Admitting: Family Medicine

## 2020-09-02 ENCOUNTER — Telehealth (INDEPENDENT_AMBULATORY_CARE_PROVIDER_SITE_OTHER): Payer: BLUE CROSS/BLUE SHIELD | Admitting: Family Medicine

## 2020-09-02 VITALS — BP 150/108 | Ht 64.0 in | Wt 238.0 lb

## 2020-09-02 DIAGNOSIS — F40243 Fear of flying: Secondary | ICD-10-CM | POA: Diagnosis not present

## 2020-09-02 DIAGNOSIS — I10 Essential (primary) hypertension: Secondary | ICD-10-CM

## 2020-09-02 DIAGNOSIS — Z0289 Encounter for other administrative examinations: Secondary | ICD-10-CM

## 2020-09-02 DIAGNOSIS — E119 Type 2 diabetes mellitus without complications: Secondary | ICD-10-CM

## 2020-09-02 DIAGNOSIS — H9209 Otalgia, unspecified ear: Secondary | ICD-10-CM | POA: Diagnosis not present

## 2020-09-02 DIAGNOSIS — D6852 Prothrombin gene mutation: Secondary | ICD-10-CM | POA: Diagnosis not present

## 2020-09-02 DIAGNOSIS — Z6841 Body Mass Index (BMI) 40.0 and over, adult: Secondary | ICD-10-CM

## 2020-09-02 DIAGNOSIS — G4733 Obstructive sleep apnea (adult) (pediatric): Secondary | ICD-10-CM

## 2020-09-02 DIAGNOSIS — Z9989 Dependence on other enabling machines and devices: Secondary | ICD-10-CM

## 2020-09-02 DIAGNOSIS — E8881 Metabolic syndrome: Secondary | ICD-10-CM

## 2020-09-02 MED ORDER — LORAZEPAM 0.5 MG PO TABS: 0.2500 mg | ORAL_TABLET | Freq: Every day | ORAL | 0 refills | Status: DC | PRN

## 2020-09-02 MED ORDER — AMOXICILLIN-POT CLAVULANATE 875-125 MG PO TABS: 1.0000 | ORAL_TABLET | Freq: Two times a day (BID) | ORAL | 0 refills | Status: DC

## 2020-09-02 NOTE — Progress Notes (Signed)
Name: Joanna Scott   MRN: [id number]    DOB: 1975-08-01   Date:09/02/2020       Progress Note  Subjective:    Chief Complaint  Chief Complaint  Patient presents with  . Weight Loss    Restart saxenda, since new ins.  . Travel Consult    Pt states is going to be traveling has concerns about post covid ear pain, and possibly needing blood thinner due to hx of blood clots.  Also states needs letter for medically neccessary to bring CPAP machine    I connected with  [facility] on 09/02/20 at  2:00 PM EST by telephone and verified that I am speaking with the correct person using two identifiers.  I discussed the limitations, risks, security and privacy concerns of performing an evaluation and management service by telephone and the availability of in person appointments. Staff also discussed with the patient that there may be a patient responsible charge related to this service. Patient Location: home Provider Location: [facility] Additional Individuals present:none  HPI  Pt has few concerns  Wants to restart saxenda  She had COVID a few weeks ago, sinuses have been congested since then, left ear pain coming and going, hx of ruptured left TM, pain comes and goes  Tried mucinex and atrovent, advil if her head hurts   BP 152/90 - still high for her Her head was hurting so bad this morning   Flying anxiety - used to get xanax in the past Flying on the 1st and then back on the 6th   Needs letter for CPAP for taking on the plane - would like to move supplies to [facility]       Patient Active Problem List   Diagnosis Date Noted  . Lynch syndrome 06/12/2020  . Allergy with anaphylaxis due to food 06/12/2020  . Metabolic syndrome 06/12/2020  . Iron deficiency anemia 12/14/2019  . Hypothyroidism 10/04/2019  . Seborrheic dermatitis of scalp 10/04/2019  . Class 3 severe obesity with body mass index (BMI) of 40.0 to 44.9 in adult [facility]) 10/04/2019  . Prothrombin gene mutation (HCC)  10/04/2019  . S/P total hysterectomy and bilateral salpingo-oophorectomy 06/26/2019  . Hyperlipidemia 04/10/2019  . Essential hypertension 04/10/2019  . Prediabetes 04/10/2019  . Major depressive disorder with current active episode 04/10/2019  . Gastroesophageal reflux disease 04/10/2019  . Allergic rhinitis 04/10/2019    Current Outpatient Medications:  .  aspirin EC 81 MG tablet, Take 81 mg by mouth daily., Disp: , Rfl:  .  cetirizine (ZYRTEC) 10 MG tablet, Take 10 mg by mouth daily. , Disp: , Rfl:  .  EPINEPHrine 0.3 mg/0.3 mL IJ SOAJ injection, Inject 0.3 mLs (0.3 mg total) into the muscle as needed for anaphylaxis., Disp: 1 each, Rfl: 0 .  ketoconazole (NIZORAL) 2 % shampoo, Apply 1 application topically 2 (two) times a week., Disp: 120 mL, Rfl: 2 .  levothyroxine (SYNTHROID) 100 MCG tablet, Take 1 tablet (100 mcg total) by mouth daily. Take on empty stomach at least 30 minutes before breakfast, Disp: 90 tablet, Rfl: 3 .  metFORMIN (GLUCOPHAGE-XR) 500 MG 24 hr tablet, Take 2 tablets (1,000 mg total) by mouth 2 (two) times daily with a meal., Disp: 360 tablet, Rfl: 3 .  pantoprazole (PROTONIX) 40 MG tablet, Take 1 tablet (40 mg total) by mouth every morning., Disp: 90 tablet, Rfl: 1 .  triamcinolone (NASACORT) 55 MCG/ACT AERO nasal inhaler, Place 2 sprays into the nose daily., Disp: , Rfl:  12 .  vortioxetine HBr (TRINTELLIX) 20 MG TABS tablet, Take 1 tablet (20 mg total) by mouth daily., Disp: 90 tablet, Rfl: 3 .  Insulin Pen Needle 29G X MISC, Use as directed with saxenda injections (Patient not taking: Reported on 04/08/2020), Disp: 100 each, Rfl: 1 .  Liraglutide -Weight Management (SAXENDA) 18 MG/3ML SOPN, Inject 3 mg into the skin daily. (Patient not taking: Reported on 09/02/2020), Disp: 45 mL, Rfl: 1 Allergies  Allergen Reactions  . Peach [Prunus Persica] Anaphylaxis and Nausea And Vomiting  . Tree Extract Itching    Sneezing and itchy eyes [name]    Past Surgical  History:  Procedure Laterality Date  . CESAREAN SECTION    . CESAREAN SECTION N/A 12/17/2014   Procedure: CESAREAN SECTION;  Surgeon: Conard Novak, MD;  Location: [facility] ORS;  Service: Obstetrics;  Laterality: N/A;  . CYSTOSCOPY  06/26/2019   Procedure: CYSTOSCOPY;  Surgeon: Ward, Elenora Fender, MD;  Location: [facility] ORS;  Service: Gynecology;;  . Bluford Kaufmann WITH STENT PLACEMENT Right 06/26/2019   Procedure: CYSTOSCOPY WITH STENT PLACEMENT, Cystotomy;  Surgeon: Ward, Elenora Fender, MD;  Location: [facility] ORS;  Service: Gynecology;  Laterality: Right;  . DIAGNOSTIC LAPAROSCOPY    . DILATION AND CURETTAGE OF UTERUS    . LAPAROSCOPIC CHOLECYSTECTOMY    . Lynch syndrome    . ROBOTIC ASSISTED TOTAL HYSTERECTOMY WITH BILATERAL SALPINGO OOPHERECTOMY Bilateral 06/26/2019   Procedure: XI ROBOTIC ASSISTED TOTAL HYSTERECTOMY WITH BILATERAL SALPINGO OOPHORECTOMY;  Surgeon: Ward, Elenora Fender, MD;  Location: [facility] ORS;  Service: Gynecology;  Laterality: Bilateral;   Family History  Problem Relation Age of Onset  . Malignant hyperthermia Cousin   . Deep vein thrombosis Maternal Grandmother   . Hypertension Mother   . Depression Mother   . COPD Mother   . Ovarian cancer Sister   . Stroke Maternal Grandfather   . Heart attack Maternal Grandfather   . Malignant hyperthermia Other   . Encopresis Son    Social History   Socioeconomic History  . Marital status: Married    Spouse name: Chrissie Noa  . Number of children: 2  . Years of education: 63  . Highest education level: Bachelor's degree (e.g., BA, AB, BS)  Occupational History  . Occupation: [employer]  Tobacco Use  . Smoking status: Former Smoker    Packs/day: 1.00    Years: 0.00    Pack years: 0.00  . Smokeless tobacco: Never Used  . Tobacco comment: pt stopped at age 22  Vaping Use  . Vaping Use: Never used  Substance and Sexual Activity  . Alcohol use: Yes    Comment: 2 times a year for special occasions  . Drug use: No  . Sexual activity: Yes     Birth control/protection: None  Other Topics Concern  . Not on file  Social History Narrative  . Not on file   Social Determinants of Health   Financial Resource Strain: Not on file  Food Insecurity: Not on file  Transportation Needs: Not on file  Physical Activity: Not on file  Stress: Not on file  Social Connections: Not on file  Intimate Partner Violence: Not on file    Chart Review Today: I personally reviewed active problem list, medication list, allergies, family history, social history, health maintenance, notes from last encounter, lab results, imaging with the patient/caregiver today.  Review of Systems  Constitutional: Negative.   HENT: Negative.   Eyes: Negative.   Respiratory: Negative.   Cardiovascular: Negative.  Gastrointestinal: Negative.   Endocrine: Negative.   Genitourinary: Negative.   Musculoskeletal: Negative.   Skin: Negative.   Allergic/Immunologic: Negative.   Neurological: Negative.   Hematological: Negative.   Psychiatric/Behavioral: Negative.   All other systems reviewed and are negative.    Objective:    Virtual encounter, vitals limited, only able to obtain the following: Today's Vitals   09/02/20 1213  BP: (!) 150/108  Weight: 238 lb (108 kg)  Height: 5\' 4"  (1.626 m)   Body mass index is 40.85 kg/m. Nursing Note and Vital Signs reviewed.  Physical Exam Vitals and nursing note reviewed.  Neck:     Trachea: Phonation normal.  Neurological:     Mental Status: She is alert.  Psychiatric:        Mood and Affect: Mood normal.     PE limited by telephone encounter   PHQ2/9: Depression screen [facility] 2/9 09/02/2020 06/12/2020 04/08/2020 02/13/2020 11/10/2019  Decreased Interest 1 0 1 1 0  Down, Depressed, Hopeless 2 0 2 1 0  PHQ - 2 Score 3 0 3 2 0  Altered sleeping 0 - 0 0 0  Tired, decreased energy 1 - 0 1 0  Change in appetite 0 - 1 0 0  Feeling bad or failure about yourself  2 - 1 1 0  Trouble concentrating 2 - 1 0 0   Moving slowly or fidgety/restless 0 - 0 0 0  Suicidal thoughts 0 - 0 0 0  PHQ-9 Score 8 - 6 4 0  Difficult doing work/chores Somewhat difficult - Somewhat difficult Not difficult at all Not difficult at all   PHQ-2/9 Result is positive  Fall Risk: Fall Risk  09/02/2020 06/12/2020 04/08/2020 02/13/2020 11/29/2019  Falls in the past year? 0 0 0 0 0  Number falls in past yr: 0 0 0 0 0  Injury with Fall? 0 0 0 0 0  Risk for fall due to : - - - No Fall Risks -  Follow up - Falls evaluation completed Falls evaluation completed Falls evaluation completed -     Assessment and Plan:     ICD-10-CM   1. Essential hypertension  I10    BP elevated, pt is stressed, f/up in clinic for BP recheck  2. Prothrombin gene mutation (HCC)  D68.52    for blood thinners pt encouraged to ask managing specialists  3. Otalgia, unspecified laterality  H92.09 amoxicillin-clavulanate (AUGMENTIN) 875-125 MG tablet   worsning following COVID URI, hx of rupture and infection in the past, abx offered, discussed risk/benefit - offered to check ear other supportive tx discussed  4. Fear of flying  F40.243 LORazepam (ATIVAN) 0.5 MG tablet   discussed low dose po ativan for fear of flying, SE and risks discussed  5. Encounter for completion of form with patient  Z02.89    needs letter for cpap  6. Type 2 diabetes mellitus without complication, without long-term current use of insulin (HCC)  E11.9    refill of saxenda   7. Metabolic syndrome  E88.81   8. Class 3 severe obesity with body mass index (BMI) of 40.0 to 44.9 in adult, unspecified obesity type, unspecified whether serious comorbidity present (HCC)  E66.01    Z68.41   9. OSA on CPAP  G47.33    Z99.89    send letter for flying     I discussed the assessment and treatment plan with the patient. The patient was provided an opportunity to ask questions and all were answered. The  patient agreed with the plan and demonstrated an understanding of the instructions.    The patient was advised to call back or seek an in-person evaluation if the symptoms worsen or if the condition fails to improve as anticipated.  I provided 20+ minutes of non-face-to-face time during this encounter.  Danelle BerryLeisa Isabelle Matt, PA-C 09/02/20 2:24 PM

## 2020-09-03 ENCOUNTER — Inpatient Hospital Stay: Payer: BLUE CROSS/BLUE SHIELD | Attending: Oncology

## 2020-09-03 DIAGNOSIS — E039 Hypothyroidism, unspecified: Secondary | ICD-10-CM | POA: Insufficient documentation

## 2020-09-03 DIAGNOSIS — D509 Iron deficiency anemia, unspecified: Secondary | ICD-10-CM | POA: Insufficient documentation

## 2020-09-03 DIAGNOSIS — F419 Anxiety disorder, unspecified: Secondary | ICD-10-CM | POA: Diagnosis not present

## 2020-09-03 DIAGNOSIS — E282 Polycystic ovarian syndrome: Secondary | ICD-10-CM | POA: Diagnosis not present

## 2020-09-03 DIAGNOSIS — Z87891 Personal history of nicotine dependence: Secondary | ICD-10-CM | POA: Insufficient documentation

## 2020-09-03 DIAGNOSIS — Z7982 Long term (current) use of aspirin: Secondary | ICD-10-CM | POA: Diagnosis not present

## 2020-09-03 DIAGNOSIS — Z1509 Genetic susceptibility to other malignant neoplasm: Secondary | ICD-10-CM | POA: Diagnosis not present

## 2020-09-03 DIAGNOSIS — Z79899 Other long term (current) drug therapy: Secondary | ICD-10-CM | POA: Insufficient documentation

## 2020-09-03 DIAGNOSIS — G4730 Sleep apnea, unspecified: Secondary | ICD-10-CM | POA: Diagnosis not present

## 2020-09-03 DIAGNOSIS — Z90722 Acquired absence of ovaries, bilateral: Secondary | ICD-10-CM | POA: Insufficient documentation

## 2020-09-03 DIAGNOSIS — Z90710 Acquired absence of both cervix and uterus: Secondary | ICD-10-CM | POA: Diagnosis not present

## 2020-09-03 DIAGNOSIS — K219 Gastro-esophageal reflux disease without esophagitis: Secondary | ICD-10-CM | POA: Diagnosis not present

## 2020-09-03 DIAGNOSIS — D6852 Prothrombin gene mutation: Secondary | ICD-10-CM | POA: Diagnosis not present

## 2020-09-03 LAB — CBC WITH DIFFERENTIAL/PLATELET
Abs Immature Granulocytes: 0.04 10*3/uL (ref 0.00–0.07)
Basophils Absolute: 0.1 10*3/uL (ref 0.0–0.1)
Basophils Relative: 1 %
Eosinophils Absolute: 0.2 10*3/uL (ref 0.0–0.5)
Eosinophils Relative: 2 %
HCT: 38.1 % (ref 36.0–46.0)
Hemoglobin: 12.6 g/dL (ref 12.0–15.0)
Immature Granulocytes: 1 %
Lymphocytes Relative: 30 %
Lymphs Abs: 2.7 10*3/uL (ref 0.7–4.0)
MCH: 28.1 pg (ref 26.0–34.0)
MCHC: 33.1 g/dL (ref 30.0–36.0)
MCV: 85 fL (ref 80.0–100.0)
Monocytes Absolute: 0.6 10*3/uL (ref 0.1–1.0)
Monocytes Relative: 7 %
Neutro Abs: 5.3 10*3/uL (ref 1.7–7.7)
Neutrophils Relative %: 59 %
Platelets: 195 10*3/uL (ref 150–400)
RBC: 4.48 MIL/uL (ref 3.87–5.11)
RDW: 13.2 % (ref 11.5–15.5)
WBC: 8.9 10*3/uL (ref 4.0–10.5)
nRBC: 0 % (ref 0.0–0.2)

## 2020-09-03 LAB — IRON AND TIBC
Iron: 61 ug/dL (ref 28–170)
Saturation Ratios: 17 % (ref 10.4–31.8)
TIBC: 356 ug/dL (ref 250–450)
UIBC: 295 ug/dL

## 2020-09-03 LAB — FERRITIN: Ferritin: 122 ng/mL (ref 11–307)

## 2020-09-04 ENCOUNTER — Other Ambulatory Visit: Payer: Self-pay | Admitting: Family Medicine

## 2020-09-04 DIAGNOSIS — E8881 Metabolic syndrome: Secondary | ICD-10-CM

## 2020-09-04 DIAGNOSIS — Z79899 Other long term (current) drug therapy: Secondary | ICD-10-CM

## 2020-09-04 DIAGNOSIS — Z6841 Body Mass Index (BMI) 40.0 and over, adult: Secondary | ICD-10-CM

## 2020-09-05 ENCOUNTER — Inpatient Hospital Stay (HOSPITAL_BASED_OUTPATIENT_CLINIC_OR_DEPARTMENT_OTHER): Payer: BLUE CROSS/BLUE SHIELD | Admitting: Oncology

## 2020-09-05 ENCOUNTER — Telehealth: Payer: Self-pay | Admitting: Oncology

## 2020-09-05 ENCOUNTER — Encounter: Payer: Self-pay | Admitting: Oncology

## 2020-09-05 DIAGNOSIS — D509 Iron deficiency anemia, unspecified: Secondary | ICD-10-CM | POA: Diagnosis not present

## 2020-09-05 NOTE — Progress Notes (Signed)
I connected with Joanna Scott on 09/05/20 at  2:00 PM EST by video enabled telemedicine visit and verified that I am speaking with the correct person using two identifiers.   I discussed the limitations, risks, security and privacy concerns of performing an evaluation and management service by telemedicine and the availability of in-person appointments. I also discussed with the patient that there may be a patient responsible charge related to this service. The patient expressed understanding and agreed to proceed.  Other persons participating in the visit and their role in the encounter:  none  Patient's location:  home Provider's location:  work  Stage manager Complaint: Routine follow-up of iron deficiency anemia  History of present illness: Patient is a 45 year old female referred to [place] for iron deficiency anemia.  She has a history of Lynch syndrome and has regular follow-up with GI, GYN and dermatology. Her recent CBC from 11/10/2019 showed white count of 8.8, H&H of 10.9/35.3 with an MCV of 71 and a platelet count of 274. Iron study showed a low ferritin of 17 and low iron saturation of 6% and elevated TIBC of 461. Looking back at her prior CBCs patient has had chronic microcytic anemia and over the last 6 months her hemoglobin has been around 10. TSH in March 2021 was normal. Patient hasd tried oral iron but unable to tolerate due to constipation. She is s/p hysterectomy. Patient sees [facility] and has had EGD and colonoscopy in feb 2021 which did not show any bleeding. Noted to have multiple gastric polyps which were consistent with fundic gland polyps. No evidence of dysplasia  Patient is heterozygous for prothrombin gene mutation. She developed portal venous thrombosis in 2006. She was on birth control at that time. She was on anticoagulation for about 8 months then stopped. She takes prophylactic anticoagulation prior to flight trips. She has not had any thromboembolic episodes since. Prior to  portal venous thrombosis she has gone through C sections without any complications.  Interval history : Patient reports doing well overall.  Denies any significant fatigue at this time.  Denies any blood loss in her stool or urine.   Review of Systems  Constitutional: Negative for chills, fever, malaise/fatigue and weight loss.  HENT: Negative for congestion, ear discharge and nosebleeds.   Eyes: Negative for blurred vision.  Respiratory: Negative for cough, hemoptysis, sputum production, shortness of breath and wheezing.   Cardiovascular: Negative for chest pain, palpitations, orthopnea and claudication.  Gastrointestinal: Negative for abdominal pain, blood in stool, constipation, diarrhea, heartburn, melena, nausea and vomiting.  Genitourinary: Negative for dysuria, flank pain, frequency, hematuria and urgency.  Musculoskeletal: Negative for back pain, joint pain and myalgias.  Skin: Negative for rash.  Neurological: Negative for dizziness, tingling, focal weakness, seizures, weakness and headaches.  Endo/Heme/Allergies: Does not bruise/bleed easily.  Psychiatric/Behavioral: Negative for depression and suicidal ideas. The patient does not have insomnia.     Allergies  Allergen Reactions  . Peach [Prunus Persica] Anaphylaxis and Nausea And Vomiting  . Tree Extract Itching    Sneezing and itchy eyes [name]    Past Medical History:  Diagnosis Date  . Anemia   . Anemia 04/10/2019  . Anxiety   . Bilateral polycystic ovarian syndrome 10/20/2012  . Family history of adverse reaction to anesthesia    malignant hyperthermia in 2 first cousins   . Family history of malignant hyperthermia   . GERD (gastroesophageal reflux disease)   . Hypothyroidism   . Microcytosis 10/04/2019  . Obesity  affecting pregnancy   . PCOS (polycystic ovarian syndrome)   . Portal vein thrombosis   . Postpartum care following cesarean delivery 12/19/2014  . Preeclampsia in postpartum period   .  Pregnancy   . Previous cesarean delivery, delivered 12/16/2014  . Previous cesarean section   . Prothrombin gene mutation (HCC)   . S/P cesarean section 12/17/2014  . Scalp psoriasis   . Sleep apnea     Past Surgical History:  Procedure Laterality Date  . CESAREAN SECTION    . CESAREAN SECTION N/A 12/17/2014   Procedure: CESAREAN SECTION;  Surgeon: Conard Novak, MD;  Location: [facility] ORS;  Service: Obstetrics;  Laterality: N/A;  . CYSTOSCOPY  06/26/2019   Procedure: CYSTOSCOPY;  Surgeon: Ward, Elenora Fender, MD;  Location: [facility] ORS;  Service: Gynecology;;  . Bluford Kaufmann WITH STENT PLACEMENT Right 06/26/2019   Procedure: CYSTOSCOPY WITH STENT PLACEMENT, Cystotomy;  Surgeon: Ward, Elenora Fender, MD;  Location: [facility] ORS;  Service: Gynecology;  Laterality: Right;  . DIAGNOSTIC LAPAROSCOPY    . DILATION AND CURETTAGE OF UTERUS    . LAPAROSCOPIC CHOLECYSTECTOMY    . Lynch syndrome    . ROBOTIC ASSISTED TOTAL HYSTERECTOMY WITH BILATERAL SALPINGO OOPHERECTOMY Bilateral 06/26/2019   Procedure: XI ROBOTIC ASSISTED TOTAL HYSTERECTOMY WITH BILATERAL SALPINGO OOPHORECTOMY;  Surgeon: Ward, Elenora Fender, MD;  Location: [facility] ORS;  Service: Gynecology;  Laterality: Bilateral;    Social History   Socioeconomic History  . Marital status: Married    Spouse name: Chrissie Noa  . Number of children: 2  . Years of education: 69  . Highest education level: Bachelor's degree (e.g., BA, AB, BS)  Occupational History  . Occupation: [employer]  Tobacco Use  . Smoking status: Former Smoker    Packs/day: 1.00    Years: 0.00    Pack years: 0.00  . Smokeless tobacco: Never Used  . Tobacco comment: pt stopped at age 53  Vaping Use  . Vaping Use: Never used  Substance and Sexual Activity  . Alcohol use: Yes    Comment: 2 times a year for special occasions  . Drug use: No  . Sexual activity: Yes    Birth control/protection: None  Other Topics Concern  . Not on file  Social History Narrative  . Not on file   Social  Determinants of Health   Financial Resource Strain: Not on file  Food Insecurity: Not on file  Transportation Needs: Not on file  Physical Activity: Not on file  Stress: Not on file  Social Connections: Not on file  Intimate Partner Violence: Not on file    Family History  Problem Relation Age of Onset  . Malignant hyperthermia Cousin   . Deep vein thrombosis Maternal Grandmother   . Hypertension Mother   . Depression Mother   . COPD Mother   . Ovarian cancer Sister   . Stroke Maternal Grandfather   . Heart attack Maternal Grandfather   . Malignant hyperthermia Other   . Encopresis Son      Current Outpatient Medications:  .  amoxicillin-clavulanate (AUGMENTIN) 875-125 MG tablet, Take 1 tablet by mouth 2 (two) times daily., Disp: 20 tablet, Rfl: 0 .  aspirin EC 81 MG tablet, Take 81 mg by mouth daily., Disp: , Rfl:  .  cetirizine (ZYRTEC) 10 MG tablet, Take 10 mg by mouth daily. , Disp: , Rfl:  .  EPINEPHrine 0.3 mg/0.3 mL IJ SOAJ injection, Inject 0.3 mLs (0.3 mg total) into the muscle as needed for anaphylaxis., Disp: 1 each,  Rfl: 0 .  ketoconazole (NIZORAL) 2 % shampoo, Apply 1 application topically 2 (two) times a week., Disp: 120 mL, Rfl: 2 .  LORazepam (ATIVAN) 0.5 MG tablet, Take 0.5-1 tablets (0.25-0.5 mg total) by mouth daily as needed for anxiety (severe fear of flying)., Disp: 8 tablet, Rfl: 0 .  pantoprazole (PROTONIX) 40 MG tablet, Take 1 tablet (40 mg total) by mouth every morning., Disp: 90 tablet, Rfl: 1 .  triamcinolone (NASACORT) 55 MCG/ACT AERO nasal inhaler, Place 2 sprays into the nose daily., Disp: , Rfl: 12 .  vortioxetine HBr (TRINTELLIX) 20 MG TABS tablet, Take 1 tablet (20 mg total) by mouth daily., Disp: 90 tablet, Rfl: 3 .  Insulin Pen Needle 29G X MISC, Use as directed with saxenda injections (Patient not taking: No sig reported), Disp: 100 each, Rfl: 1 .  Liraglutide -Weight Management (SAXENDA) 18 MG/3ML SOPN, Inject 3 mg into the skin daily.  (Patient not taking: No sig reported), Disp: 45 mL, Rfl: 1 .  metFORMIN (GLUCOPHAGE-XR) 500 MG 24 hr tablet, Take 2 tablets (1,000 mg total) by mouth 2 (two) times daily with a meal. (Patient not taking: Reported on 09/05/2020), Disp: 360 tablet, Rfl: 3  No results found.  No images are attached to the encounter.   CMP Latest Ref Rng & Units 06/12/2020  Glucose 65 - 99 mg/dL 87  BUN 7 - 25 mg/dL 12  Creatinine 8.54 - 6.27 mg/dL 0.35  Sodium 009 - 381 mmol/L 140  Potassium 3.5 - 5.3 mmol/L 4.4  Chloride 98 - 110 mmol/L 104  CO2 20 - 32 mmol/L 26  Calcium 8.6 - 10.2 mg/dL 9.9  Total Protein 6.1 - 8.1 g/dL 7.4  Total Bilirubin 0.2 - 1.2 mg/dL 0.4  Alkaline Phos 39 - 117 IU/L -  AST 10 - 30 U/L 31(H)  ALT 6 - 29 U/L 49(H)   CBC Latest Ref Rng & Units 09/03/2020  WBC 4.0 - 10.5 K/uL 8.9  Hemoglobin 12.0 - 15.0 g/dL 82.9  Hematocrit 93.7 - 46.0 % 38.1  Platelets 150 - 400 K/uL 195     Observation/objective: Appears in no acute distress over video visit today.  Breathing is nonlabored  Assessment and plan: Patient is a 45 year old female with history of iron deficiency anemia here for routine follow-up  Patient is not presently anemic with an H&H of 12.6/38.1.  Iron studies are within normal limits.  She last received IV iron in June 2021.  Given the stability of her hemoglobin and iron studies.We will repeat CBC ferritin iron studies in 4 and 8 months and I will see her back in 8 months.  Patient had a history of focal venous thrombosis back in 2006 when she was on birth control.  She has an upcoming 4 to 5-hour flight and she does not require any Lovenox prophylaxis for this   Follow-up instructions:as above  I discussed the assessment and treatment plan with the patient. The patient was provided an opportunity to ask questions and all were answered. The patient agreed with the plan and demonstrated an understanding of the instructions.   The patient was advised to call back or  seek an in-person evaluation if the symptoms worsen or if the condition fails to improve as anticipated.    Visit Diagnosis: 1. Iron deficiency anemia, unspecified iron deficiency anemia type     Dr. Owens Shark, MD, MPH [facility] Tel- [phone] 09/05/2020 3:13 PM

## 2020-09-05 NOTE — Telephone Encounter (Signed)
Spoke with pt to confirm future appts after today's MyChart visit. Pt confirmed dates and times and is agreeable to another MyChart visit for her 8 month f/u.

## 2020-09-08 ENCOUNTER — Other Ambulatory Visit: Payer: Self-pay | Admitting: Family Medicine

## 2020-09-11 ENCOUNTER — Other Ambulatory Visit: Payer: Self-pay | Admitting: Family Medicine

## 2020-09-12 ENCOUNTER — Other Ambulatory Visit: Payer: Self-pay | Admitting: Family Medicine

## 2020-09-12 DIAGNOSIS — Z6841 Body Mass Index (BMI) 40.0 and over, adult: Secondary | ICD-10-CM

## 2020-09-12 DIAGNOSIS — E8881 Metabolic syndrome: Secondary | ICD-10-CM

## 2020-09-12 DIAGNOSIS — Z79899 Other long term (current) drug therapy: Secondary | ICD-10-CM

## 2020-09-17 ENCOUNTER — Encounter: Payer: Self-pay | Admitting: Family Medicine

## 2020-09-18 ENCOUNTER — Other Ambulatory Visit: Payer: Self-pay | Admitting: Family Medicine

## 2020-09-18 DIAGNOSIS — E8881 Metabolic syndrome: Secondary | ICD-10-CM

## 2020-09-18 DIAGNOSIS — Z6841 Body Mass Index (BMI) 40.0 and over, adult: Secondary | ICD-10-CM

## 2020-09-18 DIAGNOSIS — Z79899 Other long term (current) drug therapy: Secondary | ICD-10-CM

## 2020-09-18 NOTE — Telephone Encounter (Signed)
Please advise. Will she need to be seen to discuss the Trintellex  or can this be changed. Cannot do a PA on Saxenda due to patient has not given new insurance information. Stated she will send card today to Mychart

## 2020-09-18 NOTE — Telephone Encounter (Signed)
  Notes to clinic:  Alternative Requested   Requested Prescriptions  Pending Prescriptions Disp Refills   SAXENDA 18 MG/3ML SOPN [Pharmacy Med Name: SAXENDA 18 MG/3 ML PEN]  0    Sig: INJECT 3 MG INTO THE SKIN DAILY.      Endocrinology:  Diabetes - GLP-1 Receptor Agonists Passed - 09/18/2020  8:14 AM      Passed - HBA1C is between 0 and 7.9 and within 180 days    Hgb A1c MFr Bld  Date Value Ref Range Status  06/12/2020 5.3 <5.7 % of total Hgb Final    Comment:    For the purpose of screening for the presence of diabetes: . <5.7%       Consistent with the absence of diabetes 5.7-6.4%    Consistent with increased risk for diabetes             (prediabetes) > or =6.5%  Consistent with diabetes . This assay result is consistent with a decreased risk of diabetes. . Currently, no consensus exists regarding use of hemoglobin A1c for diagnosis of diabetes in children. . According to American Diabetes Association (ADA) guidelines, hemoglobin A1c <7.0% represents optimal control in non-pregnant diabetic patients. Different metrics may apply to specific patient populations.  Standards of Medical Care in Diabetes(ADA). Verna Czech - Valid encounter within last 6 months    Recent Outpatient Visits           2 weeks ago    [facility] Danelle Berry, PA-C   3 months ago Class 3 severe obesity with body mass index (BMI) of 40.0 to 44.9 in adult, unspecified obesity type, unspecified whether serious comorbidity present [facility])   [facility] Danelle Berry, PA-C   5 months ago Class 3 severe obesity with body mass index (BMI) of 40.0 to 44.9 in adult, unspecified obesity type, unspecified whether serious comorbidity present [facility])   [facility] Danelle Berry, PA-C   7 months ago Encounter for medication management   [facility] Danelle Berry, PA-C   9 months ago Class 3 severe obesity with body mass index  (BMI) of 40.0 to 44.9 in adult, unspecified obesity type, unspecified whether serious comorbidity present [facility])   [facility] Danelle Berry, PA-C       Future Appointments             In 2 months Danelle Berry, PA-C [facility]   In 7 months Creig Hines, [facility]

## 2020-09-23 DIAGNOSIS — G4733 Obstructive sleep apnea (adult) (pediatric): Secondary | ICD-10-CM | POA: Insufficient documentation

## 2020-09-23 DIAGNOSIS — E119 Type 2 diabetes mellitus without complications: Secondary | ICD-10-CM | POA: Insufficient documentation

## 2020-09-23 DIAGNOSIS — Z9989 Dependence on other enabling machines and devices: Secondary | ICD-10-CM | POA: Insufficient documentation

## 2020-09-27 ENCOUNTER — Other Ambulatory Visit: Payer: Self-pay | Admitting: Family Medicine

## 2020-10-01 ENCOUNTER — Telehealth: Payer: Self-pay

## 2020-10-01 NOTE — Telephone Encounter (Signed)
Copied from CRM [id number]. Topic: General - Other >> Oct 01, 2020 11:02 AM Marylen Ponto wrote: Reason for CRM: Pt returned call to office regarding a medication. Pt requests call back. Cb# [phone]

## 2020-10-01 NOTE — Telephone Encounter (Signed)
Pt states she has been on this for years, its not a change and is working well?

## 2020-10-02 NOTE — Telephone Encounter (Signed)
Called pharmacy, they state insurance will no longer cover and it will cost her $300+ and those are the alternatives to prescribe

## 2020-10-03 NOTE — Telephone Encounter (Signed)
Tried contacting pt to ask her to schedule an appt.

## 2020-10-03 NOTE — Telephone Encounter (Signed)
I see in you note she needs to schedule appointment. Please schedule her with whomever available

## 2020-10-09 ENCOUNTER — Telehealth: Payer: Self-pay | Admitting: Family Medicine

## 2020-10-09 NOTE — Telephone Encounter (Signed)
Patient is calling to request a new script for her medication, TRINTELLIX 20 MG TABS tablet.  She stated the pharmacy does not carry this and would need a new script.  Please advise and call patient to discuss.  Patient stated she only has two pills left.  CB# [phone]

## 2020-10-10 NOTE — Telephone Encounter (Signed)
Pt called she ended up paying out of pocket for med. She states ins will no longer cover trintellex.  Joanna Scott has a appt schedule and will discuss then other options.  In meantime advised pt to call ins to see what they will cover and have available at appt.

## 2020-11-04 ENCOUNTER — Encounter: Payer: Self-pay | Admitting: Family Medicine

## 2020-11-04 ENCOUNTER — Ambulatory Visit
Admission: RE | Admit: 2020-11-04 | Discharge: 2020-11-04 | Disposition: A | Discharge status: Home / Self Care | Payer: BC Managed Care – PPO | Attending: Family Medicine | Admitting: Family Medicine

## 2020-11-04 ENCOUNTER — Ambulatory Visit
Admission: RE | Admit: 2020-11-04 | Discharge: 2020-11-04 | Disposition: A | Discharge status: Home / Self Care | Payer: BC Managed Care – PPO | Source: Ambulatory Visit | Attending: Family Medicine | Admitting: Family Medicine

## 2020-11-04 ENCOUNTER — Ambulatory Visit (INDEPENDENT_AMBULATORY_CARE_PROVIDER_SITE_OTHER): Payer: BLUE CROSS/BLUE SHIELD | Admitting: Family Medicine

## 2020-11-04 ENCOUNTER — Other Ambulatory Visit: Payer: Self-pay

## 2020-11-04 VITALS — BP 122/84 | HR 98 | Temp 98.8°F | Resp 16 | Ht 64.0 in | Wt 255.2 lb

## 2020-11-04 DIAGNOSIS — R7303 Prediabetes: Secondary | ICD-10-CM

## 2020-11-04 DIAGNOSIS — M5442 Lumbago with sciatica, left side: Secondary | ICD-10-CM

## 2020-11-04 DIAGNOSIS — F329 Major depressive disorder, single episode, unspecified: Secondary | ICD-10-CM | POA: Diagnosis not present

## 2020-11-04 DIAGNOSIS — E8881 Metabolic syndrome: Secondary | ICD-10-CM | POA: Diagnosis not present

## 2020-11-04 DIAGNOSIS — E66813 Obesity, class 3: Secondary | ICD-10-CM

## 2020-11-04 DIAGNOSIS — M549 Dorsalgia, unspecified: Secondary | ICD-10-CM | POA: Diagnosis not present

## 2020-11-04 DIAGNOSIS — Z6841 Body Mass Index (BMI) 40.0 and over, adult: Secondary | ICD-10-CM

## 2020-11-04 DIAGNOSIS — Z79899 Other long term (current) drug therapy: Secondary | ICD-10-CM

## 2020-11-04 DIAGNOSIS — Z1509 Genetic susceptibility to other malignant neoplasm: Secondary | ICD-10-CM

## 2020-11-04 DIAGNOSIS — M545 Low back pain, unspecified: Secondary | ICD-10-CM | POA: Diagnosis not present

## 2020-11-04 DIAGNOSIS — S299XXA Unspecified injury of thorax, initial encounter: Secondary | ICD-10-CM | POA: Diagnosis not present

## 2020-11-04 DIAGNOSIS — R6882 Decreased libido: Secondary | ICD-10-CM

## 2020-11-04 DIAGNOSIS — R1032 Left lower quadrant pain: Secondary | ICD-10-CM

## 2020-11-04 MED ORDER — SAXENDA 18 MG/3ML ~~LOC~~ SOPN
3.0000 mg | PEN_INJECTOR | Freq: Every day | SUBCUTANEOUS | 0 refills | Status: DC
Start: 2020-11-04 — End: 2020-12-24

## 2020-11-04 MED ORDER — TIZANIDINE HCL 4 MG PO TABS: 2.0000 mg | ORAL_TABLET | Freq: Three times a day (TID) | ORAL | 2 refills | Status: DC | PRN

## 2020-11-04 MED ORDER — PREDNISONE 20 MG PO TABS: 40.0000 mg | ORAL_TABLET | Freq: Every day | ORAL | 0 refills | Status: AC

## 2020-11-04 NOTE — Progress Notes (Signed)
Patient ID: Joanna DelayHeidi Scott, female    DOB: 09/05/1975, 45 y.o.   MRN: [id number]  PCP: Danelle Berryapia, Lamoine Magallon, PA-C  Chief Complaint  Patient presents with  . Follow-up  . Depression    Ins does not want to pay for trintellex  . Obesity    Subjective:   Joanna Scott is a 45 y.o. female, presents to clinic with CC of the following:  HPI  Pt has had difficulty with insurance and refills of her normal meds - particularly Vortioxetine/trintellix 20 mg tab - which she has been on for years,  Tried lexapro, zoloft, paxil, this was done more than 5+ years ago with psychiatry then taken over by prior PCP's [facility] 11/04/2020 09/02/2020 06/12/2020  Decreased Interest 1 1 0  Down, Depressed, Hopeless 1 2 0  PHQ - 2 Score 2 3 0  Altered sleeping 0 0 -  Tired, decreased energy 1 1 -  Change in appetite 1 0 -  Feeling bad or failure about yourself  0 2 -  Trouble concentrating 3 2 -  Moving slowly or fidgety/restless 0 0 -  Suicidal thoughts 0 0 -  PHQ-9 Score 7 8 -  Difficult doing work/chores Somewhat difficult Somewhat difficult -   GAD 7 : Generalized Anxiety Score 11/04/2020 02/13/2020  Nervous, Anxious, on Edge 1 0  Control/stop worrying 2 0  Worry too much - different things 1 0  Trouble relaxing 1 0  Restless 0 0  Easily annoyed or irritable 2 0  Afraid - awful might happen 2 0  Total GAD 7 Score 9 0  Anxiety Difficulty Somewhat difficult Not difficult at all  She paid over $400 for the last Rx when insurance wouldn't pain it, going to run out soon.   LLQ pain - nagging, intermittent, she is anxious there may be something wrong with hx of colon CA in family and lynch syndrome - bowels constantly change - but no significant change from her normal BM variance.  In the last year she had TAH-BSO with several complications including bleeding, damage to ureter with repair, she has not had any urinary sx.  She follows with OBGYN, GI llq abd pain ache waking her up in the am pain  rated 5/10 Colonoscopy in 2 months No change in bowels from normal abnormal with saxenda    Multiple other concerns - decreased libido - Ward - OBGYN  GYN-Onc   T2DM, HLD, HTN obesity - managed on saxenda and metformin  Back pain   GERD - on protonix     Patient Active Problem List   Diagnosis Date Noted  . OSA on CPAP 09/23/2020  . Type 2 diabetes mellitus without complication, without long-term current use of insulin (HCC) 09/23/2020  . Lynch syndrome 06/12/2020  . Allergy with anaphylaxis due to food 06/12/2020  . Metabolic syndrome 06/12/2020  . Iron deficiency anemia 12/14/2019  . Hypothyroidism 10/04/2019  . Seborrheic dermatitis of scalp 10/04/2019  . Class 3 severe obesity with body mass index (BMI) of 40.0 to 44.9 in adult [facility](HCC) 10/04/2019  . Prothrombin gene mutation (HCC) 10/04/2019  . S/P total hysterectomy and bilateral salpingo-oophorectomy 06/26/2019  . Hyperlipidemia 04/10/2019  . Essential hypertension 04/10/2019  . Prediabetes 04/10/2019  . Major depressive disorder with current active episode 04/10/2019  . Gastroesophageal reflux disease 04/10/2019  . Allergic rhinitis 04/10/2019      Current Outpatient Medications:  .  aspirin EC 81 MG tablet, Take 81 mg by mouth daily.,  Disp: , Rfl:  .  cetirizine (ZYRTEC) 10 MG tablet, Take 10 mg by mouth daily. , Disp: , Rfl:  .  EPINEPHrine 0.3 mg/0.3 mL IJ SOAJ injection, Inject 0.3 mLs (0.3 mg total) into the muscle as needed for anaphylaxis., Disp: 1 each, Rfl: 0 .  Insulin Pen Needle 29G X MISC, Use as directed with saxenda injections, Disp: 100 each, Rfl: 1 .  ketoconazole (NIZORAL) 2 % shampoo, Apply 1 application topically 2 (two) times a week., Disp: 120 mL, Rfl: 2 .  metFORMIN (GLUCOPHAGE-XR) 500 MG 24 hr tablet, Take 2 tablets (1,000 mg total) by mouth 2 (two) times daily with a meal., Disp: 360 tablet, Rfl: 3 .  pantoprazole (PROTONIX) 40 MG tablet, TAKE 1 TABLET BY MOUTH EVERY DAY IN THE MORNING,  Disp: 90 tablet, Rfl: 1 .  SAXENDA 18 MG/3ML SOPN, INJECT 3 MG INTO THE SKIN DAILY., Disp: 45 mL, Rfl: 0 .  TRINTELLIX 20 MG TABS tablet, TAKE 1 TABLET BY MOUTH EVERY DAY, Disp: 90 tablet, Rfl: 0   Allergies  Allergen Reactions  . Peach [Prunus Persica] Anaphylaxis and Nausea And Vomiting  . Tree Extract Itching    Sneezing and itchy eyes [place] Cous[employer] [place]     Social History   Tobacco Use  . Smoking status: Former Smoker    Packs/day: 1.00    Years: 0.00    Pack years: 0.00  . Smokeless tobacco: Never Used  . Tobacco comment: pt stopped at age 76  Vaping Use  . Vaping Use: Never used  Substance Use Topics  . Alcohol use: Yes    Comment: 2 times a year for special occasions  . Drug use: No      Chart Review Today: I personally reviewed active problem list, medication list, allergies, family history, social history, health maintenance, notes from last encounter, lab results, imaging with the patient/caregiver today.   Review of Systems  Constitutional: Negative.  Negative for chills, diaphoresis, fatigue and fever.  HENT: Negative.   Eyes: Negative.   Respiratory: Negative.  Negative for shortness of breath.   Cardiovascular: Negative.  Negative for chest pain.  Gastrointestinal: Positive for abdominal pain. Negative for anal bleeding, blood in stool, constipation, diarrhea, nausea, rectal pain and vomiting.  Endocrine: Negative.   Genitourinary: Positive for flank pain. Negative for decreased urine volume, difficulty urinating, dysuria, frequency, hematuria, menstrual problem, pelvic pain, urgency, vaginal bleeding, vaginal discharge and vaginal pain.  Skin: Negative.   Allergic/Immunologic: Negative.   Neurological: Negative.   Hematological: Negative.   Psychiatric/Behavioral: Negative.   All other systems reviewed and are negative.      Objective:   Vitals:   11/04/20 1515  BP: 122/84  Pulse: 98  Resp: 16  Temp: 98.8 F (37.1 C)  SpO2: 98%  Weight: 255 lb  3.2 oz (115.8 kg)  Height: 5\' 4"  (1.626 m)    Body mass index is 43.8 kg/m.  Physical Exam Vitals and nursing note reviewed.  Constitutional:      General: She is not in acute distress.    Appearance: Normal appearance. She is well-developed. She is not ill-appearing, toxic-appearing or diaphoretic.     Interventions: Face mask in place.  HENT:     Head: Normocephalic and atraumatic.     Right Ear: External ear normal.     Left Ear: External ear normal.  Eyes:     General: Lids are normal. No scleral icterus.       Right eye: No discharge.  Left eye: No discharge.     Conjunctiva/sclera: Conjunctivae normal.  Neck:     Trachea: Phonation normal. No tracheal deviation.  Cardiovascular:     Rate and Rhythm: Normal rate and regular rhythm.     Pulses: Normal pulses.          Radial pulses are 2+ on the right side and 2+ on the left side.       Posterior tibial pulses are 2+ on the right side and 2+ on the left side.     Heart sounds: Normal heart sounds. No murmur heard. No friction rub. No gallop.   Pulmonary:     Effort: Pulmonary effort is normal. No respiratory distress.     Breath sounds: Normal breath sounds. No stridor. No wheezing, rhonchi or rales.  Chest:     Chest wall: No tenderness.  Abdominal:     General: Abdomen is protuberant. Bowel sounds are normal. There is no distension.     Palpations: Abdomen is soft. There is no mass.     Tenderness: There is abdominal tenderness in the left lower quadrant. There is left CVA tenderness. There is no right CVA tenderness, guarding or rebound. Negative signs include McBurney's sign.  Musculoskeletal:        General: Tenderness present.     Cervical back: Normal.     Thoracic back: Tenderness present. No swelling, edema, deformity, signs of trauma or spasms. No scoliosis.     Lumbar back: No bony tenderness. Positive left straight leg raise test.     Right lower leg: No edema.     Left lower leg: No edema.      Comments: 5/5 strength bilaterally with dorsiflexion, plantarflexion, flexion and extension at knees, and flexion and extension at hips Grossly normal sensation to light touch to bilateral lower extremities antalgic gait   Skin:    General: Skin is warm and dry.     Coloration: Skin is not jaundiced or pale.     Findings: No rash.  Neurological:     Mental Status: She is alert.     Motor: No abnormal muscle tone.     Gait: Gait normal.  Psychiatric:        Mood and Affect: Mood normal.        Speech: Speech normal.        Behavior: Behavior normal.      Results for orders placed or performed in visit on 09/03/20  Ferritin  Result Value Ref Range   Ferritin 122 11 - 307 ng/mL  CBC with Differential  Result Value Ref Range   WBC 8.9 4.0 - 10.5 K/uL   RBC 4.48 3.87 - 5.11 MIL/uL   Hemoglobin 12.6 12.0 - 15.0 g/dL   HCT 51.7 61.6 - 07.3 %   MCV 85.0 80.0 - 100.0 fL   MCH 28.1 26.0 - 34.0 pg   MCHC 33.1 30.0 - 36.0 g/dL   RDW 71.0 62.6 - 94.8 %   Platelets 195 150 - 400 K/uL   nRBC 0.0 0.0 - 0.2 %   Neutrophils Relative % 59 %   Neutro Abs 5.3 1.7 - 7.7 K/uL   Lymphocytes Relative 30 %   Lymphs Abs 2.7 0.7 - 4.0 K/uL   Monocytes Relative 7 %   Monocytes Absolute 0.6 0.1 - 1.0 K/uL   Eosinophils Relative 2 %   Eosinophils Absolute 0.2 0.0 - 0.5 K/uL   Basophils Relative 1 %   Basophils Absolute 0.1 0.0 - 0.1  K/uL   Immature Granulocytes 1 %   Abs Immature Granulocytes 0.04 0.00 - 0.07 K/uL  Iron and TIBC  Result Value Ref Range   Iron 61 28 - 170 ug/dL   TIBC 633 354 - 562 ug/dL   Saturation Ratios 17 10.4 - 31.8 %   UIBC 295 ug/dL       Assessment & Plan:      ICD-10-CM   1. Major depressive disorder with current active episode, unspecified depression episode severity, unspecified whether recurrent  F32.9    resubmit Rx for her meds and do PA again, may need to do peer-to-peer or request past med records  2. Class 3 severe obesity with body mass index (BMI) of  40.0 to 44.9 in adult, unspecified obesity type, unspecified whether serious comorbidity present (HCC)  E66.01 Liraglutide -Weight Management (SAXENDA) 18 MG/3ML SOPN   Z68.41    pt loosing weight losing weight, since starting Saxenda 252 to 238 - lose more than 14 lbs, more than 5% weight loss, will keep pt on saxenda daily   3. Metabolic syndrome  E88.81 Liraglutide -Weight Management (SAXENDA) 18 MG/3ML SOPN   Appropriate calorie intake of mostly healthy foods various fruits and vegetables continue diet exercise and medications  4. Prediabetes  R73.03    She is tolerating Metformin 1500 mg but taking in 1 dose in the morning encouraged her to divide doses and take with food  5. Obesity, Class III, BMI 40-49.9 (morbid obesity) (HCC)  E66.01    Some decrease in her weight encouraged healthy diet and exercise when able she is continuing to try Metformin for prediabetes  6. Acute left-sided back pain, unspecified back location  M54.9 DG Lumbar Spine Complete    Ambulatory referral to Physical Therapy    tiZANidine (ZANAFLEX) 4 MG tablet    predniSONE (DELTASONE) 20 MG tablet    CANCELED: DG Thoracic Spine 4V   radicular sx, tx with steroid burst, recommended heat tx, muscle relaxers prn, PT eval and tx strongly advised  7. Left lower quadrant abdominal pain  R10.32 Urinalysis, Routine w reflex microscopic    CT Abdomen Pelvis W Contrast   lynch syndrome, family hx of ovarian cancer, Left lower quadrant pain recent TAH-BSO, ureter injury, internal bleeding complications  8. Acute left-sided low back pain with left-sided sciatica  M54.42 Ambulatory referral to Physical Therapy    tiZANidine (ZANAFLEX) 4 MG tablet    predniSONE (DELTASONE) 20 MG tablet    CT Abdomen Pelvis W Contrast    DG Thoracic Spine 2 View  9. Lynch syndrome  Z15.09   10. Encounter for medication management  Z79.899 Liraglutide -Weight Management (SAXENDA) 18 MG/3ML SOPN  11. Decreased libido  R68.82    encouraged her to  see GYN for discussion about this and hormonal changes and options for tx after TAHBSO   LLQ abd pain, r/o UTI, no pain on exam today, no bowel changes with recent surgical hx, surgical and post op complications, LYNCH syndrome and family hx she is concerned that 3 months of achy pain may be cancer - may try to get CT?  She has GI f/up in 2 months   Redo PA on trintellix - for unipolar depression, started med at least 5 years ago with psychiatry after trying and failing multiple meds including paxil, zoloft and lexapro  May try prozac or effexor if insurance continues to deny, will try peer to peer    Danelle Berry, PA-C 11/04/20 3:17 PM

## 2020-11-05 LAB — URINALYSIS, ROUTINE W REFLEX MICROSCOPIC
Bilirubin Urine: NEGATIVE
Glucose, UA: NEGATIVE
Hgb urine dipstick: NEGATIVE
Ketones, ur: NEGATIVE
Leukocytes,Ua: NEGATIVE
Nitrite: NEGATIVE
Protein, ur: NEGATIVE
Specific Gravity, Urine: 1.02 (ref 1.001–1.035)
pH: <=5 (ref 5.0–8.0)

## 2020-11-06 ENCOUNTER — Encounter: Payer: Self-pay | Admitting: Family Medicine

## 2020-11-06 ENCOUNTER — Other Ambulatory Visit: Payer: Self-pay | Admitting: Family Medicine

## 2020-11-07 ENCOUNTER — Other Ambulatory Visit: Payer: Self-pay

## 2020-11-07 ENCOUNTER — Ambulatory Visit: Payer: BC Managed Care – PPO | Attending: Family Medicine

## 2020-11-07 DIAGNOSIS — M545 Low back pain, unspecified: Secondary | ICD-10-CM | POA: Diagnosis not present

## 2020-11-07 DIAGNOSIS — M6281 Muscle weakness (generalized): Secondary | ICD-10-CM | POA: Diagnosis not present

## 2020-11-07 NOTE — Therapy (Signed)
[facility] PHYSICAL AND SPORTS MEDICINE [address] [place], [place], [address] Phone: [phone]   Fax:  [phone]  Physical Therapy Evaluation  Patient Details  Name: Joanna DelayHeidi Mintzer MRN: [id number] Date of Birth: March 05, 1976 Referring Provider (PT): Danelle Berryapia, [place], [place]   Encounter Date: 11/07/2020   PT End of Session - 11/07/20 0849    Visit Number 1    Number of Visits 17    Date for PT Re-Evaluation 01/02/21    PT Start Time 0811    PT Stop Time 0849    PT Time Calculation (min) 38 min    Activity Tolerance Patient tolerated treatment well    Behavior [facility] for tasks assessed/performed           Past Medical History:  Diagnosis Date  . Anemia   . Anemia 04/10/2019  . Anxiety   . Bilateral polycystic ovarian syndrome 10/20/2012  . Family history of adverse reaction to anesthesia    malignant hyperthermia in 2 first cousins   . Family history of malignant hyperthermia   . GERD (gastroesophageal reflux disease)   . Hypothyroidism   . Microcytosis 10/04/2019  . Obesity affecting pregnancy   . PCOS (polycystic ovarian syndrome)   . Portal vein thrombosis   . Postpartum care following cesarean delivery 12/19/2014  . Preeclampsia in postpartum period   . Pregnancy   . Previous cesarean delivery, delivered 12/16/2014  . Previous cesarean section   . Prothrombin gene mutation (HCC)   . S/P cesarean section 12/17/2014  . Scalp psoriasis   . Sleep apnea     Past Surgical History:  Procedure Laterality Date  . CESAREAN SECTION    . CESAREAN SECTION N/A 12/17/2014   Procedure: CESAREAN SECTION;  Surgeon: Conard NovakStephen D Jackson, MD;  Location: [facility] ORS;  Service: Obstetrics;  Laterality: N/A;  . CYSTOSCOPY  06/26/2019   Procedure: CYSTOSCOPY;  Surgeon: Ward, Elenora Fenderhelsea C, MD;  Location: [facility] ORS;  Service: Gynecology;;  . Bluford KaufmannYSTOSCOPY WITH STENT PLACEMENT Right 06/26/2019   Procedure: CYSTOSCOPY WITH STENT PLACEMENT, Cystotomy;  Surgeon:  Ward, Elenora Fenderhelsea C, MD;  Location: [facility] ORS;  Service: Gynecology;  Laterality: Right;  . DIAGNOSTIC LAPAROSCOPY    . DILATION AND CURETTAGE OF UTERUS    . LAPAROSCOPIC CHOLECYSTECTOMY    . Lynch syndrome    . ROBOTIC ASSISTED TOTAL HYSTERECTOMY WITH BILATERAL SALPINGO OOPHERECTOMY Bilateral 06/26/2019   Procedure: XI ROBOTIC ASSISTED TOTAL HYSTERECTOMY WITH BILATERAL SALPINGO OOPHORECTOMY;  Surgeon: Ward, Elenora Fenderhelsea C, MD;  Location: [facility] ORS;  Service: Gynecology;  Laterality: Bilateral;    There were no vitals filed for this visit.    Subjective Assessment - 11/07/20 0813    Subjective L mid to low back pain: 2/10 currently (pt sitting and leaning to the R, 7/10 at most for the past 1.5 weeks.    Pertinent History L mid to low back pain. Pain began 10/26/2020. P t was picking up a laundry basket and did not realize there was a 12 lb item in it. Pt picked up the laundry basket and twisted to the R. Pain is located L thoracic and lumbar and posterior lateral hip to L proximal posterior thigh. Denies loss of bowel or bladder control. Denies LE paresthesia.    Patient Stated Goals Better posture.    Currently in Pain? Yes    Pain Score 2     Pain Location Back    Pain Orientation Left;Posterior;Mid;Lower    Pain Descriptors / Indicators Burning;Tightness  Pain Type Acute pain    Pain Radiating Towards L posterior proximal thigh    Pain Onset 1 to 4 weeks ago    Pain Frequency Occasional    Aggravating Factors  Lifting up L LE, bending over, walking up hill (picking up L LE), Sitting    Pain Relieving Factors Leaning to the R, walking, standing up, lying on her back with a heating pad, prednisone              [facility] PT Assessment - 11/07/20 0820      Assessment   Medical Diagnosis M54.42 (ICD-10-CM) - Acute left-sided low back pain with left-sided sciatica    Referring Provider (PT) Danelle Berry, PA-C    Onset Date/Surgical Date 11/04/20      Precautions   Precaution Comments no  known precautions      Restrictions   Other Position/Activity Restrictions no known restrictions      Prior Function   Vocation Full time employment   Realtor     Observation/Other Assessments   Focus on Therapeutic Outcomes (FOTO)  Lumbar FOTO 15      Posture/Postural Control   Posture Comments Protracted neck, B protracted shoulders, R lateral lean, L trunk rotation, B pronated feet.      AROM   Lumbar Flexion WFL with L low back pain    Lumbar Extension WFL ( L lumbar rotation observed)    Lumbar - Right Side [facility] with L low back pain    Lumbar - Left Side [facility] with L low back pain (pain > R lumbar side bend pain)    Lumbar - Right Rotation WFL    Lumbar - Left Rotation WFL with L low back pain      Strength   Right Hip Flexion 4/5    Right Hip Extension 4-/5    Right Hip ABduction 4/5    Left Hip Flexion 3-/5    Left Hip Extension 3+/5    Left Hip ABduction 4-/5   with L posterior hip soreness   Right Knee Flexion 4+/5    Right Knee Extension 5/5    Left Knee Flexion 4-/5   L low back pain   Left Knee Extension 4/5      Ambulation/Gait   Gait Comments slight decreased stance R LE                      Objective measurements completed on examination: See above findings.      No latex band allergies  Decreased low back pain with R lateral shift correct.   Slump test: (+) L with reproduction of symptoms. (+) Long sit test suggesting anterior nutation of L innominate  Medbridge Access Code [email or web address]  Therapeutic exercise  Prone on elbows 2 min Seated hip extension isometrics  L 10x5 seconds   Standing R lateral shift correction 10x5 seconds for 2 sets  Reviewed and given as part of her HEP. Pt demonstrated and verbalized understanding. Handout provided.      Improved exercise technique, movement at target joints, use of target muscles after mod verbal, visual, tactile cues.   Response to treatment Pt tolerated session well without  aggravation of symptoms   Clinical impression Patient is a 45 year old female who came to physical therapy secondary to L mid to low back pain. She also presents with altered posture, bilateral hip weakness, reproduction of symptoms with lumbar AROM, L LE neural tension, positive special test suggesting lumbopelvic  involvement, and difficulty performing functional tasks such as lifting, bending over, walking up a hill, lifting her L leg, and tolerating positions such as sitting. Pt will benefit from skilled physical therapy services to address the aforementioned deficits.                        PT Education - 11/07/20 1321    Education Details ther-ex, HEP, plan of care    Person(s) Educated Patient    Methods Explanation;Demonstration;Tactile cues;Verbal cues;Handout    Comprehension Returned demonstration;Verbalized understanding            PT Short Term Goals - 11/07/20 1328      PT SHORT TERM GOAL #1   Title Pt will be independent with her initial HEP to decrease pain, improve strength, and ability to perform tasks which involve bending over as well as lifting her L LE more comfortably.    Baseline Pt has started her HEP. (11/07/2020)    Time 3    Period Weeks    Status New    Target Date 11/28/20             PT Long Term Goals - 11/07/20 1330      PT LONG TERM GOAL #1   Title Patient will have a decrease in back pain to 1/10 or less at worst to promote ability to perform tasks which involve bending over as well as lifting her L LE more comfortably.    Baseline 7/10 back pain at most for the past 1.5 weeks (11/07/2020)    Time 8    Period Weeks    Status New    Target Date 01/02/21      PT LONG TERM GOAL #2   Title Patient will improve bilateral hip extension and abduction strength by at least 1/2 MMT grade to promote ability to lift her L LE up more comfortably.    Baseline Hip extension 4-/5 R, 3+/5 L, hip abduction 4/5 R, 4-/5 L (11/07/2020)    Time  8    Period Weeks    Status New    Target Date 01/02/21      PT LONG TERM GOAL #3   Title Pt will improve her lumbar FOTO score by at least 10 points as a demonstration of improved function.    Baseline Lumbar spine FOTO 15 (11/07/2020)    Time 8    Period Weeks    Status New    Target Date 01/02/21                  Plan - 11/07/20 1322    Clinical Impression Statement Patient is a 45 year old female who came to physical therapy secondary to L mid to low back pain. She also presents with altered posture, bilateral hip weakness, reproduction of symptoms with lumbar AROM, L LE neural tension, positive special test suggesting lumbopelvic involvement, and difficulty performing functional tasks such as lifting, bending over, walking up a hill, lifting her L leg, and tolerating positions such as sitting. Pt will benefit from skilled physical therapy services to address the aforementioned deficits.    Personal Factors and Comorbidities Comorbidity 2;Fitness;Profession    Comorbidities Anxiety, hx of C-section    Examination-Activity Limitations Bend;Sit;Lift;Squat;Carry;Stairs    Stability/Clinical Decision Making Stable/Uncomplicated    Clinical Decision Making Low    Rehab Potential Fair    PT Frequency 2x / week    PT Duration 8 weeks    PT  Treatment/Interventions Therapeutic activities;Therapeutic exercise;Functional mobility training;Neuromuscular re-education;Patient/family education;Manual techniques;Dry needling;Spinal Manipulations;Joint Manipulations;Aquatic Therapy;Biofeedback;Electrical Stimulation;Iontophoresis 4mg /ml Dexamethasone;Traction    PT Next Visit Plan Posture, extension, trunk and hip strengthening, thoracic extension, scapular strengthening, manual techniques, modalities PRN    PT Home Exercise Plan Medbridge Access Code [email or web address]    Consulted and Agree with Plan of Care Patient           Patient will benefit from skilled therapeutic intervention in order to  improve the following deficits and impairments:  Pain,Postural dysfunction,Improper body mechanics,Decreased strength  Visit Diagnosis: Acute left-sided low back pain, unspecified whether sciatica present - Plan: PT plan of care cert/re-cert  Muscle weakness (generalized) - Plan: PT plan of care cert/re-cert     Problem List Patient Active Problem List   Diagnosis Date Noted  . OSA on CPAP 09/23/2020  . Type 2 diabetes mellitus without complication, without long-term current use of insulin (HCC) 09/23/2020  . Lynch syndrome 06/12/2020  . Allergy with anaphylaxis due to food 06/12/2020  . Metabolic syndrome 06/12/2020  . Iron deficiency anemia 12/14/2019  . Hypothyroidism 10/04/2019  . Seborrheic dermatitis of scalp 10/04/2019  . Class 3 severe obesity with body mass index (BMI) of 40.0 to 44.9 in adult [facility]) 10/04/2019  . Prothrombin gene mutation (HCC) 10/04/2019  . S/P total hysterectomy and bilateral salpingo-oophorectomy 06/26/2019  . Hyperlipidemia 04/10/2019  . Essential hypertension 04/10/2019  . Prediabetes 04/10/2019  . Major depressive disorder with current active episode 04/10/2019  . Gastroesophageal reflux disease 04/10/2019  . Allergic rhinitis 04/10/2019    04/12/2019 PT, DPT   11/07/2020, 1:50 PM  [facility] PHYSICAL AND SPORTS MEDICINE [address], [place] Phone: [phone]   Fax:  [phone]  Name: Maryelizabeth Eberle MRN: Joanna Scott Date of Birth: 1975/08/29

## 2020-11-07 NOTE — Patient Instructions (Addendum)
Seated hip extension isometrics   Sitting on a chair,    Squeeze your rear end muscles together and press your L foot onto the floor.     Hold for 5 seconds    Repeat 10 times   Perform 3 sets daily.      This is a corrective exercise. Once you no longer have symptoms, you can stop.    Standing R lateral shift correction 10x5 seconds for 3 sets  Reviewed and given as part of her HEP. Pt demonstrated and verbalized understanding. Handout provided.    Access Code: [email or web address] URL: [email or web address] Date: 11/07/2020 Prepared by: Loralyn Freshwater  Exercises Prone on Elbows Stretch - 3 x daily - 7 x weekly - 1 sets - 2-3 reps - 2 minutes hold

## 2020-11-11 ENCOUNTER — Other Ambulatory Visit: Payer: Self-pay

## 2020-11-11 ENCOUNTER — Ambulatory Visit: Payer: BC Managed Care – PPO | Attending: Family Medicine

## 2020-11-11 DIAGNOSIS — M545 Low back pain, unspecified: Secondary | ICD-10-CM | POA: Insufficient documentation

## 2020-11-11 DIAGNOSIS — M6281 Muscle weakness (generalized): Secondary | ICD-10-CM | POA: Insufficient documentation

## 2020-11-11 NOTE — Therapy (Signed)
[facility] PH[facility]ORTS MEDICINE [address], [place], [address] Phone: [phone]   Fax:  [phone]  Physical Therapy Treatment  Patient Details  Name: Joanna Scott MRN: [id number] Date of Birth: 1976-01-20 Referring Provider (PT): Danelle Berry, PA-C   Encounter Date: 11/11/2020   PT End of Session - 11/11/20 0848    Visit Number 2    Number of Visits 17    Date for PT Re-Evaluation 01/02/21    PT Start Time 0849    PT Stop Time 0930    PT Time Calculation (min) 41 min    Activity Tolerance Patient tolerated treatment well    Behavior During Therapy [facility] for tasks assessed/performed           Past Medical History:  Diagnosis Date  . Anemia   . Anemia 04/10/2019  . Anxiety   . Bilateral polycystic ovarian syndrome 10/20/2012  . Family history of adverse reaction to anesthesia    malignant hyperthermia in 2 first cousins   . Family history of malignant hyperthermia   . GERD (gastroesophageal reflux disease)   . Hypothyroidism   . Microcytosis 10/04/2019  . Obesity affecting pregnancy   . PCOS (polycystic ovarian syndrome)   . Portal vein thrombosis   . Postpartum care following cesarean delivery 12/19/2014  . Preeclampsia in postpartum period   . Pregnancy   . Previous cesarean delivery, delivered 12/16/2014  . Previous cesarean section   . Prothrombin gene mutation (HCC)   . S/P cesarean section 12/17/2014  . Scalp psoriasis   . Sleep apnea     Past Surgical History:  Procedure Laterality Date  . CESAREAN SECTION    . CESAREAN SECTION N/A 12/17/2014   Procedure: CESAREAN SECTION;  Surgeon: Conard Novak, MD;  Location: [facility];  Service: Obstetrics;  Laterality: N/A;  . CYSTOSCOPY  06/26/2019   Procedure: CYSTOSCOPY;  Surgeon: Ward, Elenora Fender, MD;  Location: [facility];  Service: Gynecology;;  . Bluford Kaufmann WITH STENT PLACEMENT Right 06/26/2019   Procedure: CYSTOSCOPY WITH STENT PLACEMENT, Cystotomy;  Surgeon:  Ward, Elenora Fender, MD;  Location: [facility];  Service: Gynecology;  Laterality: Right;  . DIAGNOSTIC LAPAROSCOPY    . DILATION AND CURETTAGE OF UTERUS    . LAPAROSCOPIC CHOLECYSTECTOMY    . Lynch syndrome    . ROBOTIC ASSISTED TOTAL HYSTERECTOMY WITH BILATERAL SALPINGO OOPHERECTOMY Bilateral 06/26/2019   Procedure: XI ROBOTIC ASSISTED TOTAL HYSTERECTOMY WITH BILATERAL SALPINGO OOPHORECTOMY;  Surgeon: Ward, Elenora Fender, MD;  Location: [facility];  Service: Gynecology;  Laterality: Bilateral;    There were no vitals filed for this visit.   Subjective Assessment - 11/11/20 0851    Subjective L back is doing pretty good. No pain currently. To the point now where it does not hurt unless she tries to twist or bend. Can put on her shoes. Was a little sore the next day after last session.    Pertinent History L mid to low back pain. Pain began 10/26/2020. P t was picking up a laundry basket and did not realize there was a 12 lb item in it. Pt picked up the laundry basket and twisted to the R. Pain is located L thoracic and lumbar and posterior lateral hip to L proximal posterior thigh. Denies loss of bowel or bladder control. Denies LE paresthesia.    Patient Stated Goals Better posture.    Currently in Pain? No/denies    Pain Score 0-No pain    Pain Onset  1 to 4 weeks ago                                     PT Education - 11/11/20 0853    Education Details ther-ex    Person(s) Educated Patient    Methods Explanation;Demonstration;Tactile cues;Verbal cues    Comprehension Returned demonstration;Verbalized understanding          Objective    No latex band allergies  Decreased low back pain with R lateral shift correct.   Slump test: (+) L with reproduction of symptoms. (+) Long sit test suggesting anterior nutation of L innominate  Medbridge Access Code [email or web address]  Therapeutic exercise  Standing R lateral shift correction 10x5 seconds   Seated manually  resisted L lateral shift isometrics, PT manual resistance at neutral position. 10x5 seconds for 3 sets  Seated thoracic extension isometrics 10x3 with 5 second holds   Bridge 10x3   Prone press-ups 10x5 seconds for 3 sets.   Prone glute max extension 10x2 L LE   Static standing lunge L LE with one UE assist 10x3  Standing B shoulder extension red band 10x2 with 5 second holds                   Improved exercise technique, movement at target joints, use of target muscles after mod verbal, visual, tactile cues.   Response to treatment Pt tolerated session well without aggravation of symptoms   Clinical impression Improved low back and L LE pain based on subjective reports. Continued working on improving posture and trunk and glute strength to help decrease stress to low back and LE nerves. Pt tolerated session well without aggravation of symptoms. Pt will benefit from continued skilled physical therapy services to decrease pain, improve strength and function.        PT Short Term Goals - 11/07/20 1328      PT SHORT TERM GOAL #1   Title Pt will be independent with her initial HEP to decrease pain, improve strength, and ability to perform tasks which involve bending over as well as lifting her L LE more comfortably.    Baseline Pt has started her HEP. (11/07/2020)    Time 3    Period Weeks    Status New    Target Date 11/28/20             PT Long Term Goals - 11/07/20 1330      PT LONG TERM GOAL #1   Title Patient will have a decrease in back pain to 1/10 or less at worst to promote ability to perform tasks which involve bending over as well as lifting her L LE more comfortably.    Baseline 7/10 back pain at most for the past 1.5 weeks (11/07/2020)    Time 8    Period Weeks    Status New    Target Date 01/02/21      PT LONG TERM GOAL #2   Title Patient will improve bilateral hip extension and abduction strength by at least 1/2 MMT grade to promote ability to  lift her L LE up more comfortably.    Baseline Hip extension 4-/5 R, 3+/5 L, hip abduction 4/5 R, 4-/5 L (11/07/2020)    Time 8    Period Weeks    Status New    Target Date 01/02/21      PT LONG TERM GOAL #3   Title  Pt will improve her lumbar FOTO score by at least 10 points as a demonstration of improved function.    Baseline Lumbar spine FOTO 15 (11/07/2020)    Time 8    Period Weeks    Status New    Target Date 01/02/21                 Plan - 11/11/20 0923    Clinical Impression Statement Improved low back and L LE pain based on subjective reports. Continued working on improving posture and trunk and glute strength to help decrease stress to low back and LE nerves. Pt tolerated session well without aggravation of symptoms. Pt will benefit from continued skilled physical therapy services to decrease pain, improve strength and function.    Personal Factors and Comorbidities Comorbidity 2;Fitness;Profession    Comorbidities Anxiety, hx of C-section    Examination-Activity Limitations Bend;Sit;Lift;Squat;Carry;Stairs    Stability/Clinical Decision Making Stable/Uncomplicated    Rehab Potential Fair    PT Frequency 2x / week    PT Duration 8 weeks    PT Treatment/Interventions Therapeutic activities;Therapeutic exercise;Functional mobility training;Neuromuscular re-education;Patient/family education;Manual techniques;Dry needling;Spinal Manipulations;Joint Manipulations;Aquatic Therapy;Biofeedback;Electrical Stimulation;Iontophoresis 4mg /ml Dexamethasone;Traction    PT Next Visit Plan Posture, extension, trunk and hip strengthening, thoracic extension, scapular strengthening, manual techniques, modalities PRN    PT Home Exercise Plan Medbridge Access Code [email or web address]    Consulted and Agree with Plan of Care Patient           Patient will benefit from skilled therapeutic intervention in order to improve the following deficits and impairments:  Pain,Postural dysfunction,Improper body  mechanics,Decreased strength  Visit Diagnosis: Acute left-sided low back pain, unspecified whether sciatica present  Muscle weakness (generalized)     Problem List Patient Active Problem List   Diagnosis Date Noted  . OSA on CPAP 09/23/2020  . Type 2 diabetes mellitus without complication, without long-term current use of insulin (HCC) 09/23/2020  . Lynch syndrome 06/12/2020  . Allergy with anaphylaxis due to food 06/12/2020  . Metabolic syndrome 06/12/2020  . Iron deficiency anemia 12/14/2019  . Hypothyroidism 10/04/2019  . Seborrheic dermatitis of scalp 10/04/2019  . Class 3 severe obesity with body mass index (BMI) of 40.0 to 44.9 in adult [facility]) 10/04/2019  . Prothrombin gene mutation (HCC) 10/04/2019  . S/P total hysterectomy and bilateral salpingo-oophorectomy 06/26/2019  . Hyperlipidemia 04/10/2019  . Essential hypertension 04/10/2019  . Prediabetes 04/10/2019  . Major depressive disorder with current active episode 04/10/2019  . Gastroesophageal reflux disease 04/10/2019  . Allergic rhinitis 04/10/2019    04/12/2019 PT, DPT   11/11/2020, 9:32 AM  [facility] PHYSICAL AND SPORTS MEDICINE [address], [place] Phone: [phone]   Fax:  [phone]  Name: Joanna Scott MRN: [name] Date of Birth: 1976-06-05

## 2020-11-11 NOTE — Patient Instructions (Addendum)
  Access Code: [email or web address] URL: [email or web address] Date: 11/11/2020 Prepared by: Loralyn Freshwater  Exercises Prone on Elbows Stretch - 3 x daily - 7 x weekly - 1 sets - 2-3 reps - 2 minutes hold Seated Thoracic Lumbar Extension - 1 x daily - 7 x weekly - 3 sets - 10 reps - 5 seconds hold Supine Bridge - 1 x daily - 7 x weekly - 3 sets - 10 reps Prone Press Up - 1 x daily - 7 x weekly - 3 sets - 10 reps - 5 seconds hold

## 2020-11-13 ENCOUNTER — Other Ambulatory Visit: Payer: Self-pay

## 2020-11-13 ENCOUNTER — Ambulatory Visit: Payer: BC Managed Care – PPO

## 2020-11-13 DIAGNOSIS — M6281 Muscle weakness (generalized): Secondary | ICD-10-CM

## 2020-11-13 DIAGNOSIS — M545 Low back pain, unspecified: Secondary | ICD-10-CM

## 2020-11-13 NOTE — Therapy (Signed)
[facility] PHYS[facility] SPORTS MEDICINE [address], [place], [address] Phone: [phone]   Fax:  [phone]  Physical Therapy Treatment  Patient Details  Name: Joanna Scott MRN: [id number] Date of Birth: 1975/08/27 Referring Provider (PT): Danelle Berry, PA-C   Encounter Date: 11/13/2020   PT End of Session - 11/13/20 0848    Visit Number 3    Number of Visits 17    Date for PT Re-Evaluation 01/02/21    PT Start Time 0848    PT Stop Time 0929    PT Time Calculation (min) 41 min    Activity Tolerance Patient tolerated treatment well    Behavior During Therapy [facility] for tasks assessed/performed           Past Medical History:  Diagnosis Date  . Anemia   . Anemia 04/10/2019  . Anxiety   . Bilateral polycystic ovarian syndrome 10/20/2012  . Family history of adverse reaction to anesthesia    malignant hyperthermia in 2 first cousins   . Family history of malignant hyperthermia   . GERD (gastroesophageal reflux disease)   . Hypothyroidism   . Microcytosis 10/04/2019  . Obesity affecting pregnancy   . PCOS (polycystic ovarian syndrome)   . Portal vein thrombosis   . Postpartum care following cesarean delivery 12/19/2014  . Preeclampsia in postpartum period   . Pregnancy   . Previous cesarean delivery, delivered 12/16/2014  . Previous cesarean section   . Prothrombin gene mutation (HCC)   . S/P cesarean section 12/17/2014  . Scalp psoriasis   . Sleep apnea     Past Surgical History:  Procedure Laterality Date  . CESAREAN SECTION    . CESAREAN SECTION N/A 12/17/2014   Procedure: CESAREAN SECTION;  Surgeon: Conard Novak, MD;  Location: [facility];  Service: Obstetrics;  Laterality: N/A;  . CYSTOSCOPY  06/26/2019   Procedure: CYSTOSCOPY;  Surgeon: Ward, Elenora Fender, MD;  Location: [facility];  Service: Gynecology;;  . Bluford Kaufmann WITH STENT PLACEMENT Right 06/26/2019   Procedure: CYSTOSCOPY WITH STENT PLACEMENT, Cystotomy;  Surgeon:  Ward, Elenora Fender, MD;  Location: [facility];  Service: Gynecology;  Laterality: Right;  . DIAGNOSTIC LAPAROSCOPY    . DILATION AND CURETTAGE OF UTERUS    . LAPAROSCOPIC CHOLECYSTECTOMY    . Lynch syndrome    . ROBOTIC ASSISTED TOTAL HYSTERECTOMY WITH BILATERAL SALPINGO OOPHERECTOMY Bilateral 06/26/2019   Procedure: XI ROBOTIC ASSISTED TOTAL HYSTERECTOMY WITH BILATERAL SALPINGO OOPHORECTOMY;  Surgeon: Ward, Elenora Fender, MD;  Location: [facility];  Service: Gynecology;  Laterality: Bilateral;    There were no vitals filed for this visit.   Subjective Assessment - 11/13/20 0851    Subjective No back pain.    Pertinent History L mid to low back pain. Pain began 10/26/2020. P t was picking up a laundry basket and did not realize there was a 12 lb item in it. Pt picked up the laundry basket and twisted to the R. Pain is located L thoracic and lumbar and posterior lateral hip to L proximal posterior thigh. Denies loss of bowel or bladder control. Denies LE paresthesia.    Patient Stated Goals Better posture.    Currently in Pain? No/denies    Pain Score 0-No pain    Pain Onset 1 to 4 weeks ago  PT Education - 11/13/20 0854    Education Details ther-ex    Person(s) Educated Patient    Methods Explanation;Demonstration;Tactile cues;Verbal cues    Comprehension Returned demonstration;Verbalized understanding             Objective    No latex band allergies  Decreased low back pain with R lateral shift correct.   Slump test: (+) L with reproduction of symptoms. (+) Long sit test suggesting anterior nutation of L innominate  MedbridgeAccess Code [email or web address]  Therapeutic exercise   Seated manually resisted L lateral shift isometrics, PT manual resistance at neutral position. 10x5 seconds for 3 sets  Seated manually resisted trunk extension isometrics in neutral with PT manual resistance 10x3 with 5 seconds holds  Bridge  10x3 with 5 second holds   Prone glute max extension   L LE 10x2  R LE 10x2  Prone shoulder flexion   R 10x  L 10x  Prone alternate shoulder flexion contralateral hip extension 10x each side  Planks on knees   Regular 15 seconds x 3  Seated thoracic extension on chair 10x5 seconds  Seated B shoulder extension isometrics, hands on thighs to promote trunk muscle strength 10x5 seconds      Improved exercise technique, movement at target joints, use of target muscles after mod verbal, visual, tactile cues.  Response to treatment Pt tolerated session well without aggravation of symptoms   Clinical impression Pt making very good progress with decreased back and L LE pain based on subjective reports. Continued with back extension related exercises to continue progress. Pt tolerated session well without aggravation of symptoms. Pt will benefit from continued skilled physical therapy services to decrease pain, improve strength and function.        PT Short Term Goals - 11/07/20 1328      PT SHORT TERM GOAL #1   Title Pt will be independent with her initial HEP to decrease pain, improve strength, and ability to perform tasks which involve bending over as well as lifting her L LE more comfortably.    Baseline Pt has started her HEP. (11/07/2020)    Time 3    Period Weeks    Status New    Target Date 11/28/20             PT Long Term Goals - 11/07/20 1330      PT LONG TERM GOAL #1   Title Patient will have a decrease in back pain to 1/10 or less at worst to promote ability to perform tasks which involve bending over as well as lifting her L LE more comfortably.    Baseline 7/10 back pain at most for the past 1.5 weeks (11/07/2020)    Time 8    Period Weeks    Status New    Target Date 01/02/21      PT LONG TERM GOAL #2   Title Patient will improve bilateral hip extension and abduction strength by at least 1/2 MMT grade to promote ability to lift her L LE up more  comfortably.    Baseline Hip extension 4-/5 R, 3+/5 L, hip abduction 4/5 R, 4-/5 L (11/07/2020)    Time 8    Period Weeks    Status New    Target Date 01/02/21      PT LONG TERM GOAL #3   Title Pt will improve her lumbar FOTO score by at least 10 points as a demonstration of improved function.    Baseline Lumbar spine FOTO 15 (  11/07/2020)    Time 8    Period Weeks    Status New    Target Date 01/02/21                 Plan - 11/13/20 0848    Clinical Impression Statement Pt making very good progress with decreased back and L LE pain based on subjective reports. Continued with back extension related exercises to continue progress. Pt tolerated session well without aggravation of symptoms. Pt will benefit from continued skilled physical therapy services to decrease pain, improve strength and function.    Personal Factors and Comorbidities Comorbidity 2;Fitness;Profession    Comorbidities Anxiety, hx of C-section    Examination-Activity Limitations Bend;Sit;Lift;Squat;Carry;Stairs    Stability/Clinical Decision Making Stable/Uncomplicated    Rehab Potential Fair    PT Frequency 2x / week    PT Duration 8 weeks    PT Treatment/Interventions Therapeutic activities;Therapeutic exercise;Functional mobility training;Neuromuscular re-education;Patient/family education;Manual techniques;Dry needling;Spinal Manipulations;Joint Manipulations;Aquatic Therapy;Biofeedback;Electrical Stimulation;Iontophoresis 4mg /ml Dexamethasone;Traction    PT Next Visit Plan Posture, extension, trunk and hip strengthening, thoracic extension, scapular strengthening, manual techniques, modalities PRN    PT Home Exercise Plan Medbridge Access Code [email or web address]    Consulted and Agree with Plan of Care Patient           Patient will benefit from skilled therapeutic intervention in order to improve the following deficits and impairments:  Pain,Postural dysfunction,Improper body mechanics,Decreased strength  Visit  Diagnosis: Acute left-sided low back pain, unspecified whether sciatica present  Muscle weakness (generalized)     Problem List Patient Active Problem List   Diagnosis Date Noted  . OSA on CPAP 09/23/2020  . Type 2 diabetes mellitus without complication, without long-term current use of insulin (HCC) 09/23/2020  . Lynch syndrome 06/12/2020  . Allergy with anaphylaxis due to food 06/12/2020  . Metabolic syndrome 06/12/2020  . Iron deficiency anemia 12/14/2019  . Hypothyroidism 10/04/2019  . Seborrheic dermatitis of scalp 10/04/2019  . Class 3 severe obesity with body mass index (BMI) of 40.0 to 44.9 in adult [facility]) 10/04/2019  . Prothrombin gene mutation (HCC) 10/04/2019  . S/P total hysterectomy and bilateral salpingo-oophorectomy 06/26/2019  . Hyperlipidemia 04/10/2019  . Essential hypertension 04/10/2019  . Prediabetes 04/10/2019  . Major depressive disorder with current active episode 04/10/2019  . Gastroesophageal reflux disease 04/10/2019  . Allergic rhinitis 04/10/2019    04/12/2019 PT, DPT   11/13/2020, 11:08 AM  [facility] PHYSICAL AND SPORTS MEDICINE [address], [place] Phone: [phone]   Fax:  [phone]  Name: Adiba Fargnoli MRN: [name] Date of Birth: 05/06/1976

## 2020-11-17 ENCOUNTER — Encounter: Payer: Self-pay | Admitting: Family Medicine

## 2020-11-17 DIAGNOSIS — T7800XA Anaphylactic reaction due to unspecified food, initial encounter: Secondary | ICD-10-CM

## 2020-11-18 ENCOUNTER — Other Ambulatory Visit: Payer: Self-pay

## 2020-11-18 ENCOUNTER — Ambulatory Visit: Payer: BC Managed Care – PPO

## 2020-11-18 DIAGNOSIS — M545 Low back pain, unspecified: Secondary | ICD-10-CM | POA: Diagnosis not present

## 2020-11-18 DIAGNOSIS — M6281 Muscle weakness (generalized): Secondary | ICD-10-CM

## 2020-11-18 MED ORDER — EPINEPHRINE 0.3 MG/0.3ML IJ SOAJ: 0.3000 mg | INTRAMUSCULAR | 0 refills | Status: DC | PRN

## 2020-11-18 NOTE — Therapy (Signed)
[place] [facility]ICINE [address] [place], [address] Phone: [phone]   Fax:  [phone]  Physical Therapy Treatment  Patient Details  Name: Joanna Scott MRN: [id number] Date of Birth: [birth year] Referring Provider (PT): Danelle Berry, PA-C   Encounter Date: 11/18/2020   PT End of Session - 11/18/20 0849    Visit Number 4    Number of Visits 17    Date for PT Re-Evaluation 01/02/21    PT Start Time 0848    PT Stop Time 0928    PT Time Calculation (min) 40 min    Activity Tolerance Patient tolerated treatment well    Behavior During Therapy [facility] for tasks assessed/performed           Past Medical History:  Diagnosis Date  . Anemia   . Anemia 04/10/2019  . Anxiety   . Bilateral polycystic ovarian syndrome 10/20/2012  . Family history of adverse reaction to anesthesia    malignant hyperthermia in 2 first cousins   . Family history of malignant hyperthermia   . GERD (gastroesophageal reflux disease)   . Hypothyroidism   . Microcytosis 10/04/2019  . Obesity affecting pregnancy   . PCOS (polycystic ovarian syndrome)   . Portal vein thrombosis   . Postpartum care following cesarean delivery 12/19/2014  . Preeclampsia in postpartum period   . Pregnancy   . Previous cesarean delivery, delivered 12/16/2014  . Previous cesarean section   . Prothrombin gene mutation (HCC)   . S/P cesarean section 12/17/2014  . Scalp psoriasis   . Sleep apnea     Past Surgical History:  Procedure Laterality Date  . CESAREAN SECTION    . CESAREAN SECTION N/A 12/17/2014   Procedure: CESAREAN SECTION;  Surgeon: Conard Novak, MD;  Location: [facility] ORS;  Service: Obstetrics;  Laterality: N/A;  . CYSTOSCOPY  06/26/2019   Procedure: CYSTOSCOPY;  Surgeon: Ward, Elenora Fender, MD;  Location: [facility];  Service: Gynecology;;  . Bluford Kaufmann WITH STENT PLACEMENT Right 06/26/2019   Procedure: CYSTOSCOPY WITH STENT PLACEMENT, Cystotomy;  Surgeon:  Ward, Elenora Fender, MD;  Location: [facility] OR[facility];  Service: Gynecology;  Laterality: Right;  . DIAGNOSTIC LAPAROSCOPY    . DILATION AND CURETTAGE OF UTERUS    . LAPAROSCOPIC CHOLECYSTECTOMY    . Lynch syndrome    . ROBOTIC ASSISTED TOTAL HYSTERECTOMY WITH BILATERAL SALPINGO OOPHERECTOMY Bilateral 06/26/2019   Procedure: XI ROBOTIC ASSISTED TOTAL HYSTERECTOMY WITH BILATERAL SALPINGO OOPHORECTOMY;  Surgeon: Ward, Elenora Fender, MD;  Location: [facility] ORS;  Service: Gynecology;  Laterality: Bilateral;    There were no vitals filed for this visit.   Subjective Assessment - 11/18/20 0849    Subjective Back feels great, no pain. 1/10 back pain at most for the past 7 days. Feels like she has full range of motion back.    Pertinent History L mid to low back pain. Pain began 10/26/2020. P t was picking up a laundry basket and did not realize there was a 12 lb item in it. Pt picked up the laundry basket and twisted to the R. Pain is located L thoracic and lumbar and posterior lateral hip to L proximal posterior thigh. Denies loss of bowel or bladder control. Denies LE paresthesia.    Patient Stated Goals Better posture.    Currently in Pain? No/denies    Pain Onset 1 to 4 weeks ago              [facility] PT Assessment - 11/18/20  0851      AROM   Lumbar Flexion WFL    Lumbar Extension WFL with L rotation    Lumbar - Right Side Bend [facility]    Lumbar - Left Side Bend [facility]    Lumbar - Right Rotation Full    Lumbar - Left Rotation [facility]                                 PT Education - 11/18/20 0859    Education Details ther-ex    Person(s) Educated Patient    Methods Explanation;Demonstration;Tactile cues;Verbal cues    Comprehension Returned demonstration;Verbalized understanding          Objective    No latex band allergies  Decreased low back pain with R lateral shift correct.   Slump test: (+) L with reproduction of symptoms. (+) Long sit test suggesting anterior nutation of L  innominate  MedbridgeAccess Code [email or web address]  Therapeutic exercise   Standing lumbar AROM   Flexion, WFL, no pain  Extension WFL, no pain  R side bend WFL, no pain  L side bend WFL, no pain  R rotation Full, no pain  L rotation WFL, no pain   Standing lumbar extension, emphasis on neutral position (no rotation) 10x  Planks on knees              Regular 15 seconds x 3  Prone glute max extension              L LE 10x3             R LE 10x3  S/L hip abduction   R 10x3  L 10x3  Bridge with march 10x each LE   Ergonomic lifting  19.5 lbs   Floor to chair on L 10x  Heavy. Therapeutic rest break needed afterwards to allow for recovery for next exercises.    Seated thoracic extension on chair 10x5 seconds for 2 sets   Seated B shoulder extension isometrics, hands on thighs to promote trunk muscle strength 10x5 seconds for 3 sets       Improved exercise technique, movement at target joints, use of target muscles after mod verbal, visual, tactile cues.  Response to treatment Pt tolerated session well without aggravation of symptoms   Clinical impression Pt making very good progress with decreased back and L LE pain based on subjective reports with decreased worst pain level to 1/10 at most for the past 7 days based on subjective reports. Continued working on trunk, and glute strength to decrease stress to low back. Pt tolerated session well without aggravation of symptoms. Pt will benefit from continued skilled physical therapy services to decrease pain, improve strength and function.         PT Short Term Goals - 11/07/20 1328      PT SHORT TERM GOAL #1   Title Pt will be independent with her initial HEP to decrease pain, improve strength, and ability to perform tasks which involve bending over as well as lifting her L LE more comfortably.    Baseline Pt has started her HEP. (11/07/2020)    Time 3    Period Weeks    Status New    Target Date 11/28/20              PT Long Term Goals - 11/07/20 1330      PT LONG TERM GOAL #1   Title Patient will  have a decrease in back pain to 1/10 or less at worst to promote ability to perform tasks which involve bending over as well as lifting her L LE more comfortably.    Baseline 7/10 back pain at most for the past 1.5 weeks (11/07/2020)    Time 8    Period Weeks    Status New    Target Date 01/02/21      PT LONG TERM GOAL #2   Title Patient will improve bilateral hip extension and abduction strength by at least 1/2 MMT grade to promote ability to lift her L LE up more comfortably.    Baseline Hip extension 4-/5 R, 3+/5 L, hip abduction 4/5 R, 4-/5 L (11/07/2020)    Time 8    Period Weeks    Status New    Target Date 01/02/21      PT LONG TERM GOAL #3   Title Pt will improve her lumbar FOTO score by at least 10 points as a demonstration of improved function.    Baseline Lumbar spine FOTO 15 (11/07/2020)    Time 8    Period Weeks    Status New    Target Date 01/02/21                 Plan - 11/18/20 0859    Clinical Impression Statement Pt making very good progress with decreased back and L LE pain based on subjective reports with decreased worst pain level to 1/10 at most for the past 7 days based on subjective reports. Continued working on trunk, and glute strength to decrease stress to low back. Pt tolerated session well without aggravation of symptoms. Pt will benefit from continued skilled physical therapy services to decrease pain, improve strength and function.    Personal Factors and Comorbidities Comorbidity 2;Fitness;Profession    Comorbidities Anxiety, hx of C-section    Examination-Activity Limitations Bend;Sit;Lift;Squat;Carry;Stairs    Stability/Clinical Decision Making Stable/Uncomplicated    Rehab Potential Fair    PT Frequency 2x / week    PT Duration 8 weeks    PT Treatment/Interventions Therapeutic activities;Therapeutic exercise;Functional mobility  training;Neuromuscular re-education;Patient/family education;Manual techniques;Dry needling;Spinal Manipulations;Joint Manipulations;Aquatic Therapy;Biofeedback;Electrical Stimulation;Iontophoresis 4mg /ml Dexamethasone;Traction    PT Next Visit Plan Posture, extension, trunk and hip strengthening, thoracic extension, scapular strengthening, manual techniques, modalities PRN    PT Home Exercise Plan Medbridge Access Code [email or web address]    Consulted and Agree with Plan of Care Patient           Patient will benefit from skilled therapeutic intervention in order to improve the following deficits and impairments:  Pain,Postural dysfunction,Improper body mechanics,Decreased strength  Visit Diagnosis: Acute left-sided low back pain, unspecified whether sciatica present  Muscle weakness (generalized)     Problem List Patient Active Problem List   Diagnosis Date Noted  . OSA on CPAP 09/23/2020  . Type 2 diabetes mellitus without complication, without long-term current use of insulin (HCC) 09/23/2020  . Lynch syndrome 06/12/2020  . Allergy with anaphylaxis due to food 06/12/2020  . Metabolic syndrome 06/12/2020  . Iron deficiency anemia 12/14/2019  . Hypothyroidism 10/04/2019  . Seborrheic dermatitis of scalp 10/04/2019  . Class 3 severe obesity with body mass index (BMI) of 40.0 to 44.9 in adult [facility]) 10/04/2019  . Prothrombin gene mutation (HCC) 10/04/2019  . S/P total hysterectomy and bilateral salpingo-oophorectomy 06/26/2019  . Hyperlipidemia 04/10/2019  . Essential hypertension 04/10/2019  . Prediabetes 04/10/2019  . Major depressive disorder with current active episode 04/10/2019  . Gastroesophageal reflux  disease 04/10/2019  . Allergic rhinitis 04/10/2019    Loralyn FreshwaterMiguel Rodriquez Thorner PT, DPT f  11/18/2020, 9:31 AM  [place] [facility] [address] [place], [place], [address] Phone: [phone]   Fax:  [phone]  Name: Artis DelayHeidi  Ginther MRN: [id number] Date of Birth: 17-Dec-1975

## 2020-11-19 ENCOUNTER — Ambulatory Visit: Admission: RE | Admit: 2020-11-19 | Payer: BC Managed Care – PPO | Source: Ambulatory Visit

## 2020-11-20 ENCOUNTER — Ambulatory Visit: Payer: BC Managed Care – PPO

## 2020-11-20 ENCOUNTER — Other Ambulatory Visit: Payer: Self-pay

## 2020-11-20 DIAGNOSIS — M6281 Muscle weakness (generalized): Secondary | ICD-10-CM | POA: Diagnosis not present

## 2020-11-20 DIAGNOSIS — M545 Low back pain, unspecified: Secondary | ICD-10-CM

## 2020-11-20 NOTE — Therapy (Signed)
[place] [facility] MEDIC[facility] [address] [place], [address] Phone: [phone]   Fax:  [phone]  Physical Therapy Treatment  Patient Details  Name: Joanna Scott MRN: [id number] Date of Birth: [birth year] Referring Provider (PT): Danelle Berry, [place]   Encounter Date: 11/20/2020   PT End of Session - 11/20/20 0849    Visit Number 5    Number of Visits 17    Date for PT Re-Evaluation 01/02/21    PT Start Time 0849    PT Stop Time 0915    PT Time Calculation (min) 26 min    Activity Tolerance Patient tolerated treatment well    Behavior During Therapy [facility] for tasks assessed/performed           Past Medical History:  Diagnosis Date  . Anemia   . Anemia 04/10/2019  . Anxiety   . Bilateral polycystic ovarian syndrome 10/20/2012  . Family history of adverse reaction to anesthesia    malignant hyperthermia in 2 first cousins   . Family history of malignant hyperthermia   . GERD (gastroesophageal reflux disease)   . Hypothyroidism   . Microcytosis 10/04/2019  . Obesity affecting pregnancy   . PCOS (polycystic ovarian syndrome)   . Portal vein thrombosis   . Postpartum care following cesarean delivery 12/19/2014  . Preeclampsia in postpartum period   . Pregnancy   . Previous cesarean delivery, delivered 12/16/2014  . Previous cesarean section   . Prothrombin gene mutation (HCC)   . S/P cesarean section 12/17/2014  . Scalp psoriasis   . Sleep apnea     Past Surgical History:  Procedure Laterality Date  . CESAREAN SECTION    . CESAREAN SECTION N/A 12/17/2014   Procedure: CESAREAN SECTION;  Surgeon: Conard Novak, MD;  Location: [facility];  Service: Obstetrics;  Laterality: N/A;  . CYSTOSCOPY  06/26/2019   Procedure: CYSTOSCOPY;  Surgeon: Ward, Elenora Fender, MD;  Location: [facility];  Service: Gynecology;;  . Bluford Kaufmann WITH STENT PLACEMENT Right 06/26/2019   Procedure: CYSTOSCOPY WITH STENT PLACEMENT, Cystotomy;  Surgeon:  Ward, Elenora Fender, MD;  Location: [facility];  Service: Gynecology;  Laterality: Right;  . DIAGNOSTIC LAPAROSCOPY    . DILATION AND CURETTAGE OF UTERUS    . LAPAROSCOPIC CHOLECYSTECTOMY    . Lynch syndrome    . ROBOTIC ASSISTED TOTAL HYSTERECTOMY WITH BILATERAL SALPINGO OOPHERECTOMY Bilateral 06/26/2019   Procedure: XI ROBOTIC ASSISTED TOTAL HYSTERECTOMY WITH BILATERAL SALPINGO OOPHORECTOMY;  Surgeon: Ward, Elenora Fender, MD;  Location: [facility] ORS;  Service: Gynecology;  Laterality: Bilateral;    There were no vitals filed for this visit.   Subjective Assessment - 11/20/20 0850    Subjective Has to be out by 9:15 am today. Back is stiff, but does not hurt.    Pertinent History L mid to low back pain. Pain began 10/26/2020. P t was picking up a laundry basket and did not realize there was a 12 lb item in it. Pt picked up the laundry basket and twisted to the R. Pain is located L thoracic and lumbar and posterior lateral hip to L proximal posterior thigh. Denies loss of bowel or bladder control. Denies LE paresthesia.    Patient Stated Goals Better posture.    Currently in Pain? No/denies    Pain Score 0-No pain    Pain Onset 1 to 4 weeks ago  PT Education - 11/20/20 0853    Education Details ther-ex    Person(s) Educated Patient    Methods Explanation;Demonstration;Tactile cues;Verbal cues    Comprehension Returned demonstration;Verbalized understanding          Objective    No latex band allergies  Decreased low back pain with R lateral shift correct.   Slump test: (+) L with reproduction of symptoms. (+) Long sit test suggesting anterior nutation of L innominate  MedbridgeAccess Code [email or web address]  Therapeutic exercise   Standing lumbar extension, emphasis on neutral position (no rotation) 10x2  Planks on knees  Regular 15 seconds x 5  Prone glute max extension L LE10x3 R  LE 10x3  S/L hip abduction              R 10x3             L 10x3  Bridge with march 10x each LE   Ergonomic lifting             19.5 lbs              Floor to chair on L 5x  Floor to chair on L 5x              Seated B shoulder extension isometrics, hands on thighs to promote trunk muscle strength 10x5 seconds   Reviewed plan of care: possible graduation from PT to HEP next week if appropriate.    Improved exercise technique, movement at target joints, use of target muscles after mod verbal, visual, tactile cues.  Response to treatment Pt tolerated session well without aggravation of symptoms   Clinical impression Pt continues to do well with PT with decreased back pain . Continued working on improving trunk, glute strength as well as ergonomics to decrease stress to low back with ADLs. Pt tolerated session well without aggravation of symptoms. Good muscle use felt with exercises. Pt will benefit from continued skilled physical therapy services to decrease pain, improve strength and function.          PT Short Term Goals - 11/07/20 1328      PT SHORT TERM GOAL #1   Title Pt will be independent with her initial HEP to decrease pain, improve strength, and ability to perform tasks which involve bending over as well as lifting her L LE more comfortably.    Baseline Pt has started her HEP. (11/07/2020)    Time 3    Period Weeks    Status New    Target Date 11/28/20             PT Long Term Goals - 11/07/20 1330      PT LONG TERM GOAL #1   Title Patient will have a decrease in back pain to 1/10 or less at worst to promote ability to perform tasks which involve bending over as well as lifting her L LE more comfortably.    Baseline 7/10 back pain at most for the past 1.5 weeks (11/07/2020)    Time 8    Period Weeks    Status New    Target Date 01/02/21      PT LONG TERM GOAL #2   Title Patient will improve bilateral hip extension and abduction strength by  at least 1/2 MMT grade to promote ability to lift her L LE up more comfortably.    Baseline Hip extension 4-/5 R, 3+/5 L, hip abduction 4/5 R, 4-/5 L (11/07/2020)    Time 8  Period Weeks    Status New    Target Date 01/02/21      PT LONG TERM GOAL #3   Title Pt will improve her lumbar FOTO score by at least 10 points as a demonstration of improved function.    Baseline Lumbar spine FOTO 15 (11/07/2020)    Time 8    Period Weeks    Status New    Target Date 01/02/21                 Plan - 11/20/20 0853    Clinical Impression Statement Pt continues to do well with PT with decreased back pain . Continued working on improving trunk, glute strength as well as ergonomics to decrease stress to low back with ADLs. Pt tolerated session well without aggravation of symptoms. Good muscle use felt with exercises. Pt will benefit from continued skilled physical therapy services to decrease pain, improve strength and function.    Personal Factors and Comorbidities Comorbidity 2;Fitness;Profession    Comorbidities Anxiety, hx of C-section    Examination-Activity Limitations Bend;Sit;Lift;Squat;Carry;Stairs    Stability/Clinical Decision Making Stable/Uncomplicated    Clinical Decision Making Low    Rehab Potential Fair    PT Frequency 2x / week    PT Duration 8 weeks    PT Treatment/Interventions Therapeutic activities;Therapeutic exercise;Functional mobility training;Neuromuscular re-education;Patient/family education;Manual techniques;Dry needling;Spinal Manipulations;Joint Manipulations;Aquatic Therapy;Biofeedback;Electrical Stimulation;Iontophoresis 4mg /ml Dexamethasone;Traction    PT Next Visit Plan Posture, extension, trunk and hip strengthening, thoracic extension, scapular strengthening, manual techniques, modalities PRN    PT Home Exercise Plan Medbridge Access Code [email or web address]    Consulted and Agree with Plan of Care Patient           Patient will benefit from skilled therapeutic  intervention in order to improve the following deficits and impairments:  Pain,Postural dysfunction,Improper body mechanics,Decreased strength  Visit Diagnosis: Acute left-sided low back pain, unspecified whether sciatica present  Muscle weakness (generalized)     Problem List Patient Active Problem List   Diagnosis Date Noted  . OSA on CPAP 09/23/2020  . Type 2 diabetes mellitus without complication, without long-term current use of insulin (HCC) 09/23/2020  . Lynch syndrome 06/12/2020  . Allergy with anaphylaxis due to food 06/12/2020  . Metabolic syndrome 06/12/2020  . Iron deficiency anemia 12/14/2019  . Hypothyroidism 10/04/2019  . Seborrheic dermatitis of scalp 10/04/2019  . Class 3 severe obesity with body mass index (BMI) of 40.0 to 44.9 in adult [facility]) 10/04/2019  . Prothrombin gene mutation (HCC) 10/04/2019  . S/P total hysterectomy and bilateral salpingo-oophorectomy 06/26/2019  . Hyperlipidemia 04/10/2019  . Essential hypertension 04/10/2019  . Prediabetes 04/10/2019  . Major depressive disorder with current active episode 04/10/2019  . Gastroesophageal reflux disease 04/10/2019  . Allergic rhinitis 04/10/2019    04/12/2019 PT, DPT  11/20/2020, 9:21 AM  [facility] PHYSICAL AND SPORTS MEDICINE [address], [address], [place] Phone: [phone]   Fax:  [phone]  Name: Joanna Scott MRN: [name] Date of Birth: Nov 28, 1975

## 2020-11-25 ENCOUNTER — Other Ambulatory Visit: Payer: Self-pay

## 2020-11-25 ENCOUNTER — Ambulatory Visit: Payer: BC Managed Care – PPO

## 2020-11-25 ENCOUNTER — Encounter: Payer: Self-pay | Admitting: Family Medicine

## 2020-11-25 DIAGNOSIS — M6281 Muscle weakness (generalized): Secondary | ICD-10-CM | POA: Diagnosis not present

## 2020-11-25 DIAGNOSIS — M545 Low back pain, unspecified: Secondary | ICD-10-CM

## 2020-11-25 NOTE — Therapy (Signed)
[facility] PHYS[facility] SPORTS MEDICINE [address], [place], [address] Phone: [phone]   Fax:  [phone]  Physical Therapy Treatment  Patient Details  Name: Joanna Scott MRN: [id number] Date of Birth: 1975-11-11 Referring Provider (PT): Danelle Berry, PA-C   Encounter Date: 11/25/2020   PT End of Session - 11/25/20 0850    Visit Number 6    Number of Visits 17    Date for PT Re-Evaluation 01/02/21    PT Start Time 0849    PT Stop Time 0930    PT Time Calculation (min) 41 min    Activity Tolerance Patient tolerated treatment well    Behavior During Therapy [facility] for tasks assessed/performed           Past Medical History:  Diagnosis Date  . Anemia   . Anemia 04/10/2019  . Anxiety   . Bilateral polycystic ovarian syndrome 10/20/2012  . Family history of adverse reaction to anesthesia    malignant hyperthermia in 2 first cousins   . Family history of malignant hyperthermia   . GERD (gastroesophageal reflux disease)   . Hypothyroidism   . Microcytosis 10/04/2019  . Obesity affecting pregnancy   . PCOS (polycystic ovarian syndrome)   . Portal vein thrombosis   . Postpartum care following cesarean delivery 12/19/2014  . Preeclampsia in postpartum period   . Pregnancy   . Previous cesarean delivery, delivered 12/16/2014  . Previous cesarean section   . Prothrombin gene mutation (HCC)   . S/P cesarean section 12/17/2014  . Scalp psoriasis   . Sleep apnea     Past Surgical History:  Procedure Laterality Date  . CESAREAN SECTION    . CESAREAN SECTION N/A 12/17/2014   Procedure: CESAREAN SECTION;  Surgeon: Conard Novak, MD;  Location: [facility] ORS;  Service: Obstetrics;  Laterality: N/A;  . CYSTOSCOPY  06/26/2019   Procedure: CYSTOSCOPY;  Surgeon: Ward, Elenora Fender, MD;  Location: [facility] ORS;  Service: Gynecology;;  . Bluford Kaufmann WITH STENT PLACEMENT Right 06/26/2019   Procedure: CYSTOSCOPY WITH STENT PLACEMENT, Cystotomy;  Surgeon:  Ward, Elenora Fender, MD;  Location: [facility] ORS;  Service: Gynecology;  Laterality: Right;  . DIAGNOSTIC LAPAROSCOPY    . DILATION AND CURETTAGE OF UTERUS    . LAPAROSCOPIC CHOLECYSTECTOMY    . Lynch syndrome    . ROBOTIC ASSISTED TOTAL HYSTERECTOMY WITH BILATERAL SALPINGO OOPHERECTOMY Bilateral 06/26/2019   Procedure: XI ROBOTIC ASSISTED TOTAL HYSTERECTOMY WITH BILATERAL SALPINGO OOPHORECTOMY;  Surgeon: Ward, Elenora Fender, MD;  Location: [facility] ORS;  Service: Gynecology;  Laterality: Bilateral;    There were no vitals filed for this visit.   Subjective Assessment - 11/25/20 0850    Subjective Back is good. No pain currently. 0/10 at most for the past 7 days.    Pertinent History L mid to low back pain. Pain began 10/26/2020. P t was picking up a laundry basket and did not realize there was a 12 lb item in it. Pt picked up the laundry basket and twisted to the R. Pain is located L thoracic and lumbar and posterior lateral hip to L proximal posterior thigh. Denies loss of bowel or bladder control. Denies LE paresthesia.    Patient Stated Goals Better posture.    Currently in Pain? No/denies    Pain Score 0-No pain    Pain Onset 1 to 4 weeks ago  PT Education - 11/25/20 0857    Education Details ther-ex, HEP    Person(s) Educated Patient    Methods Explanation;Demonstration;Tactile cues;Verbal cues;Handout    Comprehension Returned demonstration;Verbalized understanding             Objective    No latex band allergies  Decreased low back pain with R lateral shift correct.   Slump test: (+) L with reproduction of symptoms. (+) Long sit test suggesting anterior nutation of L innominate  MedbridgeAccess Code [email or web address]  Therapeutic exercise   Standing lumbar AROM   Flexion. No pain but hinges at back on return motion  Extension: No pain  Side bend   R: no pain   L: no pain  Rotation   R: no pain   L: no  pain  Seated hip hinging 10x3    Then squat to low table 10x   B knee crepitus  Planks on knees  Regular 15 seconds x 5  Prone glute max extension L LE10x5 second holds for 2 sets.  R LE 10x5 second holds for 2 sets. Difficult for R LE.   Prone shoulder alternate shoulder flexion, contralateral hip extension 5x2 each side.   S/L hip abduction  R 10x3 L 10x3  Standing B shoulder extension at [facility] machine. Plate 10 with 3 lb dumbbell 10x5 seconds for 3 sets   Seated B shoulder extension isometrics, hands on thighs to promote trunk muscle strength 10x5 seconds     Improved exercise technique, movement at target joints, use of target muscles after mod verbal, visual, tactile cues.  Response to treatment Pt tolerated session well without aggravation of symptoms   Clinical impression Pt making progress with PT towards pain with reports of 0/10 back pain at most for the past 7 days. Continued working on trunk and glute strength as well as trunk control to decrease stress to low back. Pt tolerated session well without aggravation of symptoms. Pt will benefit from continued skilled physical therapy services to decrease pain, improve strength and function.         PT Short Term Goals - 11/07/20 1328      PT SHORT TERM GOAL #1   Title Pt will be independent with her initial HEP to decrease pain, improve strength, and ability to perform tasks which involve bending over as well as lifting her L LE more comfortably.    Baseline Pt has started her HEP. (11/07/2020)    Time 3    Period Weeks    Status New    Target Date 11/28/20             PT Long Term Goals - 11/07/20 1330      PT LONG TERM GOAL #1   Title Patient will have a decrease in back pain to 1/10 or less at worst to promote ability to perform tasks which involve bending over as well as lifting her L LE more comfortably.    Baseline 7/10 back  pain at most for the past 1.5 weeks (11/07/2020)    Time 8    Period Weeks    Status New    Target Date 01/02/21      PT LONG TERM GOAL #2   Title Patient will improve bilateral hip extension and abduction strength by at least 1/2 MMT grade to promote ability to lift her L LE up more comfortably.    Baseline Hip extension 4-/5 R, 3+/5 L, hip abduction 4/5 R, 4-/5 L (11/07/2020)    Time  8    Period Weeks    Status New    Target Date 01/02/21      PT LONG TERM GOAL #3   Title Pt will improve her lumbar FOTO score by at least 10 points as a demonstration of improved function.    Baseline Lumbar spine FOTO 15 (11/07/2020)    Time 8    Period Weeks    Status New    Target Date 01/02/21                 Plan - 11/25/20 0857    Clinical Impression Statement Pt making progress with PT towards pain with reports of 0/10 back pain at most for the past 7 days. Continued working on trunk and glute strength as well as trunk control to decrease stress to low back. Pt tolerated session well without aggravation of symptoms. Pt will benefit from continued skilled physical therapy services to decrease pain, improve strength and function.    Personal Factors and Comorbidities Comorbidity 2;Fitness;Profession    Comorbidities Anxiety, hx of C-section    Examination-Activity Limitations Bend;Sit;Lift;Squat;Carry;Stairs    Stability/Clinical Decision Making Stable/Uncomplicated    Rehab Potential Fair    PT Frequency 2x / week    PT Duration 8 weeks    PT Treatment/Interventions Therapeutic activities;Therapeutic exercise;Functional mobility training;Neuromuscular re-education;Patient/family education;Manual techniques;Dry needling;Spinal Manipulations;Joint Manipulations;Aquatic Therapy;Biofeedback;Electrical Stimulation;Iontophoresis 4mg /ml Dexamethasone;Traction    PT Next Visit Plan Posture, extension, trunk and hip strengthening, thoracic extension, scapular strengthening, manual techniques,  modalities PRN    PT Home Exercise Plan Medbridge Access Code [email or web address]    Consulted and Agree with Plan of Care Patient           Patient will benefit from skilled therapeutic intervention in order to improve the following deficits and impairments:  Pain,Postural dysfunction,Improper body mechanics,Decreased strength  Visit Diagnosis: Acute left-sided low back pain, unspecified whether sciatica present  Muscle weakness (generalized)     Problem List Patient Active Problem List   Diagnosis Date Noted  . OSA on CPAP 09/23/2020  . Type 2 diabetes mellitus without complication, without long-term current use of insulin (HCC) 09/23/2020  . Lynch syndrome 06/12/2020  . Allergy with anaphylaxis due to food 06/12/2020  . Metabolic syndrome 06/12/2020  . Iron deficiency anemia 12/14/2019  . Hypothyroidism 10/04/2019  . Seborrheic dermatitis of scalp 10/04/2019  . Class 3 severe obesity with body mass index (BMI) of 40.0 to 44.9 in adult [facility]) 10/04/2019  . Prothrombin gene mutation (HCC) 10/04/2019  . S/P total hysterectomy and bilateral salpingo-oophorectomy 06/26/2019  . Hyperlipidemia 04/10/2019  . Essential hypertension 04/10/2019  . Prediabetes 04/10/2019  . Major depressive disorder with current active episode 04/10/2019  . Gastroesophageal reflux disease 04/10/2019  . Allergic rhinitis 04/10/2019    04/12/2019 PT, DPT   11/25/2020, 9:31 AM  [place] [facility] PHYSICAL AND SPORTS MEDICINE [address], [address], [place] Phone: [phone]   Fax:  [phone]  Name: Marquis Diles MRN: [name] Date of Birth: January 02, 1976

## 2020-11-25 NOTE — Patient Instructions (Signed)
Access Code: [email or web address] URL: [email or web address] Date: 11/25/2020 Prepared by: Loralyn Freshwater  Exercises Prone on Elbows Stretch - 3 x daily - 7 x weekly - 1 sets - 2-3 reps - 2 minutes hold Seated Thoracic Lumbar Extension - 1 x daily - 7 x weekly - 3 sets - 10 reps - 5 seconds hold Supine Bridge - 1 x daily - 7 x weekly - 3 sets - 10 reps Prone Press Up - 1 x daily - 7 x weekly - 3 sets - 10 reps - 5 seconds hold Seated Hip Hinge with Dowel - 1 x daily - 7 x weekly - 3 sets - 10 reps

## 2020-11-27 ENCOUNTER — Other Ambulatory Visit: Payer: Self-pay

## 2020-11-27 ENCOUNTER — Ambulatory Visit: Payer: BC Managed Care – PPO

## 2020-11-27 DIAGNOSIS — M6281 Muscle weakness (generalized): Secondary | ICD-10-CM

## 2020-11-27 DIAGNOSIS — M545 Low back pain, unspecified: Secondary | ICD-10-CM | POA: Diagnosis not present

## 2020-11-27 NOTE — Patient Instructions (Addendum)
Access Code: [email or web address] URL: [email or web address] Date: 11/27/2020  Prepared by: Loralyn Freshwater  Exercises Prone on Elbows Stretch - 3 x daily - 7 x weekly - 1 sets - 2-3 reps - 2 minutes hold Seated Thoracic Lumbar Extension - 1 x daily - 7 x weekly - 3 sets - 10 reps - 5 seconds hold Supine Bridge - 1 x daily - 7 x weekly - 3 sets - 10 reps Prone Press Up - 1 x daily - 7 x weekly - 3 sets - 10 reps - 5 seconds hold Seated Hip Hinge with Dowel - 1 x daily - 7 x weekly - 3 sets - 10 reps Prone Hip Extension with Bent Knee - 1 x daily - 7 x weekly - 2 sets - 10 reps - 5 seconds hold Sidelying Hip Abduction - 1 x daily - 7 x weekly - 3 sets - 10 reps     Sitting on a chair    Press your hands on your thighs to feel your abdominal muscles contract.   Hold for 5 seconds comfortably.   Repeat 10 times.   Perform 3 sets daily.

## 2020-11-27 NOTE — Therapy (Signed)
[facility] PHYSICAL [facility] SPORTS MEDICINE [address] [place], [address] Phone: [phone]   Fax:  [phone]  Physical Therapy Treatment And Discharge Summary    Patient Details  Name: Joanna Scott MRN: [id number] Date of Birth: 1976-03-31 Referring Provider (PT): Danelle Berry, PA-C   Encounter Date: 11/27/2020   PT End of Session - 11/27/20 1100    Visit Number 7    Number of Visits 17    Date for PT Re-Evaluation 01/02/21    PT Start Time 1100    PT Stop Time 1129    PT Time Calculation (min) 29 min    Activity Tolerance Patient tolerated treatment well    Behavior During Therapy [facility] for tasks assessed/performed           Past Medical History:  Diagnosis Date  . Anemia   . Anemia 04/10/2019  . Anxiety   . Bilateral polycystic ovarian syndrome 10/20/2012  . Family history of adverse reaction to anesthesia    malignant hyperthermia in 2 first cousins   . Family history of malignant hyperthermia   . GERD (gastroesophageal reflux disease)   . Hypothyroidism   . Microcytosis 10/04/2019  . Obesity affecting pregnancy   . PCOS (polycystic ovarian syndrome)   . Portal vein thrombosis   . Postpartum care following cesarean delivery 12/19/2014  . Preeclampsia in postpartum period   . Pregnancy   . Previous cesarean delivery, delivered 12/16/2014  . Previous cesarean section   . Prothrombin gene mutation (HCC)   . S/P cesarean section 12/17/2014  . Scalp psoriasis   . Sleep apnea     Past Surgical History:  Procedure Laterality Date  . CESAREAN SECTION    . CESAREAN SECTION N/A 12/17/2014   Procedure: CESAREAN SECTION;  Surgeon: Conard Novak, MD;  Location: [facility] ORS;  Service: Obstetrics;  Laterality: N/A;  . CYSTOSCOPY  06/26/2019   Procedure: CYSTOSCOPY;  Surgeon: Ward, Elenora Fender, MD;  Location: [facility] ORS;  Service: Gynecology;;  . Bluford Kaufmann WITH STENT PLACEMENT Right 06/26/2019   Procedure: CYSTOSCOPY WITH STENT  PLACEMENT, Cystotomy;  Surgeon: Ward, Elenora Fender, MD;  Location: [facility] ORS;  Service: Gynecology;  Laterality: Right;  . DIAGNOSTIC LAPAROSCOPY    . DILATION AND CURETTAGE OF UTERUS    . LAPAROSCOPIC CHOLECYSTECTOMY    . Lynch syndrome    . ROBOTIC ASSISTED TOTAL HYSTERECTOMY WITH BILATERAL SALPINGO OOPHERECTOMY Bilateral 06/26/2019   Procedure: XI ROBOTIC ASSISTED TOTAL HYSTERECTOMY WITH BILATERAL SALPINGO OOPHORECTOMY;  Surgeon: Ward, Elenora Fender, MD;  Location: [facility] ORS;  Service: Gynecology;  Laterality: Bilateral;    There were no vitals filed for this visit.   Subjective Assessment - 11/27/20 1102    Subjective Back feels good. No pain. Feels ready to graduate PT today.  No questions with ergonomic lifting. Was able to apply it to laundry tasks.    Pertinent History L mid to low back pain. Pain began 10/26/2020. P t was picking up a laundry basket and did not realize there was a 12 lb item in it. Pt picked up the laundry basket and twisted to the R. Pain is located L thoracic and lumbar and posterior lateral hip to L proximal posterior thigh. Denies loss of bowel or bladder control. Denies LE paresthesia.    Patient Stated Goals Better posture.    Currently in Pain? No/denies    Pain Onset 1 to 4 weeks ago  PT Education - 11/27/20 1107    Education Details ther-ex, HEP    Person(s) Educated Patient    Methods Explanation;Demonstration;Tactile cues;Verbal cues;Handout    Comprehension Returned demonstration;Verbalized understanding           Objective    No latex band allergies  Decreased low back pain with R lateral shift correct.   Slump test: (+) L with reproduction of symptoms. (+) Long sit test suggesting anterior nutation of L innominate  MedbridgeAccess Code [email or web address]  Therapeutic exercise   Manually resisted prone hip extension, D/L hip abduction 1x each way for each LE  Prone glute max  extension L LE10x5 second holds  R LE 10x5 second holds   S/L hip abduction   R 10x3  L 10x3  Planks on knees  Regular 15 seconds x5  Prone alternate shoulder flexion, contralateral hip extension 5x2 each side.   Seated B shoulder extension isometrics, hands on thighs to promote trunk muscle strength 10x5 secondsfor 3 sets     Improved exercise technique, movement at target joints, use of target muscles after mod verbal, visual, tactile cues.  Response to treatment Pt tolerated session well without aggravation of symptoms   Clinical impression Pt demonstrates improved hip strength, function, and significant decrease in back pain since initial evaluation. Pt has made very good progress with PT towards goals. Skilled physical therapy services discharged with pt continuing with her progress with her exercises at home.       PT Short Term Goals - 11/27/20 1103      PT SHORT TERM GOAL #1   Title Pt will be independent with her initial HEP to decrease pain, improve strength, and ability to perform tasks which involve bending over as well as lifting her L LE more comfortably.    Baseline Pt has started her HEP. (11/07/2020), No questions with HEP, has been performing them (11/27/2020)    Time 3    Period Weeks    Status Achieved    Target Date 11/28/20             PT Long Term Goals - 11/27/20 1104      PT LONG TERM GOAL #1   Title Patient will have a decrease in back pain to 1/10 or less at worst to promote ability to perform tasks which involve bending over as well as lifting her L LE more comfortably.    Baseline 7/10 back pain at most for the past 1.5 weeks (11/07/2020); 0/10 at most for the past 7 days (11/27/2020)    Time 8    Period Weeks    Status Achieved    Target Date 01/02/21      PT LONG TERM GOAL #2   Title Patient will improve bilateral hip extension and abduction strength by at least 1/2 MMT grade to  promote ability to lift her L LE up more comfortably.    Baseline Hip extension 4-/5 R, 3+/5 L, hip abduction 4/5 R, 4-/5 L (11/07/2020); hip extension 4/5 R, 4+/5 L,   hip abduction 4+/5 R, 4+/5 L (11/27/2020)    Time 8    Period Weeks    Status Achieved    Target Date 01/02/21      PT LONG TERM GOAL #3   Title Pt will improve her lumbar FOTO score by at least 10 points as a demonstration of improved function.    Baseline Lumbar spine FOTO 15 (11/07/2020); 94 (11/27/2020)    Time 8    Period Weeks  Status Achieved    Target Date 01/02/21                 Plan - 11/27/20 1107    Clinical Impression Statement Pt demonstrates improved hip strength, function, and significant decrease in back pain since initial evaluation. Pt has made very good progress with PT towards goals. Skilled physical therapy services discharged with pt continuing with her progress with her exercises at home.    Comorbidities Anxiety, hx of C-section    Stability/Clinical Decision Making Stable/Uncomplicated    Clinical Decision Making Low    Rehab Potential Fair    PT Treatment/Interventions Therapeutic activities;Therapeutic exercise;Functional mobility training;Neuromuscular re-education;Patient/family education;Manual techniques    PT Next Visit Plan Continue progress wiht HEP    PT Home Exercise Plan Medbridge Access Code [email or web address]    Consulted and Agree with Plan of Care Patient           Patient will benefit from skilled therapeutic intervention in order to improve the following deficits and impairments:     Visit Diagnosis: Acute left-sided low back pain, unspecified whether sciatica present  Muscle weakness (generalized)     Problem List Patient Active Problem List   Diagnosis Date Noted  . OSA on CPAP 09/23/2020  . Type 2 diabetes mellitus without complication, without long-term current use of insulin (HCC) 09/23/2020  . Lynch syndrome 06/12/2020  . Allergy with anaphylaxis due to food  06/12/2020  . Metabolic syndrome 06/12/2020  . Iron deficiency anemia 12/14/2019  . Hypothyroidism 10/04/2019  . Seborrheic dermatitis of scalp 10/04/2019  . Class 3 severe obesity with body mass index (BMI) of 40.0 to 44.9 in adult [facility]) 10/04/2019  . Prothrombin gene mutation (HCC) 10/04/2019  . S/P total hysterectomy and bilateral salpingo-oophorectomy 06/26/2019  . Hyperlipidemia 04/10/2019  . Essential hypertension 04/10/2019  . Prediabetes 04/10/2019  . Major depressive disorder with current active episode 04/10/2019  . Gastroesophageal reflux disease 04/10/2019  . Allergic rhinitis 04/10/2019    Thank you for your referral.  Loralyn Freshwater PT, DPT   11/27/2020, 11:35 AM  [place] [facility] PHYSICAL AND SPORTS MEDICINE [address] [place], [address] Phone: [phone]   Fax:  [phone]  Name: Arrin Ishler MRN: [id number] Date of Birth: 1975/08/18

## 2020-11-28 ENCOUNTER — Ambulatory Visit
Admission: RE | Admit: 2020-11-28 | Discharge: 2020-11-28 | Disposition: A | Discharge status: Home / Self Care | Payer: BC Managed Care – PPO | Source: Ambulatory Visit | Attending: Family Medicine | Admitting: Family Medicine

## 2020-11-28 DIAGNOSIS — K429 Umbilical hernia without obstruction or gangrene: Secondary | ICD-10-CM | POA: Diagnosis not present

## 2020-11-28 DIAGNOSIS — M5442 Lumbago with sciatica, left side: Secondary | ICD-10-CM | POA: Diagnosis not present

## 2020-11-28 DIAGNOSIS — R1032 Left lower quadrant pain: Secondary | ICD-10-CM | POA: Insufficient documentation

## 2020-11-28 DIAGNOSIS — K7689 Other specified diseases of liver: Secondary | ICD-10-CM | POA: Diagnosis not present

## 2020-11-28 DIAGNOSIS — N200 Calculus of kidney: Secondary | ICD-10-CM | POA: Diagnosis not present

## 2020-11-28 DIAGNOSIS — K449 Diaphragmatic hernia without obstruction or gangrene: Secondary | ICD-10-CM | POA: Diagnosis not present

## 2020-12-02 ENCOUNTER — Ambulatory Visit: Payer: BC Managed Care – PPO

## 2020-12-04 ENCOUNTER — Ambulatory Visit: Payer: BC Managed Care – PPO

## 2020-12-11 ENCOUNTER — Ambulatory Visit: Payer: BC Managed Care – PPO | Admitting: Family Medicine

## 2020-12-19 DIAGNOSIS — E785 Hyperlipidemia, unspecified: Secondary | ICD-10-CM | POA: Diagnosis not present

## 2020-12-19 DIAGNOSIS — Z1211 Encounter for screening for malignant neoplasm of colon: Secondary | ICD-10-CM | POA: Diagnosis not present

## 2020-12-19 DIAGNOSIS — K219 Gastro-esophageal reflux disease without esophagitis: Secondary | ICD-10-CM | POA: Diagnosis not present

## 2020-12-19 DIAGNOSIS — I81 Portal vein thrombosis: Secondary | ICD-10-CM | POA: Diagnosis not present

## 2020-12-19 DIAGNOSIS — E039 Hypothyroidism, unspecified: Secondary | ICD-10-CM | POA: Diagnosis not present

## 2020-12-19 DIAGNOSIS — Z86010 Personal history of colonic polyps: Secondary | ICD-10-CM | POA: Diagnosis not present

## 2020-12-19 DIAGNOSIS — K6289 Other specified diseases of anus and rectum: Secondary | ICD-10-CM | POA: Diagnosis not present

## 2020-12-19 DIAGNOSIS — Z7982 Long term (current) use of aspirin: Secondary | ICD-10-CM | POA: Diagnosis not present

## 2020-12-19 DIAGNOSIS — I10 Essential (primary) hypertension: Secondary | ICD-10-CM | POA: Diagnosis not present

## 2020-12-19 DIAGNOSIS — E282 Polycystic ovarian syndrome: Secondary | ICD-10-CM | POA: Diagnosis not present

## 2020-12-19 DIAGNOSIS — R7303 Prediabetes: Secondary | ICD-10-CM | POA: Diagnosis not present

## 2020-12-19 DIAGNOSIS — Z9049 Acquired absence of other specified parts of digestive tract: Secondary | ICD-10-CM | POA: Diagnosis not present

## 2020-12-19 DIAGNOSIS — E669 Obesity, unspecified: Secondary | ICD-10-CM | POA: Diagnosis not present

## 2020-12-19 DIAGNOSIS — D6852 Prothrombin gene mutation: Secondary | ICD-10-CM | POA: Diagnosis not present

## 2020-12-19 DIAGNOSIS — Z1509 Genetic susceptibility to other malignant neoplasm: Secondary | ICD-10-CM | POA: Diagnosis not present

## 2020-12-19 DIAGNOSIS — Z6841 Body Mass Index (BMI) 40.0 and over, adult: Secondary | ICD-10-CM | POA: Diagnosis not present

## 2020-12-24 ENCOUNTER — Ambulatory Visit: Payer: BC Managed Care – PPO | Admitting: Unknown Physician Specialty

## 2020-12-24 ENCOUNTER — Encounter: Payer: Self-pay | Admitting: Unknown Physician Specialty

## 2020-12-24 ENCOUNTER — Other Ambulatory Visit: Payer: Self-pay

## 2020-12-24 DIAGNOSIS — I10 Essential (primary) hypertension: Secondary | ICD-10-CM

## 2020-12-24 DIAGNOSIS — Z6841 Body Mass Index (BMI) 40.0 and over, adult: Secondary | ICD-10-CM

## 2020-12-24 DIAGNOSIS — I7090 Unspecified atherosclerosis: Secondary | ICD-10-CM | POA: Diagnosis not present

## 2020-12-24 DIAGNOSIS — K760 Fatty (change of) liver, not elsewhere classified: Secondary | ICD-10-CM | POA: Diagnosis not present

## 2020-12-24 DIAGNOSIS — R7303 Prediabetes: Secondary | ICD-10-CM

## 2020-12-24 MED ORDER — ATORVASTATIN CALCIUM 10 MG PO TABS: 10.0000 mg | ORAL_TABLET | Freq: Every day | ORAL | 3 refills | Status: DC

## 2020-12-24 NOTE — Assessment & Plan Note (Deleted)
st

## 2020-12-24 NOTE — Assessment & Plan Note (Addendum)
Start on Atorvastatin. 10 mg.  Statin risk/benefit discussed.

## 2020-12-24 NOTE — Progress Notes (Signed)
BP 124/84   Pulse 96   Temp 98.8 F (37.1 C) (Oral)   Resp 14   Ht 5\' 4"  (1.626 m)   Wt 261 lb 8 oz (118.6 kg)   LMP 05/23/2019 (Approximate)   SpO2 98%   BMI 44.89 kg/m    Subjective:    Patient ID: 13/04/2019, female    DOB: 1976/07/10, 45 y.o.   MRN: [id number]  HPI: Joanna Scott is a 45 y.o. female  Chief Complaint  Patient presents with   Advice Only    Discuss weight loss surgery   Follow-up    CT Scan   CT scan Pt with improved LLQ pain which is the reason for the CT scan.  That pain is better but a number of serendipitous findings she would like to discuss. These findings include fatty liver, atherosclerosis, ? CAD.  She is concerned about cancer with Turner's syndrome.    Weight loss Tried 2 different weight loss medications in addition to lifestyle changes.  Most recently tried 59 but caused significant Gastroperesis and diarrhea making her non-functional for 2 days.  Would like to consider weight loss surgery.    Relevant past medical, surgical, family and social history reviewed and updated as indicated. Interim medical history since our last visit reviewed. Allergies and medications reviewed and updated.  Review of Systems  Per HPI unless specifically indicated above     Objective:    BP 124/84   Pulse 96   Temp 98.8 F (37.1 C) (Oral)   Resp 14   Ht 5\' 4"  (1.626 m)   Wt 261 lb 8 oz (118.6 kg)   LMP 05/23/2019 (Approximate)   SpO2 98%   BMI 44.89 kg/m   Wt Readings from Last 3 Encounters:  12/24/20 261 lb 8 oz (118.6 kg)  11/04/20 255 lb 3.2 oz (115.8 kg)  09/02/20 238 lb (108 kg)    Physical Exam Constitutional:      General: She is not in acute distress.    Appearance: Normal appearance. She is well-developed.  HENT:     Head: Normocephalic and atraumatic.  Eyes:     General: Lids are normal. No scleral icterus.       Right eye: No discharge.        Left eye: No discharge.     Conjunctiva/sclera: Conjunctivae normal.  Neck:      Vascular: No carotid bruit or JVD.  Cardiovascular:     Rate and Rhythm: Normal rate and regular rhythm.     Heart sounds: Normal heart sounds.  Pulmonary:     Effort: Pulmonary effort is normal.     Breath sounds: Normal breath sounds.  Abdominal:     Palpations: There is no hepatomegaly or splenomegaly.  Musculoskeletal:        General: Normal range of motion.     Cervical back: Normal range of motion and neck supple.  Skin:    General: Skin is warm and dry.     Coloration: Skin is not pale.     Findings: No rash.  Neurological:     Mental Status: She is alert and oriented to person, place, and time.  Psychiatric:        Behavior: Behavior normal.        Thought Content: Thought content normal.        Judgment: Judgment normal.   EKG: NSR without any STTW changes.    Results for orders placed or performed in visit on 11/04/20  Urinalysis, Routine w reflex microscopic  Result Value Ref Range   Color, Urine YELLOW YELLOW   APPearance CLOUDY (A) CLEAR   Specific Gravity, Urine 1.020 1.001 - 1.035   pH < OR = 5.0 5.0 - 8.0   Glucose, UA NEGATIVE NEGATIVE   Bilirubin Urine NEGATIVE NEGATIVE   Ketones, ur NEGATIVE NEGATIVE   Hgb urine dipstick NEGATIVE NEGATIVE   Protein, ur NEGATIVE NEGATIVE   Nitrite NEGATIVE NEGATIVE   Leukocytes,Ua NEGATIVE NEGATIVE      Assessment & Plan:   Problem List Items Addressed This Visit       Unprioritized   Atherosclerosis    Start on Atorvastatin. 10 mg.  Statin risk/benefit discussed.         Relevant Medications   atorvastatin (LIPITOR) 10 MG tablet   Class 3 severe obesity with body mass index (BMI) of 40.0 to 44.9 in adult [facility])    Refer to weight loss surgery       Relevant Orders   Amb Referral to Bariatric Surgery   RESOLVED: Essential hypertension   Relevant Medications   atorvastatin (LIPITOR) 10 MG tablet   Fatty liver disease, nonalcoholic    Will follow liver enzymes.  If upward trend.  Will refer to  hepatology       Relevant Orders   Amb Referral to Bariatric Surgery   Comprehensive metabolic panel   Lipid panel   Prediabetes    Check Hgb A1C.  Call with results. Pt does not have DM and removed from the problem list       Relevant Orders   Hemoglobin A1c     Follow up plan: Return in about 3 months (around 03/26/2021).

## 2020-12-24 NOTE — Assessment & Plan Note (Signed)
Will follow liver enzymes.  If upward trend.  Will refer to hepatology

## 2020-12-24 NOTE — Assessment & Plan Note (Signed)
Refer to weight loss surgery

## 2020-12-24 NOTE — Assessment & Plan Note (Addendum)
Check Hgb A1C.  Call with results. Pt does not have DM and removed from the problem list

## 2020-12-25 LAB — LIPID PANEL
Cholesterol: 249 mg/dL — ABNORMAL HIGH (ref <200)
HDL: 37 mg/dL — ABNORMAL LOW (ref >=50)
LDL Cholesterol (Calc): 171 mg/dL (calc) — ABNORMAL HIGH
Non-HDL Cholesterol (Calc): 212 mg/dL (calc) — ABNORMAL HIGH (ref <130)
Total CHOL/HDL Ratio: 6.7 (calc) — ABNORMAL HIGH (ref <5.0)
Triglycerides: 242 mg/dL — ABNORMAL HIGH (ref <150)

## 2020-12-25 LAB — HEMOGLOBIN A1C
Hgb A1c MFr Bld: 5.9 % of total Hgb — ABNORMAL HIGH (ref <5.7)
Mean Plasma Glucose: 123 mg/dL
eAG (mmol/L): 6.8 mmol/L

## 2020-12-25 LAB — COMPREHENSIVE METABOLIC PANEL
AG Ratio: 1.6 (calc) (ref 1.0–2.5)
ALT: 47 U/L — ABNORMAL HIGH (ref 6–29)
AST: 31 U/L — ABNORMAL HIGH (ref 10–30)
Albumin: 4.6 g/dL (ref 3.6–5.1)
Alkaline phosphatase (APISO): 90 U/L (ref 31–125)
BUN: 13 mg/dL (ref 7–25)
CO2: 29 mmol/L (ref 20–32)
Calcium: 10.1 mg/dL (ref 8.6–10.2)
Chloride: 102 mmol/L (ref 98–110)
Creat: 0.95 mg/dL (ref 0.50–1.10)
Globulin: 2.8 g/dL (calc) (ref 1.9–3.7)
Glucose, Bld: 118 mg/dL — ABNORMAL HIGH (ref 65–99)
Potassium: 4.5 mmol/L (ref 3.5–5.3)
Sodium: 139 mmol/L (ref 135–146)
Total Bilirubin: 0.5 mg/dL (ref 0.2–1.2)
Total Protein: 7.4 g/dL (ref 6.1–8.1)

## 2020-12-28 ENCOUNTER — Other Ambulatory Visit: Payer: Self-pay | Admitting: Family Medicine

## 2020-12-28 NOTE — Telephone Encounter (Signed)
Requested Prescriptions  Pending Prescriptions Disp Refills  . TRINTELLIX 20 MG TABS tablet [Pharmacy Med Name: TRINTELLIX 20 MG TABLET] 90 tablet 0    Sig: TAKE 1 TABLET BY MOUTH EVERY DAY     Psychiatry: Antidepressants - Serotonin Modulator Passed - 12/28/2020 12:47 PM      Passed - Completed PHQ-2 or PHQ-9 in the last 360 days      Passed - Valid encounter within last 6 months    Recent Outpatient Visits          4 days ago Essential hypertension   [facility] [place], Elnita Maxwell, NP   1 month ago Major depressive disorder with current active episode, unspecified depression episode severity, unspecified whether recurrent   [facility] Danelle Berry, PA-C   3 months ago Essential hypertension   [facility], Sheliah Mends, PA-C   6 months ago Class 3 severe obesity with body mass index (BMI) of 40.0 to 44.9 in adult, unspecified obesity type, unspecified whether serious comorbidity present [facility])   [facility] Danelle Berry, PA-C   8 months ago Class 3 severe obesity with body mass index (BMI) of 40.0 to 44.9 in adult, unspecified obesity type, unspecified whether serious comorbidity present [facility])   [facility] Danelle Berry, PA-C      Future Appointments            In 2 months Danelle Berry, PA-C [facility], [facility]   In 4 months Creig Hines, MD [facility] Medical Oncology

## 2021-01-03 ENCOUNTER — Inpatient Hospital Stay: Payer: BC Managed Care – PPO | Attending: Oncology

## 2021-03-07 ENCOUNTER — Other Ambulatory Visit: Payer: Self-pay | Admitting: Family Medicine

## 2021-03-20 ENCOUNTER — Other Ambulatory Visit: Payer: Self-pay | Admitting: Family Medicine

## 2021-03-27 ENCOUNTER — Encounter: Payer: Self-pay | Admitting: Family Medicine

## 2021-03-27 ENCOUNTER — Other Ambulatory Visit: Payer: Self-pay

## 2021-03-27 ENCOUNTER — Encounter: Payer: Self-pay | Admitting: Oncology

## 2021-03-27 ENCOUNTER — Ambulatory Visit (INDEPENDENT_AMBULATORY_CARE_PROVIDER_SITE_OTHER): Payer: 59 | Admitting: Family Medicine

## 2021-03-27 VITALS — BP 126/78 | HR 82 | Temp 98.2°F | Resp 16 | Ht 64.0 in | Wt 273.6 lb

## 2021-03-27 DIAGNOSIS — I7090 Unspecified atherosclerosis: Secondary | ICD-10-CM

## 2021-03-27 DIAGNOSIS — E785 Hyperlipidemia, unspecified: Secondary | ICD-10-CM

## 2021-03-27 DIAGNOSIS — F329 Major depressive disorder, single episode, unspecified: Secondary | ICD-10-CM

## 2021-03-27 DIAGNOSIS — E039 Hypothyroidism, unspecified: Secondary | ICD-10-CM

## 2021-03-27 DIAGNOSIS — Z23 Encounter for immunization: Secondary | ICD-10-CM | POA: Diagnosis not present

## 2021-03-27 DIAGNOSIS — E8881 Metabolic syndrome: Secondary | ICD-10-CM

## 2021-03-27 DIAGNOSIS — Z5181 Encounter for therapeutic drug level monitoring: Secondary | ICD-10-CM

## 2021-03-27 DIAGNOSIS — Z6841 Body Mass Index (BMI) 40.0 and over, adult: Secondary | ICD-10-CM

## 2021-03-27 DIAGNOSIS — Z1159 Encounter for screening for other viral diseases: Secondary | ICD-10-CM

## 2021-03-27 DIAGNOSIS — I10 Essential (primary) hypertension: Secondary | ICD-10-CM

## 2021-03-27 DIAGNOSIS — E282 Polycystic ovarian syndrome: Secondary | ICD-10-CM

## 2021-03-27 NOTE — Progress Notes (Signed)
Name: Joanna Scott   MRN: [id number]    DOB: 06/15/1976   Date:03/27/2021       Progress Note  Chief Complaint  Patient presents with   Hypertension   Depression   Hyperlipidemia     Subjective:   Joanna Scott is a 45 y.o. female, presents to clinic for HLD f/up after starting statin  Also concerned about weight - hx of prediabetes, metabolic syndrome, PCOS  Was on metformin and saxenda but stopped meds since I saw her last in April this year - developed severe SE and intolerance to saxenda even with lowering the dose, weight increased significantly w/o change to her diet/calorie intake exercise Denied for bariatric consult or surgery coverage per her insurance They do have a diet program that she is going to try  Wt Readings from Last 10 Encounters:  03/27/21 273 lb 9.6 oz (124.1 kg)  12/24/20 261 lb 8 oz (118.6 kg)  11/04/20 255 lb 3.2 oz (115.8 kg)  09/02/20 238 lb (108 kg)  06/12/20 238 lb 3.2 oz (108 kg)  04/08/20 239 lb 14.4 oz (108.8 kg)  02/13/20 243 lb 3.2 oz (110.3 kg)  12/14/19 252 lb 11.2 oz (114.6 kg)  11/29/19 251 lb 1.6 oz (113.9 kg)  11/10/19 253 lb 6.4 oz (114.9 kg)   BMI Readings from Last 5 Encounters:  03/27/21 46.96 kg/m  12/24/20 44.89 kg/m  11/04/20 43.80 kg/m  09/02/20 40.85 kg/m  06/12/20 40.89 kg/m   Prediabetes last A1C was 5.9 - but since stopped metformin and saxenda  Hx of hypothyroid, previously on meds, last labs TSH was normal  Hyperlipidemia: Currently treated with lipitor 10 mg pt reports good med compliance, no SE or concerns since starting meds  Last Lipids: Lab Results  Component Value Date   CHOL 249 (H) 12/24/2020   HDL 37 (L) 12/24/2020   LDLCALC 171 (H) 12/24/2020   TRIG 242 (H) 12/24/2020   CHOLHDL 6.7 (H) 12/24/2020   - Denies: Chest pain, shortness of breath, myalgias, claudication      Current Outpatient Medications:    aspirin EC 81 MG tablet, Take 81 mg by mouth daily., Disp: , Rfl:    atorvastatin  (LIPITOR) 10 MG tablet, Take 1 tablet (10 mg total) by mouth daily., Disp: 90 tablet, Rfl: 3   cetirizine (ZYRTEC) 10 MG tablet, Take 10 mg by mouth daily. , Disp: , Rfl:    EPINEPHrine 0.3 mg/0.3 mL IJ SOAJ injection, Inject 0.3 mg into the muscle as needed for anaphylaxis., Disp: 1 each, Rfl: 0   ketoconazole (NIZORAL) 2 % shampoo, Apply 1 application topically 2 (two) times a week., Disp: 120 mL, Rfl: 2   pantoprazole (PROTONIX) 40 MG tablet, TAKE 1 TABLET BY MOUTH EVERY DAY IN THE MORNING, Disp: 90 tablet, Rfl: 1   TRINTELLIX 20 MG TABS tablet, TAKE 1 TABLET BY MOUTH EVERY DAY, Disp: 90 tablet, Rfl: 0  Patient Active Problem List   Diagnosis Date Noted   Fatty liver disease, nonalcoholic 12/24/2020   Atherosclerosis 12/24/2020   OSA on CPAP 09/23/2020   Lynch syndrome 06/12/2020   Allergy with anaphylaxis due to food 06/12/2020   Metabolic syndrome 06/12/2020   Iron deficiency anemia 12/14/2019   Hypothyroidism 10/04/2019   Seborrheic dermatitis of scalp 10/04/2019   Class 3 severe obesity with body mass index (BMI) of 40.0 to 44.9 in adult [facility]) 10/04/2019   Prothrombin gene mutation (HCC) 10/04/2019   S/P total hysterectomy and bilateral salpingo-oophorectomy 06/26/2019   Hyperlipidemia  04/10/2019   Prediabetes 04/10/2019   Major depressive disorder with current active episode 04/10/2019   Allergic rhinitis 04/10/2019    Past Surgical History:  Procedure Laterality Date   CESAREAN SECTION     CESAREAN SECTION N/A 12/17/2014   Procedure: CESAREAN SECTION;  Surgeon: Conard Novak, MD;  Location: [facility] ORS;  Service: Obstetrics;  Laterality: N/A;   CYSTOSCOPY  06/26/2019   Procedure: CYSTOSCOPY;  Surgeon: Ward, Elenora Fender, MD;  Location: [facility] ORS;  Service: Gynecology;;   CYSTOSCOPY WITH STENT PLACEMENT Right 06/26/2019   Procedure: CYSTOSCOPY WITH STENT PLACEMENT, Cystotomy;  Surgeon: Ward, Elenora Fender, MD;  Location: [facility] ORS;  Service: Gynecology;  Laterality: Right;    DIAGNOSTIC LAPAROSCOPY     DILATION AND CURETTAGE OF UTERUS     LAPAROSCOPIC CHOLECYSTECTOMY     Lynch syndrome     ROBOTIC ASSISTED TOTAL HYSTERECTOMY WITH BILATERAL SALPINGO OOPHERECTOMY Bilateral 06/26/2019   Procedure: XI ROBOTIC ASSISTED TOTAL HYSTERECTOMY WITH BILATERAL SALPINGO OOPHORECTOMY;  Surgeon: Ward, Elenora Fender, MD;  Location: [facility] ORS;  Service: Gynecology;  Laterality: Bilateral;    Family History  Problem Relation Age of Onset   Malignant hyperthermia Cousin    Deep vein thrombosis Maternal Grandmother    Hypertension Mother    Depression Mother    COPD Mother    Ovarian cancer Sister    Stroke Maternal Grandfather    Heart attack Maternal Grandfather    Malignant hyperthermia Other    Encopresis Son     Social History   Tobacco Use   Smoking status: Former    Packs/day: 1.00    Years: 0.00    Pack years: 0.00    Types: Cigarettes   Smokeless tobacco: Never   Tobacco comments:    pt stopped at age 76  Vaping Use   Vaping Use: Never used  Substance Use Topics   Alcohol use: Yes    Comment: 2 times a year for special occasions   Drug use: No     Allergies  Allergen Reactions   Peach [Prunus Persica] Anaphylaxis and Nausea And Vomiting   Tree Extract Itching    Sneezing and itchy eyes [facility]let[facility] Maintenance  Topic Date Due   PNEUMOCOCCAL POLYSACCHARIDE VACCINE AGE 28-64 HIGH RISK  Never done   Pneumococcal Vaccine 60-49 Years old (1 - PCV) Never done   FOOT EXAM  Never done   OPHTHALMOLOGY EXAM  Never done   URINE MICROALBUMIN  Never done   Hepatitis C Screening  Never done   COVID-19 Vaccine (3 - Pfizer risk series) 11/08/2019   HEMOGLOBIN A1C  06/25/2021   TETANUS/TDAP  07/13/2024   INFLUENZA VACCINE  Completed   HIV Screening  Completed   HPV VACCINES  Aged Out   PAP SMEAR-Modifier  Discontinued    Chart Review Today: I personally reviewed active problem list, medication list, allergies, family history, social history,  health maintenance, notes from last encounter, lab results, imaging with the patient/caregiver today.   Review of Systems  Constitutional:  Positive for unexpected weight change. Negative for activity change and appetite change.  HENT: Negative.    Eyes: Negative.   Respiratory: Negative.    Cardiovascular: Negative.   Gastrointestinal: Negative.   Endocrine: Negative.   Genitourinary: Negative.   Musculoskeletal: Negative.   Skin: Negative.   Allergic/Immunologic: Negative.   Neurological: Negative.   Hematological: Negative.   Psychiatric/Behavioral: Negative.    All other systems reviewed and are negative.  Objective:   Vitals:   03/27/21 0830  BP: 126/78  Pulse: 82  Resp: 16  Temp: 98.2 F (36.8 C)  SpO2: 97%  Weight: 273 lb 9.6 oz (124.1 kg)  Height: 5\' 4"  (1.626 m)    Body mass index is 46.96 kg/m.  Physical Exam Vitals and nursing note reviewed.  Constitutional:      General: She is not in acute distress.    Appearance: Normal appearance. She is well-developed and well-groomed. She is morbidly obese. She is not ill-appearing, toxic-appearing or diaphoretic.     Interventions: Face mask in place.  HENT:     Head: Normocephalic and atraumatic.     Right Ear: External ear normal.     Left Ear: External ear normal.  Eyes:     General:        Right eye: No discharge.        Left eye: No discharge.     Conjunctiva/sclera: Conjunctivae normal.  Neck:     Trachea: No tracheal deviation.  Cardiovascular:     Rate and Rhythm: Normal rate and regular rhythm.     Pulses: Normal pulses.     Heart sounds: Normal heart sounds.  Pulmonary:     Effort: Pulmonary effort is normal. No respiratory distress.     Breath sounds: Normal breath sounds. No stridor.  Skin:    General: Skin is warm and dry.     Coloration: Skin is not pale.     Findings: No rash.  Neurological:     Mental Status: She is alert. Mental status is at baseline.     Motor: No abnormal muscle  tone.     Coordination: Coordination normal.  Psychiatric:        Mood and Affect: Mood normal.        Behavior: Behavior normal. Behavior is cooperative.        Assessment & Plan:   1. Essential hypertension Not on meds currently, BP at goal today 126/78, continue to monitor and work on DASH  - COMPLETE METABOLIC PANEL WITH GFR  2. Major depressive disorder with current active episode, unspecified depression episode severity, unspecified whether recurrent Doing well with trintellix Depression screen [facility] 2/9 03/27/2021 12/24/2020 11/04/2020  Decreased Interest 1 1 1   Down, Depressed, Hopeless 1 1 1   PHQ - 2 Score 2 2 2   Altered sleeping 0 0 0  Tired, decreased energy 1 0 1  Change in appetite 1 0 1  Feeling bad or failure about yourself  1 0 0  Trouble concentrating 1 0 3  Moving slowly or fidgety/restless 0 0 0  Suicidal thoughts 0 0 0  PHQ-9 Score 6 2 7   Difficult doing work/chores Somewhat difficult Not difficult at all Somewhat difficult  Some recent data might be hidden  Slightly higher score today on phq when reviewed, no SI, she is worried about weight increase and overall feeling not healthy   3. Class 3 severe obesity with serious comorbidity and body mass index (BMI) of 40.0 to 44.9 in adult, unspecified obesity type (HCC) Significant weight increase over the past 5 months w/o change to diet exercise Of note she stopped both metformin and saxenda Hx of metabolic syndrome, PCOS and hypothyroid Recheck labs, consider restarting thyroid meds if TSH is out of range or higher end of normal range Consider restarting metformin and/or trulicity or ozempic to see if it helps and if pt tolerates - COMPLETE METABOLIC PANEL WITH GFR - Hemoglobin A1c -TSH  4. Need for influenza vaccination  - Flu Vaccine QUAD 6+ mos PF IM (Fluarix Quad PF)  5. Atherosclerosis Started statin, monitoring - Lipid panel - COMPLETE METABOLIC PANEL WITH GFR  6. Encounter for hepatitis C  screening test for low risk patient  - Hepatitis C Antibody  7. Encounter for medication monitoring Lipids, CMP, A1C, TSH Doing well with new statin Stopped saxenda due to severe GI SE  8. Hypothyroidism, unspecified type Recheck labs, may need to restart meds with significant weight gain, slightly worse mood and energy - TSH  9. Metabolic syndrome Known hx of metabolic syndrome with increased waist circ., HLD, prediabetes, insulin resistance and PCOS with elevated testosterone - recheck labs Referral to medical weight management - Lipid panel - Testosterone - Insulin, Free (Bioactive) - COMPLETE METABOLIC PANEL WITH GFR - Hemoglobin A1c  10. Hyperlipidemia, unspecified hyperlipidemia type Started statin, tolerating low dose lipitor, due for recheck of labs - would like to see good % reduction in LDL ~30% + if not then increase dose to 20 mg - Lipid panel - COMPLETE METABOLIC PANEL WITH GFR  11. PCOS (polycystic ovarian syndrome) See above - Testosterone - Insulin, Free (Bioactive)   F/up in 4 months - expect to f/up on weight and restarting meds - metformin, possibly levothyroxine or trulicity  Danelle Berry, PA-C 03/27/21 8:41 AM

## 2021-03-28 ENCOUNTER — Other Ambulatory Visit: Payer: Self-pay | Admitting: Family Medicine

## 2021-03-28 DIAGNOSIS — R7303 Prediabetes: Secondary | ICD-10-CM

## 2021-03-28 DIAGNOSIS — E039 Hypothyroidism, unspecified: Secondary | ICD-10-CM

## 2021-03-28 MED ORDER — METFORMIN HCL ER 500 MG PO TB24: 1000.0000 mg | ORAL_TABLET | Freq: Two times a day (BID) | ORAL | 3 refills | Status: DC

## 2021-03-28 NOTE — Progress Notes (Signed)
TSH normal, adding on Free T4 Refill on metformin

## 2021-04-03 LAB — HEMOGLOBIN A1C
Hgb A1c MFr Bld: 6.8 % of total Hgb — ABNORMAL HIGH (ref <5.7)
Mean Plasma Glucose: 148 mg/dL
eAG (mmol/L): 8.2 mmol/L

## 2021-04-03 LAB — COMPLETE METABOLIC PANEL WITH GFR
AG Ratio: 1.8 (calc) (ref 1.0–2.5)
ALT: 48 U/L — ABNORMAL HIGH (ref 6–29)
AST: 28 U/L (ref 10–30)
Albumin: 4.3 g/dL (ref 3.6–5.1)
Alkaline phosphatase (APISO): 97 U/L (ref 31–125)
BUN: 16 mg/dL (ref 7–25)
CO2: 26 mmol/L (ref 20–32)
Calcium: 9.5 mg/dL (ref 8.6–10.2)
Chloride: 104 mmol/L (ref 98–110)
Creat: 0.81 mg/dL (ref 0.50–0.99)
Globulin: 2.4 g/dL (calc) (ref 1.9–3.7)
Glucose, Bld: 126 mg/dL — ABNORMAL HIGH (ref 65–99)
Potassium: 4.2 mmol/L (ref 3.5–5.3)
Sodium: 140 mmol/L (ref 135–146)
Total Bilirubin: 0.5 mg/dL (ref 0.2–1.2)
Total Protein: 6.7 g/dL (ref 6.1–8.1)
eGFR: 92 mL/min/{1.73_m2} (ref >=60)

## 2021-04-03 LAB — LIPID PANEL
Cholesterol: 147 mg/dL (ref <200)
HDL: 37 mg/dL — ABNORMAL LOW (ref >=50)
LDL Cholesterol (Calc): 87 mg/dL (calc)
Non-HDL Cholesterol (Calc): 110 mg/dL (calc) (ref <130)
Total CHOL/HDL Ratio: 4 (calc) (ref <5.0)
Triglycerides: 126 mg/dL (ref <150)

## 2021-04-03 LAB — TSH: TSH: 2.19 mIU/L

## 2021-04-03 LAB — INSULIN, FREE (BIOACTIVE): Insulin, Free: 79.4 u[IU]/mL — ABNORMAL HIGH (ref 1.5–14.9)

## 2021-04-03 LAB — TEST AUTHORIZATION

## 2021-04-03 LAB — HEPATITIS C ANTIBODY
Hepatitis C Ab: NONREACTIVE
SIGNAL TO CUT-OFF: 0.01 (ref <1.00)

## 2021-04-03 LAB — TESTOSTERONE, TOTAL, LC/MS/MS: Testosterone, Total, LC-MS-MS: 4 ng/dL (ref 2–45)

## 2021-04-03 LAB — T4, FREE: Free T4: 1.1 ng/dL (ref 0.8–1.8)

## 2021-04-08 ENCOUNTER — Encounter: Payer: Self-pay | Admitting: Family Medicine

## 2021-04-08 DIAGNOSIS — E039 Hypothyroidism, unspecified: Secondary | ICD-10-CM

## 2021-04-08 DIAGNOSIS — E785 Hyperlipidemia, unspecified: Secondary | ICD-10-CM

## 2021-04-08 DIAGNOSIS — K760 Fatty (change of) liver, not elsewhere classified: Secondary | ICD-10-CM

## 2021-04-08 DIAGNOSIS — E282 Polycystic ovarian syndrome: Secondary | ICD-10-CM

## 2021-04-08 DIAGNOSIS — E8881 Metabolic syndrome: Secondary | ICD-10-CM

## 2021-04-08 DIAGNOSIS — E119 Type 2 diabetes mellitus without complications: Secondary | ICD-10-CM

## 2021-04-21 NOTE — Addendum Note (Signed)
Addended by: Danelle Berry on: 04/21/2021 05:41 PM   Modules accepted: Orders

## 2021-04-24 ENCOUNTER — Other Ambulatory Visit: Payer: Self-pay | Admitting: Family Medicine

## 2021-04-24 DIAGNOSIS — L219 Seborrheic dermatitis, unspecified: Secondary | ICD-10-CM

## 2021-04-24 NOTE — Telephone Encounter (Signed)
Requested Prescriptions  Pending Prescriptions Disp Refills  . ketoconazole (NIZORAL) 2 % shampoo [Pharmacy Med Name: KETOCONAZOLE 2% SHAMPOO] 120 mL 2    Sig: APPLY 1 APPLICATION TOPICALLY 2 (TWO) TIMES A WEEK.     Over the Counter:  OTC Passed - 04/24/2021  3:51 AM      Passed - Valid encounter within last 12 months    Recent Outpatient Visits          4 weeks ago Essential hypertension   [facility] Danelle Berry, PA-C   4 months ago Essential hypertension   [facility] [place], [place], NP   5 months ago Major depressive disorder with current active episode, unspecified depression episode severity, unspecified whether recurrent   [facility] Danelle Berry, PA-C   7 months ago Essential hypertension   [facility] Danelle Berry, PA-C   10 months ago Class 3 severe obesity with body mass index (BMI) of 40.0 to 44.9 in adult, unspecified obesity type, unspecified whether serious comorbidity present [facility])   [facility] Danelle Berry, PA-C      Future Appointments            In 1 week Creig Hines, MD [facility] Oncology   In 3 months Danelle Berry, PA-C [facility]

## 2021-05-05 ENCOUNTER — Other Ambulatory Visit: Payer: Self-pay

## 2021-05-05 ENCOUNTER — Other Ambulatory Visit: Payer: BLUE CROSS/BLUE SHIELD

## 2021-05-05 ENCOUNTER — Inpatient Hospital Stay: Payer: Self-pay

## 2021-05-05 DIAGNOSIS — D509 Iron deficiency anemia, unspecified: Secondary | ICD-10-CM

## 2021-05-05 DIAGNOSIS — Z7982 Long term (current) use of aspirin: Secondary | ICD-10-CM | POA: Insufficient documentation

## 2021-05-05 DIAGNOSIS — Z98891 History of uterine scar from previous surgery: Secondary | ICD-10-CM | POA: Insufficient documentation

## 2021-05-05 DIAGNOSIS — Z7984 Long term (current) use of oral hypoglycemic drugs: Secondary | ICD-10-CM | POA: Insufficient documentation

## 2021-05-05 DIAGNOSIS — Z86718 Personal history of other venous thrombosis and embolism: Secondary | ICD-10-CM | POA: Insufficient documentation

## 2021-05-05 DIAGNOSIS — Z79899 Other long term (current) drug therapy: Secondary | ICD-10-CM | POA: Insufficient documentation

## 2021-05-05 LAB — CBC WITH DIFFERENTIAL/PLATELET
Abs Immature Granulocytes: 0.05 10*3/uL (ref 0.00–0.07)
Basophils Absolute: 0.1 10*3/uL (ref 0.0–0.1)
Basophils Relative: 1 %
Eosinophils Absolute: 0.2 10*3/uL (ref 0.0–0.5)
Eosinophils Relative: 2 %
HCT: 38.6 % (ref 36.0–46.0)
Hemoglobin: 12.8 g/dL (ref 12.0–15.0)
Immature Granulocytes: 1 %
Lymphocytes Relative: 29 %
Lymphs Abs: 2 10*3/uL (ref 0.7–4.0)
MCH: 27.4 pg (ref 26.0–34.0)
MCHC: 33.2 g/dL (ref 30.0–36.0)
MCV: 82.7 fL (ref 80.0–100.0)
Monocytes Absolute: 0.4 10*3/uL (ref 0.1–1.0)
Monocytes Relative: 6 %
Neutro Abs: 4.4 10*3/uL (ref 1.7–7.7)
Neutrophils Relative %: 61 %
Platelets: 214 10*3/uL (ref 150–400)
RBC: 4.67 MIL/uL (ref 3.87–5.11)
RDW: 13.3 % (ref 11.5–15.5)
WBC: 7.1 10*3/uL (ref 4.0–10.5)
nRBC: 0 % (ref 0.0–0.2)

## 2021-05-05 LAB — IRON AND TIBC
Iron: 43 ug/dL (ref 28–170)
Saturation Ratios: 12 % (ref 10.4–31.8)
TIBC: 365 ug/dL (ref 250–450)
UIBC: 322 ug/dL

## 2021-05-05 LAB — FERRITIN: Ferritin: 66 ng/mL (ref 11–307)

## 2021-05-06 ENCOUNTER — Encounter: Payer: Self-pay | Admitting: Nurse Practitioner

## 2021-05-06 ENCOUNTER — Inpatient Hospital Stay: Payer: Self-pay | Attending: Oncology | Admitting: Nurse Practitioner

## 2021-05-06 DIAGNOSIS — D509 Iron deficiency anemia, unspecified: Secondary | ICD-10-CM

## 2021-05-06 NOTE — Progress Notes (Signed)
Virtual Visit Progress Note  I connected with Joanna Scott on 05/06/21 at  2:30 PM EDT by video enabled telemedicine visit and verified that I am speaking with the correct person using two identifiers.   I discussed the limitations, risks, security and privacy concerns of performing an evaluation and management service by telemedicine and the availability of in-person appointments. I also discussed with the patient that there may be a patient responsible charge related to this service. The patient expressed understanding and agreed to proceed.   Other persons participating in the visit and their role in the encounter: none   Patient's location: car- not driving  Provider's location: clinic   Chief Complaint: Iron Deficiency  History of present illness: Patient is a 45 year old female referred to [place] for iron deficiency anemia.  She has a history of Lynch syndrome and has regular follow-up with GI, GYN and dermatology.  Her recent CBC from 11/10/2019 showed white count of 8.8, H&H of 10.9/35.3 with an MCV of 71 and a platelet count of 274.  Iron study showed a low ferritin of 17 and low iron saturation of 6% and elevated TIBC of 461.  Looking back at her prior CBCs patient has had chronic microcytic anemia and over the last 6 months her hemoglobin has been around 10.  TSH in March 2021 was normal. Patient hasd tried oral iron but unable to tolerate due to constipation. She is s/p hysterectomy. Patient sees [facility] and has had EGD and colonoscopy in feb 2021 which did not show any bleeding. Noted to have multiple gastric polyps which were consistent with fundic gland polyps. No evidence of dysplasia   Patient is heterozygous for prothrombin gene mutation. She developed portal venous thrombosis in 2006. She was on birth control at that time. She was on anticoagulation for about 8 months then stopped. She takes prophylactic anticoagulation prior to flight trips. She has not had any thromboembolic episodes  since. Prior to portal venous thrombosis she has gone through C sections without any complications.    Interval history: Patient returns to clinic for discussion of lab results and follow-up for history of iron deficiency.  She continues to feel well.  Denies any neurologic complaints. Denies recent fevers or illnesses. Denies any easy bleeding or bruising. No melena or hematochezia. No pica or restless leg. Reports good appetite and denies weight loss. Denies chest pain. Denies any nausea, vomiting, constipation, or diarrhea. Denies urinary complaints. Patient offers no further specific complaints today.  She does not take oral iron due to gastric side effects.  Review of Systems  Constitutional:  Negative for chills, fever, malaise/fatigue and weight loss.  HENT:  Negative for congestion, ear discharge and nosebleeds.   Eyes:  Negative for blurred vision.  Respiratory:  Negative for cough, hemoptysis, sputum production, shortness of breath and wheezing.   Cardiovascular:  Negative for chest pain, palpitations, orthopnea and claudication.  Gastrointestinal:  Negative for abdominal pain, blood in stool, constipation, diarrhea, heartburn, melena, nausea and vomiting.  Genitourinary:  Negative for dysuria, flank pain, frequency, hematuria and urgency.  Musculoskeletal:  Negative for back pain, joint pain and myalgias.  Skin:  Negative for rash.  Neurological:  Negative for dizziness, tingling, focal weakness, seizures, weakness and headaches.  Endo/Heme/Allergies:  Does not bruise/bleed easily.  Psychiatric/Behavioral:  Negative for depression and suicidal ideas. The patient does not have insomnia.    Allergies  Allergen Reactions   Peach [Prunus Persica] Anaphylaxis and Nausea And Vomiting   Tree Extract Itching  Sneezing and itchy eyes [facility]    Past Medical History:  Diagnosis Date   Anemia    Anemia 04/10/2019   Anxiety    Bilateral polycystic ovarian syndrome 10/20/2012    Family history of adverse reaction to anesthesia    malignant hyperthermia in 2 first cousins    Family history of malignant hyperthermia    GERD (gastroesophageal reflux disease)    Hypothyroidism    Microcytosis 10/04/2019   Obesity affecting pregnancy    PCOS (polycystic ovarian syndrome)    Portal vein thrombosis    Postpartum care following cesarean delivery 12/19/2014   Preeclampsia in postpartum period    Pregnancy    Previous cesarean delivery, delivered 12/16/2014   Previous cesarean section    Prothrombin gene mutation (HCC)    S/P cesarean section 12/17/2014   Scalp psoriasis    Sleep apnea     Past Surgical History:  Procedure Laterality Date   CESAREAN SECTION     CESAREAN SECTION N/A 12/17/2014   Procedure: CESAREAN SECTION;  Surgeon: Conard Novak, MD;  Location: [facility] ORS;  Service: Obstetrics;  Laterality: N/A;   CYSTOSCOPY  06/26/2019   Procedure: CYSTOSCOPY;  Surgeon: Ward, Elenora Fender, MD;  Location: [facility] ORS;  Service: Gynecology;;   CYSTOSCOPY WITH STENT PLACEMENT Right 06/26/2019   Procedure: CYSTOSCOPY WITH STENT PLACEMENT, Cystotomy;  Surgeon: Ward, Elenora Fender, MD;  Location: [facility] OR[facility];  Service: Gynecology;  Laterality: Right;   DIAGNOSTIC LAPAROSCOPY     DILATION AND CURETTAGE OF UTERUS     LAPAROSCOPIC CHOLECYSTECTOMY     Lynch syndrome     ROBOTIC ASSISTED TOTAL HYSTERECTOMY WITH BILATERAL SALPINGO OOPHERECTOMY Bilateral 06/26/2019   Procedure: XI ROBOTIC ASSISTED TOTAL HYSTERECTOMY WITH BILATERAL SALPINGO OOPHORECTOMY;  Surgeon: Ward, Elenora Fender, MD;  Location: [facility] ORS;  Service: Gynecology;  Laterality: Bilateral;    Social History   Socioeconomic History   Marital status: Married    Spouse name: Chrissie Noa   Number of children: 2   Years of education: 12   Highest education level: Bachelor's degree (e.g., BA, AB, BS)  Occupational History   Occupation: [employer]  Tobacco Use   Smoking status: Former    Packs/day: 1.00    Years: 0.00    Pack years:  0.00    Types: Cigarettes   Smokeless tobacco: Never   Tobacco comments:    pt stopped at age 53  Vaping Use   Vaping Use: Never used  Substance and Sexual Activity   Alcohol use: Yes    Comment: 2 times a year for special occasions   Drug use: No   Sexual activity: Yes    Birth control/protection: None  Other Topics Concern   Not on file  Social History Narrative   Not on file   Social Determinants of Health   Financial Resource Strain: Not on file  Food Insecurity: Not on file  Transportation Needs: Not on file  Physical Activity: Not on file  Stress: Not on file  Social Connections: Not on file  Intimate Partner Violence: Not on file    Family History  Problem Relation Age of Onset   Malignant hyperthermia Cousin    Deep vein thrombosis Maternal Grandmother    Hypertension Mother    Depression Mother    COPD Mother    Ovarian cancer Sister    Stroke Maternal Grandfather    Heart attack Maternal Grandfather    Malignant hyperthermia Other    Encopresis Son  Current Outpatient Medications:    aspirin EC 81 MG tablet, Take 81 mg by mouth daily., Disp: , Rfl:    atorvastatin (LIPITOR) 10 MG tablet, Take 1 tablet (10 mg total) by mouth daily., Disp: 90 tablet, Rfl: 3   cetirizine (ZYRTEC) 10 MG tablet, Take 10 mg by mouth daily. , Disp: , Rfl:    EPINEPHrine 0.3 mg/0.3 mL IJ SOAJ injection, Inject 0.3 mg into the muscle as needed for anaphylaxis., Disp: 1 each, Rfl: 0   ketoconazole (NIZORAL) 2 % shampoo, APPLY 1 APPLICATION TOPICALLY 2 (TWO) TIMES A WEEK., Disp: 120 mL, Rfl: 2   metFORMIN (GLUCOPHAGE-XR) 500 MG 24 hr tablet, Take 2 tablets (1,000 mg total) by mouth 2 (two) times daily with a meal., Disp: 360 tablet, Rfl: 3   pantoprazole (PROTONIX) 40 MG tablet, TAKE 1 TABLET BY MOUTH EVERY DAY IN THE MORNING, Disp: 90 tablet, Rfl: 1   TRINTELLIX 20 MG TABS tablet, TAKE 1 TABLET BY MOUTH EVERY DAY, Disp: 90 tablet, Rfl: 0  No results  found.   Observation/objective:  Physical Exam Constitutional:      Appearance: She is not ill-appearing.  Pulmonary:     Effort: No respiratory distress.  Neurological:     Mental Status: She is alert and oriented to person, place, and time.  Psychiatric:        Mood and Affect: Mood normal.        Behavior: Behavior normal.    CMP Latest Ref Rng & Units 03/27/2021  Glucose 65 - 99 mg/dL 449(Q)  BUN 7 - 25 mg/dL 16  Creatinine 7.59 - 1.63 mg/dL 8.46  Sodium 659 - 935 mmol/L 140  Potassium 3.5 - 5.3 mmol/L 4.2  Chloride 98 - 110 mmol/L 104  CO2 20 - 32 mmol/L 26  Calcium 8.6 - 10.2 mg/dL 9.5  Total Protein 6.1 - 8.1 g/dL 6.7  Total Bilirubin 0.2 - 1.2 mg/dL 0.5  Alkaline Phos 39 - 117 IU/L -  AST 10 - 30 U/L 28  ALT 6 - 29 U/L 48(H)   CBC Latest Ref Rng & Units 05/05/2021  WBC 4.0 - 10.5 K/uL 7.1  Hemoglobin 12.0 - 15.0 g/dL 70.1  Hematocrit 77.9 - 46.0 % 38.6  Platelets 150 - 400 K/uL 214   Iron/TIBC/Ferritin/ %Sat    Component Value Date/Time   IRON 43 05/05/2021 1130   TIBC 365 05/05/2021 1130   FERRITIN 66 05/05/2021 1130   IRONPCTSAT 12 05/05/2021 1130   IRONPCTSAT 20 06/12/2020 0952     Assessment and plan: Patient is a 45 year old female with history of iron deficiency anemia here for routine follow-up  She is not currently anemic.  Hemoglobin is 12.8.  Normocytic.  Iron studies are within normal limits.  However, ferritin has down trended to 66 and iron saturation is now 12%.  TIBC is normal.  She is clinically asymptomatic.  She last received IV iron in June 2021.  She does not require IV iron at this time however, may at the next visit.  Per her request, we will check labs in 3 months and follow-up with virtual visit a few days later.  If she becomes symptomatic we can see her back sooner  History of focal venous thrombosis in 2006 when she was taking oral birth control pills.  She is not currently on birth control and is status post hysterectomy.  She has  not previously required Lovenox prophylaxis for extended travel.  I discussed the assessment and treatment plan with  the patient. The patient was provided an opportunity to ask questions and all were answered. The patient agreed with the plan and demonstrated an understanding of the instructions.   The patient was advised to call back or seek an in-person evaluation if the symptoms worsen or if the condition fails to improve as anticipated.    I spent 15 minutes face-to-face video visit time dedicated to the care of this patient on the date of this encounter to include pre-visit review of labs, heme notes, face-to-face time with the patient, and post visit ordering of testing/documentation.   Visit Diagnosis: 1. Iron deficiency anemia, unspecified iron deficiency anemia type    Consuello Masse, DNP, AGNP-C [facility] [phone] (clinic) 05/06/2021

## 2021-06-07 ENCOUNTER — Other Ambulatory Visit: Payer: Self-pay | Admitting: Unknown Physician Specialty

## 2021-06-08 ENCOUNTER — Encounter: Payer: Self-pay | Admitting: Family Medicine

## 2021-06-08 NOTE — Telephone Encounter (Signed)
Requested Prescriptions  Pending Prescriptions Disp Refills  . TRINTELLIX 20 MG TABS tablet [Pharmacy Med Name: TRINTELLIX 20 MG TABLET] 90 tablet 0    Sig: TAKE 1 TABLET BY MOUTH EVERY DAY     Psychiatry: Antidepressants - Serotonin Modulator Passed - 06/07/2021  8:48 AM      Passed - Completed PHQ-2 or PHQ-9 in the last 360 days      Passed - Valid encounter within last 6 months    Recent Outpatient Visits          2 months ago Essential hypertension   [facility] [place], Sheliah Mends, PA-C   5 months ago Essential hypertension   [facility] [place], Elnita Maxwell, NP   7 months ago Major depressive disorder with current active episode, unspecified depression episode severity, unspecified whether recurrent   [facility] Danelle Berry, PA-C   9 months ago Essential hypertension   [facility] Danelle Berry, PA-C   12 months ago Class 3 severe obesity with body mass index (BMI) of 40.0 to 44.9 in adult, unspecified obesity type, unspecified whether serious comorbidity present [facility])   [facility] Danelle Berry, PA-C      Future Appointments            In 1 month Danelle Berry, PA-C [facility]   In 1 month Creig Hines, MD [facility]

## 2021-07-23 ENCOUNTER — Ambulatory Visit: Payer: Self-pay

## 2021-07-23 NOTE — Telephone Encounter (Signed)
°  Chief Complaint: chest heaviness Symptoms: cough, congestion, chest heaviness Frequency: 1 day Pertinent Negatives: Patient denies fever or chest pain Disposition: [] ED /[] Urgent Care (no appt availability in office) / [x] Appointment(In office/virtual)/ []  [facility]/ [] Home Care/ [] Refused Recommended Disposition /[] [facility]/ []  Follow-up with PCP Additional Notes: Pt states symptoms started last night. Denies having chest pain but just having chest heaviness and discomfort when coughing. Cough is productive with yellowish mucus. Pt is taking Tylenol OTC. Scheduled for appt on 07/24/21 at 1420. Care advice given and pt verbalized understanding. No other questions/concerns noted.     Reason for Disposition  [1] Chest pain(s) lasting a few seconds from coughing AND [2] persists > 3 days  Answer Assessment - Initial Assessment Questions Chest Pain 1. LOCATION: "Where does it hurt?"    Lungs area when coughing 2. RADIATION: "Does the pain go anywhere else?" (e.g., into neck, jaw, arms, back)  No 3. ONSET: "When did the chest pain begin?" (Minutes, hours or days)  Last night 4. PATTERN "Does the pain come and go, or has it been constant since it started?"  "Does it get worse with exertion?" Comes and go when cough 5. SEVERITY: "How bad is the pain?"  (e.g., Scale 1-10; mild, moderate, or severe)     - MILD (1-3): doesn't interfere with normal activities     - MODERATE (4-7): interferes with normal activities or awakens from sleep     - SEVERE (8-10): excruciating pain, unable to do any normal activities   6 6. OTHER SYMPTOMS: "Do you have any other symptoms?" (e.g., dizziness, nausea, vomiting, sweating, fever, difficulty breathing, cough) Back pain, cough  Protocols used: Chest Pain-A-AH

## 2021-07-24 ENCOUNTER — Encounter: Payer: Self-pay | Admitting: Nurse Practitioner

## 2021-07-24 ENCOUNTER — Ambulatory Visit (INDEPENDENT_AMBULATORY_CARE_PROVIDER_SITE_OTHER): Payer: 59 | Admitting: Nurse Practitioner

## 2021-07-24 VITALS — BP 122/70 | HR 91 | Temp 98.4°F | Resp 16 | Ht 64.0 in | Wt 268.5 lb

## 2021-07-24 DIAGNOSIS — R52 Pain, unspecified: Secondary | ICD-10-CM | POA: Diagnosis not present

## 2021-07-24 DIAGNOSIS — R0981 Nasal congestion: Secondary | ICD-10-CM | POA: Diagnosis not present

## 2021-07-24 DIAGNOSIS — R059 Cough, unspecified: Secondary | ICD-10-CM

## 2021-07-24 LAB — POCT INFLUENZA A/B
Influenza A, POC: NEGATIVE
Influenza B, POC: NEGATIVE

## 2021-07-24 MED ORDER — BENZONATATE 100 MG PO CAPS: 200.0000 mg | ORAL_CAPSULE | Freq: Two times a day (BID) | ORAL | 0 refills | Status: DC | PRN

## 2021-07-24 NOTE — Progress Notes (Signed)
BP 122/70    Pulse 91    Temp 98.4 F (36.9 C) (Oral)    Resp 16    Ht 5\' 4"  (1.626 m)    Wt 268 lb 8 oz (121.8 kg)    LMP 05/23/2019 (Approximate)    SpO2 97%    BMI 46.09 kg/m    Subjective:    Patient ID: [id number], female    DOB: 03/24/1976, 46 y.o.   MRN: [id number]  HPI: Joanna Scott is a 46 y.o. female, here alone  Chief Complaint  Patient presents with   Cough   Headache   Nasal Congestion   Generalized Body Aches    All sx started x2 days ago, no fever at the moment. Pt states has been feeling chest discomfort when constant cough. Pt did home test- Negative.   URI: Started Tuesday night cough, nasal congestion, headache and body aches.  She denies any fever. She says she does she feel a little short of breath but not like she cannot breath.  She has been taking Mucinex and it has helped a little. Still coughing and having chest wall pain while coughing.  Discussed OTC treatments to help with symptoms.  Push fluids.  Tested for Flu and Covid.  Flu test came back negative. Discussed treatment for Covid and patient does want it if she is positive.  Discussed side effects of the covid treatment.  She is agreeable to plan.   Relevant past medical, surgical, family and social history reviewed and updated as indicated. Interim medical history since our last visit reviewed. Allergies and medications reviewed and updated.  Review of Systems  Constitutional: Negative for fever or weight change.  Respiratory: Positive for cough and shortness of breath.   Cardiovascular: Negative for chest pain or palpitations. Positive for chest wall pain when coughing Gastrointestinal: Negative for abdominal pain, no bowel changes.  Musculoskeletal: Negative for gait problem or joint swelling.  Skin: Negative for rash.  Neurological: Negative for dizziness, positive for headache.  No other specific complaints in a complete review of systems (except as listed in HPI above).      Objective:     BP 122/70    Pulse 91    Temp 98.4 F (36.9 C) (Oral)    Resp 16    Ht 5\' 4"  (1.626 m)    Wt 268 lb 8 oz (121.8 kg)    LMP 05/23/2019 (Approximate)    SpO2 97%    BMI 46.09 kg/m   Wt Readings from Last 3 Encounters:  07/24/21 268 lb 8 oz (121.8 kg)  03/27/21 273 lb 9.6 oz (124.1 kg)  12/24/20 261 lb 8 oz (118.6 kg)    Physical Exam  Constitutional: Patient appears well-developed and well-nourished. Obese  No distress.  HEENT: head atraumatic, normocephalic, pupils equal and reactive to light,  neck supple Cardiovascular: Normal rate, regular rhythm and normal heart sounds.  No murmur heard. No BLE edema. Pulmonary/Chest: Effort normal and breath sounds normal. No respiratory distress. Abdominal: Soft.  There is no tenderness. Psychiatric: Patient has a normal mood and affect. behavior is normal. Judgment and thought content normal.   Results for orders placed or performed in visit on 07/24/21  POCT Influenza A/B  Result Value Ref Range   Influenza A, POC Negative Negative   Influenza B, POC Negative Negative      Assessment & Plan:   1. Cough, unspecified type -discussed OTC treatments for symptoms -push fluids and get rest - POCT Influenza  A/B - Novel Coronavirus, NAA ([facility]) - benzonatate (TESSALON) 100 MG capsule; Take 2 capsules (200 mg total) by mouth 2 (two) times daily as needed for cough.  Dispense: 20 capsule; Refill: 0  2. Nasal congestion -discussed OTC treatments for symptoms - POCT Influenza A/B - Novel Coronavirus, NAA ([facility])  3. Generalized body aches -discussed OTC treatments for symptoms - POCT Influenza A/B - Novel Coronavirus, NAA ([facility])   Follow up plan: Return if symptoms worsen or fail to improve.

## 2021-07-25 ENCOUNTER — Other Ambulatory Visit: Payer: Self-pay | Admitting: Nurse Practitioner

## 2021-07-25 DIAGNOSIS — U071 COVID-19: Secondary | ICD-10-CM

## 2021-07-25 LAB — NOVEL CORONAVIRUS, NAA: SARS-CoV-2, NAA: DETECTED — AB

## 2021-07-25 LAB — SARS-COV-2, NAA 2 DAY TAT

## 2021-07-25 LAB — SPECIMEN STATUS REPORT

## 2021-07-25 MED ORDER — MOLNUPIRAVIR EUA 200MG CAPSULE: 4.0000 | ORAL_CAPSULE | Freq: Two times a day (BID) | ORAL | 0 refills | Status: AC

## 2021-07-28 ENCOUNTER — Ambulatory Visit: Payer: 59 | Admitting: Physician Assistant

## 2021-07-29 ENCOUNTER — Other Ambulatory Visit: Payer: Self-pay | Admitting: *Deleted

## 2021-07-29 DIAGNOSIS — D509 Iron deficiency anemia, unspecified: Secondary | ICD-10-CM

## 2021-07-31 ENCOUNTER — Encounter: Payer: Self-pay | Admitting: Oncology

## 2021-08-04 ENCOUNTER — Other Ambulatory Visit: Payer: Self-pay

## 2021-08-04 ENCOUNTER — Inpatient Hospital Stay: Payer: 59 | Attending: Oncology

## 2021-08-04 DIAGNOSIS — D509 Iron deficiency anemia, unspecified: Secondary | ICD-10-CM | POA: Insufficient documentation

## 2021-08-04 LAB — FERRITIN: Ferritin: 90 ng/mL (ref 11–307)

## 2021-08-04 LAB — CBC WITH DIFFERENTIAL/PLATELET
Abs Immature Granulocytes: 0.03 10*3/uL (ref 0.00–0.07)
Basophils Absolute: 0.1 10*3/uL (ref 0.0–0.1)
Basophils Relative: 1 %
Eosinophils Absolute: 0.2 10*3/uL (ref 0.0–0.5)
Eosinophils Relative: 2 %
HCT: 40 % (ref 36.0–46.0)
Hemoglobin: 13.3 g/dL (ref 12.0–15.0)
Immature Granulocytes: 0 %
Lymphocytes Relative: 25 %
Lymphs Abs: 2.6 10*3/uL (ref 0.7–4.0)
MCH: 27.3 pg (ref 26.0–34.0)
MCHC: 33.3 g/dL (ref 30.0–36.0)
MCV: 82.1 fL (ref 80.0–100.0)
Monocytes Absolute: 0.6 10*3/uL (ref 0.1–1.0)
Monocytes Relative: 6 %
Neutro Abs: 6.8 10*3/uL (ref 1.7–7.7)
Neutrophils Relative %: 66 %
Platelets: 273 10*3/uL (ref 150–400)
RBC: 4.87 MIL/uL (ref 3.87–5.11)
RDW: 13.8 % (ref 11.5–15.5)
WBC: 10.3 10*3/uL (ref 4.0–10.5)
nRBC: 0 % (ref 0.0–0.2)

## 2021-08-04 LAB — IRON AND TIBC
Iron: 48 ug/dL (ref 28–170)
Saturation Ratios: 13 % (ref 10.4–31.8)
TIBC: 381 ug/dL (ref 250–450)
UIBC: 333 ug/dL

## 2021-08-06 ENCOUNTER — Encounter: Payer: Self-pay | Admitting: Oncology

## 2021-08-06 ENCOUNTER — Inpatient Hospital Stay (HOSPITAL_BASED_OUTPATIENT_CLINIC_OR_DEPARTMENT_OTHER): Payer: 59 | Admitting: Oncology

## 2021-08-06 DIAGNOSIS — D509 Iron deficiency anemia, unspecified: Secondary | ICD-10-CM

## 2021-08-06 NOTE — Progress Notes (Signed)
I connected with Joanna Scott on 08/06/21 at  2:45 PM EST by video enabled telemedicine visit and verified that I am speaking with the correct person using two identifiers.   I discussed the limitations, risks, security and privacy concerns of performing an evaluation and management service by telemedicine and the availability of in-person appointments. I also discussed with the patient that there may be a patient responsible charge related to this service. The patient expressed understanding and agreed to proceed.  Other persons participating in the visit and their role in the encounter:  none  Patient's location:  home Provider's location:  work  Risk analyst Complaint: Routine follow-up of iron deficiency anemia  History of present illness: Patient is a 46 year old female referred to [place] for iron deficiency anemia.  She has a history of Lynch syndrome and has regular follow-up with GI, GYN and dermatology.  Her recent CBC from 11/10/2019 showed white count of 8.8, H&H of 10.9/35.3 with an MCV of 71 and a platelet count of 274.  Iron study showed a low ferritin of 17 and low iron saturation of 6% and elevated TIBC of 461.  Looking back at her prior CBCs patient has had chronic microcytic anemia and over the last 6 months her hemoglobin has been around 10.  TSH in March 2021 was normal. Patient hasd tried oral iron but unable to tolerate due to constipation. She is s/p hysterectomy. Patient sees [facility] and has had EGD and colonoscopy in feb 2021 which did not show any bleeding. Noted to have multiple gastric polyps which were consistent with fundic gland polyps. No evidence of dysplasia   Patient is heterozygous for prothrombin gene mutation. She developed portal venous thrombosis in 2006. She was on birth control at that time. She was on anticoagulation for about 8 months then stopped. She takes prophylactic anticoagulation prior to flight trips. She has not had any thromboembolic episodes since. Prior to  portal venous thrombosis she has gone through C sections without any complications.     Interval history denies any specific complaints at this time and overall she is doing well   Review of Systems  Constitutional:  Negative for chills, fever, malaise/fatigue and weight loss.  HENT:  Negative for congestion, ear discharge and nosebleeds.   Eyes:  Negative for blurred vision.  Respiratory:  Negative for cough, hemoptysis, sputum production, shortness of breath and wheezing.   Cardiovascular:  Negative for chest pain, palpitations, orthopnea and claudication.  Gastrointestinal:  Negative for abdominal pain, blood in stool, constipation, diarrhea, heartburn, melena, nausea and vomiting.  Genitourinary:  Negative for dysuria, flank pain, frequency, hematuria and urgency.  Musculoskeletal:  Negative for back pain, joint pain and myalgias.  Skin:  Negative for rash.  Neurological:  Negative for dizziness, tingling, focal weakness, seizures, weakness and headaches.  Endo/Heme/Allergies:  Does not bruise/bleed easily.  Psychiatric/Behavioral:  Negative for depression and suicidal ideas. The patient does not have insomnia.    Allergies  Allergen Reactions   Peach [Prunus Persica] Anaphylaxis and Nausea And Vomiting   Tree Extract Itching    Sneezing and itchy eyes [name] and Oak    Past Medical History:  Diagnosis Date   Anemia    Anemia 04/10/2019   Anxiety    Bilateral polycystic ovarian syndrome 10/20/2012   Family history of adverse reaction to anesthesia    malignant hyperthermia in 2 first cousins    Family history of malignant hyperthermia    GERD (gastroesophageal reflux disease)    Hypothyroidism  Microcytosis 10/04/2019   Obesity affecting pregnancy    PCOS (polycystic ovarian syndrome)    Portal vein thrombosis    Postpartum care following cesarean delivery 12/19/2014   Preeclampsia in postpartum period    Pregnancy    Previous cesarean delivery, delivered 12/16/2014    Previous cesarean section    Prothrombin gene mutation ([place])    S/P cesarean section 12/17/2014   Scalp psoriasis    Sleep apnea     Past Surgical History:  Procedure Laterality Date   CESAREAN SECTION     CESAREAN SECTION N/A 12/17/2014   Procedure: CESAREAN SECTION;  Surgeon: Will Bonnet, MD;  Location: [facility];  Service: Obstetrics;  Laterality: N/A;   CYSTOSCOPY  06/26/2019   Procedure: CYSTOSCOPY;  Surgeon: Ward, Honor Loh, MD;  Location: [facility];  Service: Gynecology;;   CYSTOSCOPY WITH STENT PLACEMENT Right 06/26/2019   Procedure: CYSTOSCOPY WITH STENT PLACEMENT, Cystotomy;  Surgeon: Ward, Honor Loh, MD;  Location: [facility];  Service: Gynecology;  Laterality: Right;   DIAGNOSTIC LAPAROSCOPY     DILATION AND CURETTAGE OF UTERUS     LAPAROSCOPIC CHOLECYSTECTOMY     Lynch syndrome     ROBOTIC ASSISTED TOTAL HYSTERECTOMY WITH BILATERAL SALPINGO OOPHERECTOMY Bilateral 06/26/2019   Procedure: XI ROBOTIC ASSISTED TOTAL HYSTERECTOMY WITH BILATERAL SALPINGO OOPHORECTOMY;  Surgeon: Ward, Honor Loh, MD;  Location: [facility];  Service: Gynecology;  Laterality: Bilateral;    Social History   Socioeconomic History   Marital status: Married    Spouse name: Gwyndolyn Saxon   Number of children: 2   Years of education: 12   Highest education level: Bachelor's degree (e.g., BA, AB, BS)  Occupational History   Occupation: [employer]  Tobacco Use   Smoking status: Former    Packs/day: 1.00    Years: 0.00    Pack years: 0.00    Types: Cigarettes   Smokeless tobacco: Never   Tobacco comments:    pt stopped at age 5  Vaping Use   Vaping Use: Never used  Substance and Sexual Activity   Alcohol use: Yes    Comment: 2 times a year for special occasions   Drug use: No   Sexual activity: Yes    Birth control/protection: None  Other Topics Concern   Not on file  Social History Narrative   Not on file   Social Determinants of Health   Financial Resource Strain: Not on file  Food  Insecurity: Not on file  Transportation Needs: Not on file  Physical Activity: Not on file  Stress: Not on file  Social Connections: Not on file  Intimate Partner Violence: Not on file    Family History  Problem Relation Age of Onset   Malignant hyperthermia Cousin    Deep vein thrombosis Maternal Grandmother    Hypertension Mother    Depression Mother    COPD Mother    Ovarian cancer Sister    Stroke Maternal Grandfather    Heart attack Maternal Grandfather    Malignant hyperthermia Other    Encopresis Son      Current Outpatient Medications:    aspirin EC 81 MG tablet, Take 81 mg by mouth daily., Disp: , Rfl:    atorvastatin (LIPITOR) 10 MG tablet, Take 1 tablet (10 mg total) by mouth daily., Disp: 90 tablet, Rfl: 3   cetirizine (ZYRTEC) 10 MG tablet, Take 10 mg by mouth daily. , Disp: , Rfl:    EPINEPHrine 0.3 mg/0.3 mL IJ SOAJ injection, Inject 0.3 mg into the  muscle as needed for anaphylaxis., Disp: 1 each, Rfl: 0   ketoconazole (NIZORAL) 2 % shampoo, APPLY 1 APPLICATION TOPICALLY 2 (TWO) TIMES A WEEK., Disp: 120 mL, Rfl: 2   pantoprazole (PROTONIX) 40 MG tablet, TAKE 1 TABLET BY MOUTH EVERY DAY IN THE MORNING, Disp: 90 tablet, Rfl: 1   TRINTELLIX 20 MG TABS tablet, TAKE 1 TABLET BY MOUTH EVERY DAY, Disp: 90 tablet, Rfl: 0   benzonatate (TESSALON) 100 MG capsule, Take 2 capsules (200 mg total) by mouth 2 (two) times daily as needed for cough. (Patient not taking: Reported on 08/06/2021), Disp: 20 capsule, Rfl: 0   metFORMIN (GLUCOPHAGE-XR) 500 MG 24 hr tablet, Take 2 tablets (1,000 mg total) by mouth 2 (two) times daily with a meal. (Patient not taking: Reported on 08/06/2021), Disp: 360 tablet, Rfl: 3  No results found.  No images are attached to the encounter.   CMP Latest Ref Rng & Units 03/27/2021  Glucose 65 - 99 mg/dL 126(H)  BUN 7 - 25 mg/dL 16  Creatinine 0.50 - 0.99 mg/dL 0.81  Sodium 135 - 146 mmol/L 140  Potassium 3.5 - 5.3 mmol/L 4.2  Chloride 98 - 110  mmol/L 104  CO2 20 - 32 mmol/L 26  Calcium 8.6 - 10.2 mg/dL 9.5  Total Protein 6.1 - 8.1 g/dL 6.7  Total Bilirubin 0.2 - 1.2 mg/dL 0.5  Alkaline Phos 39 - 117 IU/L -  AST 10 - 30 U/L 28  ALT 6 - 29 U/L 48(H)   CBC Latest Ref Rng & Units 08/04/2021  WBC 4.0 - 10.5 K/uL 10.3  Hemoglobin 12.0 - 15.0 g/dL 13.3  Hematocrit 36.0 - 46.0 % 40.0  Platelets 150 - 400 K/uL 273     Observation/objective: Appears in no acute distress over video visit today.  Breathing is nonlabored  Assessment and plan: Patient is a 46 year old female with history of iron deficiency anemia and this is a routine follow-up visit  Patient is not currently anemic and her hemoglobin is remained stable between 12.5-13.5 over the last 1-1/2-year.  Her iron studies are presently normal and she does not require any IV iron at this time.  Given that she has not required any IV iron in 18 months she can continue to follow-up with her primary care provider at this time and she can be referred to [place] in the future if questions or concerns arise.  She has a history of Lynch syndrome and will continue to follow-up with GI GYN and dermatology.  Heterozygosity for prothrombin gene mutation: She had portal venous thrombosis back in 2006 and was on anticoagulation for 8 months.  No present thromboembolic episodes.  She does not require any anticoagulation at this time  Follow-up instructions: No follow-up needed  I discussed the assessment and treatment plan with the patient. The patient was provided an opportunity to ask questions and all were answered. The patient agreed with the plan and demonstrated an understanding of the instructions.   The patient was advised to call back or seek an in-person evaluation if the symptoms worsen or if the condition fails to improve as anticipated.    Visit Diagnosis: 1. Iron deficiency anemia, unspecified iron deficiency anemia type     Dr. Randa Evens, MD, MPH [facility] at [facility] Tel- [phone] 08/06/2021 4:09 PM

## 2021-09-06 ENCOUNTER — Other Ambulatory Visit: Payer: Self-pay | Admitting: Family Medicine

## 2021-09-07 ENCOUNTER — Other Ambulatory Visit: Payer: Self-pay | Admitting: Unknown Physician Specialty

## 2021-09-09 NOTE — Telephone Encounter (Signed)
Requested Prescriptions  Pending Prescriptions Disp Refills   pantoprazole (PROTONIX) 40 MG tablet [Pharmacy Med Name: PANTOPRAZOLE SOD DR 40 MG TAB] 90 tablet 1    Sig: TAKE 1 TABLET BY MOUTH EVERY DAY IN THE MORNING     Gastroenterology: Proton Pump Inhibitors Passed - 09/07/2021 11:32 AM      Passed - Valid encounter within last 12 months    Recent Outpatient Visits          1 month ago Cough, unspecified type   [facility] Berniece Salines, FNP   5 months ago Essential hypertension   [facility] Danelle Berry, PA-C   8 months ago Essential hypertension   [facility] [place], Elnita Maxwell, NP   10 months ago Major depressive disorder with current active episode, unspecified depression episode severity, unspecified whether recurrent   [facility] Danelle Berry, PA-C   1 year ago Essential hypertension   [facility] Danelle Berry, [place]

## 2021-10-11 ENCOUNTER — Encounter: Payer: Self-pay | Admitting: Family Medicine

## 2021-10-13 ENCOUNTER — Other Ambulatory Visit: Payer: Self-pay

## 2021-10-13 DIAGNOSIS — Z1231 Encounter for screening mammogram for malignant neoplasm of breast: Secondary | ICD-10-CM

## 2021-11-29 ENCOUNTER — Other Ambulatory Visit: Payer: Self-pay | Admitting: Unknown Physician Specialty

## 2021-12-02 NOTE — Telephone Encounter (Signed)
Requested Prescriptions  Pending Prescriptions Disp Refills  . atorvastatin (LIPITOR) 10 MG tablet [Pharmacy Med Name: ATORVASTATIN 10 MG TABLET] 90 tablet 1    Sig: TAKE 1 TABLET BY MOUTH EVERY DAY     Cardiovascular:  Antilipid - Statins Failed - 11/29/2021 12:32 PM      Failed - Lipid Panel in normal range within the last 12 months    Cholesterol, Total  Date Value Ref Range Status  11/09/2018 215 (H) 100 - 199 mg/dL Final   Cholesterol  Date Value Ref Range Status  03/27/2021 147 <200 mg/dL Final   LDL Cholesterol (Calc)  Date Value Ref Range Status  03/27/2021 87 mg/dL (calc) Final    Comment:    Reference range: <100 . Desirable range <100 mg/dL for primary prevention;   <70 mg/dL for patients with CHD or diabetic patients  with > or = 2 CHD risk factors. Marland Kitchen LDL-C is now calculated using the Martin-Hopkins  calculation, which is a validated novel method providing  better accuracy than the Friedewald equation in the  estimation of LDL-C.  Horald Pollen et al. Lenox Ahr. 0626;948(54): 2061-2068  (http://education.[employer].com/faq/FAQ164)    HDL  Date Value Ref Range Status  03/27/2021 37 (L) > OR = 50 mg/dL Final  62/70/3500 33 (L) >39 mg/dL Final   Triglycerides  Date Value Ref Range Status  03/27/2021 126 <150 mg/dL Final         Passed - Patient is not pregnant      Passed - Valid encounter within last 12 months    Recent Outpatient Visits          4 months ago Cough, unspecified type   [facility] Berniece Salines, FNP   8 months ago Essential hypertension   [facility] Danelle Berry, PA-C   11 months ago Essential hypertension   [facility] [place], Elnita Maxwell, NP   1 year ago Major depressive disorder with current active episode, unspecified depression episode severity, unspecified whether recurrent   [facility] Danelle Berry, PA-C   1 year ago Essential hypertension   [facility] Danelle Berry, [place]

## 2021-12-06 ENCOUNTER — Other Ambulatory Visit: Payer: Self-pay | Admitting: Nurse Practitioner

## 2021-12-09 NOTE — Telephone Encounter (Signed)
Requested Prescriptions  Pending Prescriptions Disp Refills  . TRINTELLIX 20 MG TABS tablet [Pharmacy Med Name: TRINTELLIX 20 MG TABLET] 90 tablet 0    Sig: TAKE 1 TABLET BY MOUTH EVERY DAY     Psychiatry: Antidepressants - Serotonin Modulator Passed - 12/06/2021  9:24 AM      Passed - Completed PHQ-2 or PHQ-9 in the last 360 days      Passed - Valid encounter within last 6 months    Recent Outpatient Visits          4 months ago Cough, unspecified type   [facility] Berniece Salines, FNP   8 months ago Essential hypertension   [facility] Danelle Berry, PA-C   11 months ago Essential hypertension   [facility] [place], Elnita Maxwell, NP   1 year ago Major depressive disorder with current active episode, unspecified depression episode severity, unspecified whether recurrent   [facility] Danelle Berry, PA-C   1 year ago Essential hypertension   [facility] Danelle Berry, [place]

## 2022-01-01 ENCOUNTER — Ambulatory Visit
Admission: RE | Admit: 2022-01-01 | Discharge: 2022-01-01 | Disposition: A | Discharge status: Home / Self Care | Payer: 59 | Source: Ambulatory Visit | Attending: Family Medicine | Admitting: Family Medicine

## 2022-01-01 DIAGNOSIS — Z1231 Encounter for screening mammogram for malignant neoplasm of breast: Secondary | ICD-10-CM | POA: Diagnosis present

## 2022-01-15 IMAGING — CT CT ABD-PELV W/O CM
2 of 4 series · 15 of 46 positions shown, 17 images · non-contrast
Comparison: None.

CLINICAL DATA: Acute non localized left lower quadrant abdominal
pain. Recent surgery.

EXAM:
CT ABDOMEN AND PELVIS WITHOUT CONTRAST
TECHNIQUE: Multidetector CT imaging of the abdomen and pelvis was performed
following the standard protocol without IV contrast.

[Series 2: axial st · axial · 0.80mm/px · z∈[-372,+48]mm · 12 of 94 slices shown, 14 images]
[im 5/94  soft-tissue]
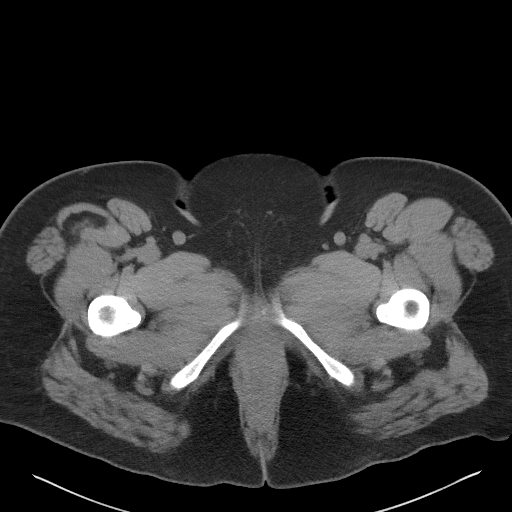
[im 5/94  bone]
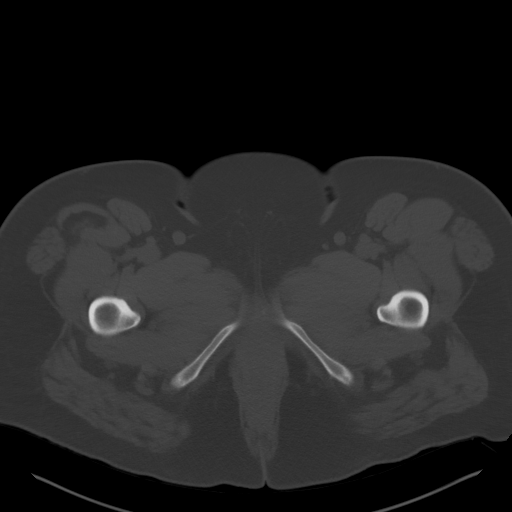
[im 13/94  soft-tissue]
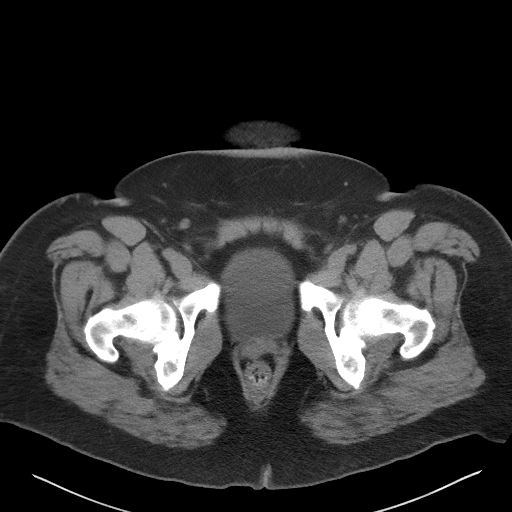
[im 21/94  soft-tissue]
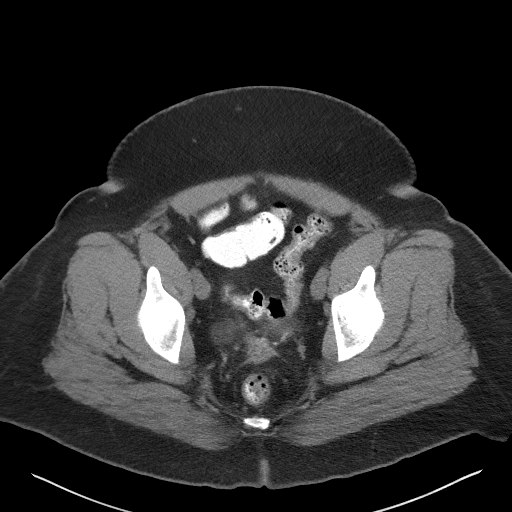
[im 29/94  soft-tissue]
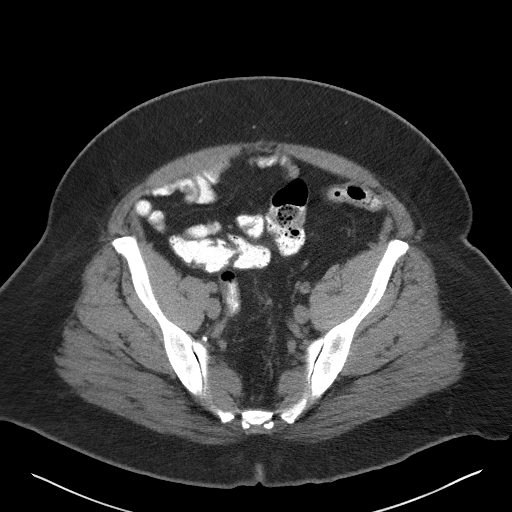
[im 37/94  soft-tissue]
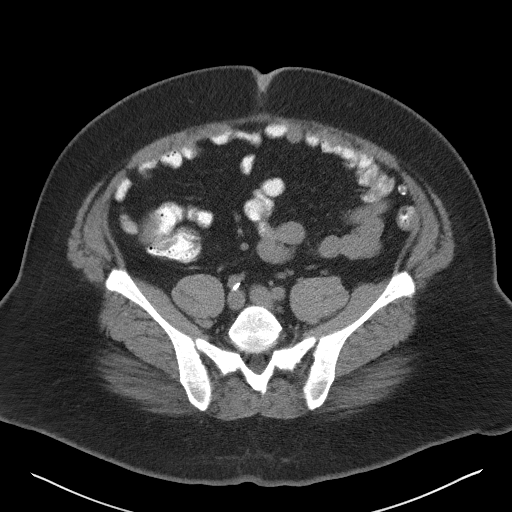
[im 45/94  soft-tissue]
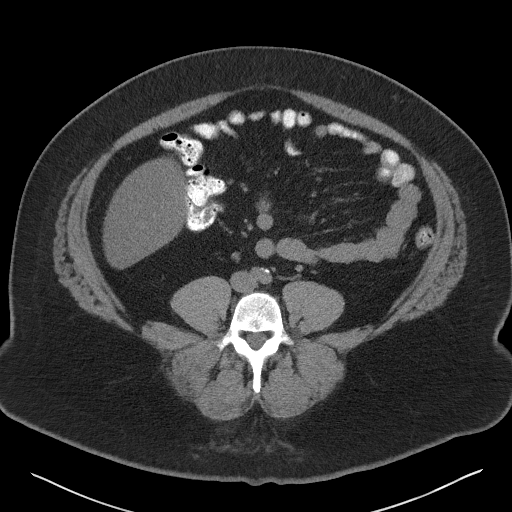
[im 49/94  soft-tissue]
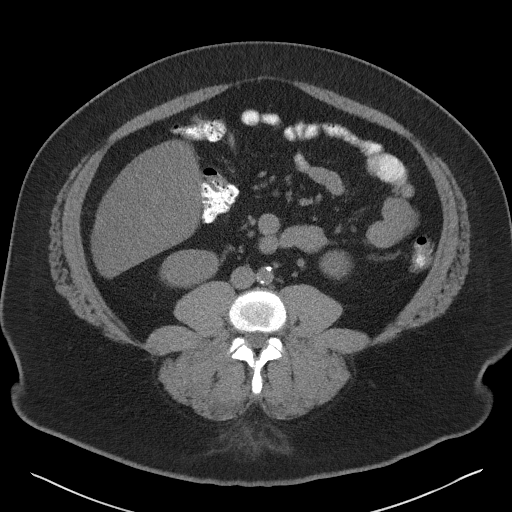
[im 57/94  soft-tissue]
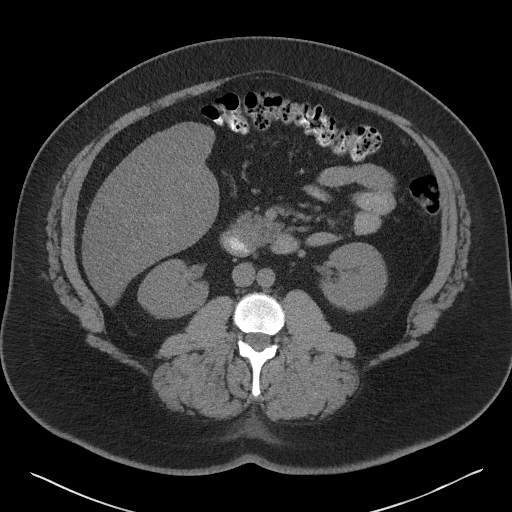
[im 65/94  soft-tissue]
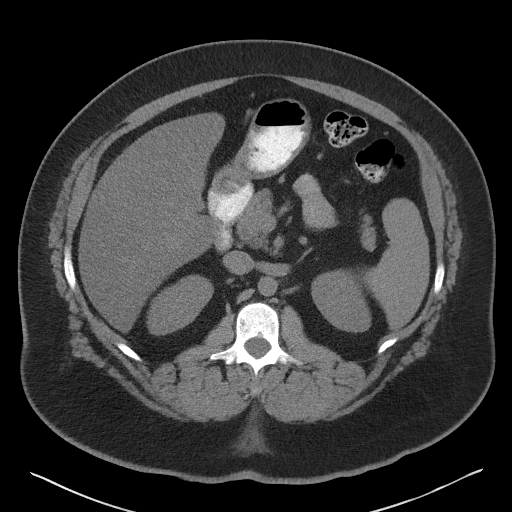
[im 65/94  bone]
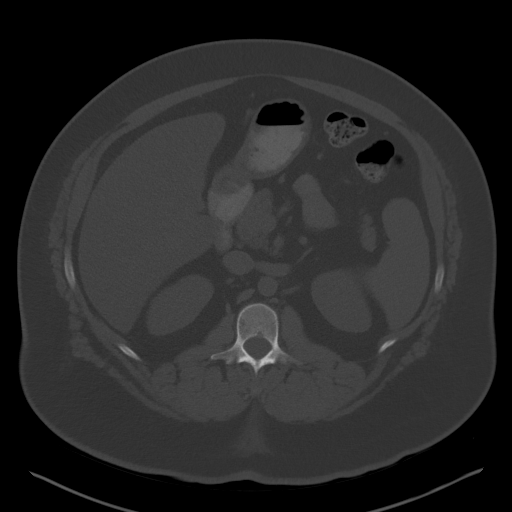
[im 73/94  soft-tissue]
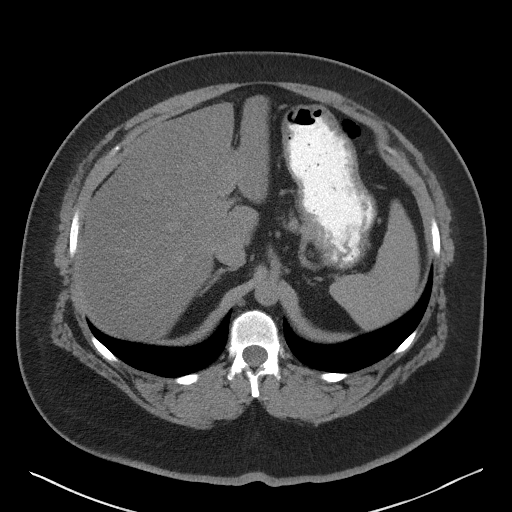
[im 81/94  soft-tissue]
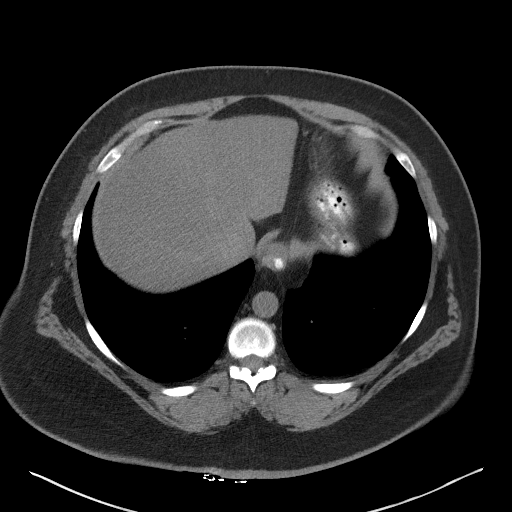
[im 89/94  soft-tissue]
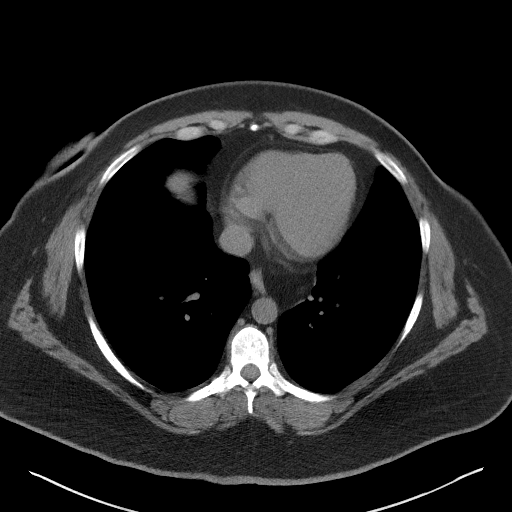

[Series 5: coronal st · coronal · 0.88mm/px · 3 of 116 slices shown]
[im 39/116  soft-tissue]
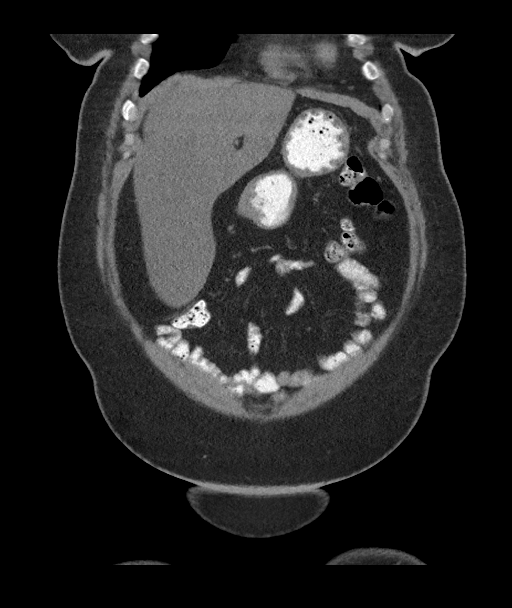
[im 52/116  soft-tissue]
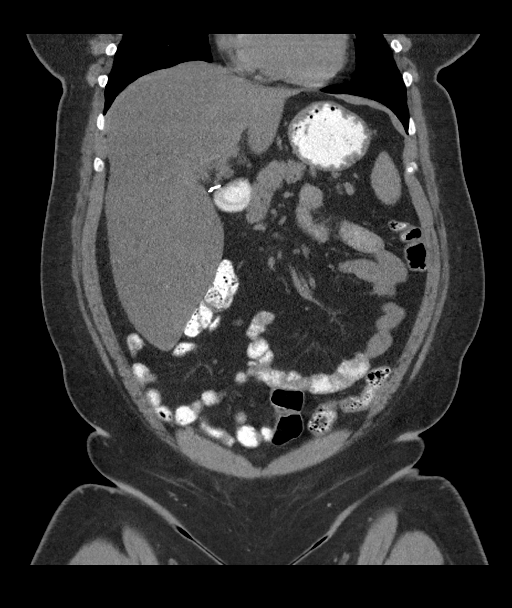
[im 64/116  soft-tissue]
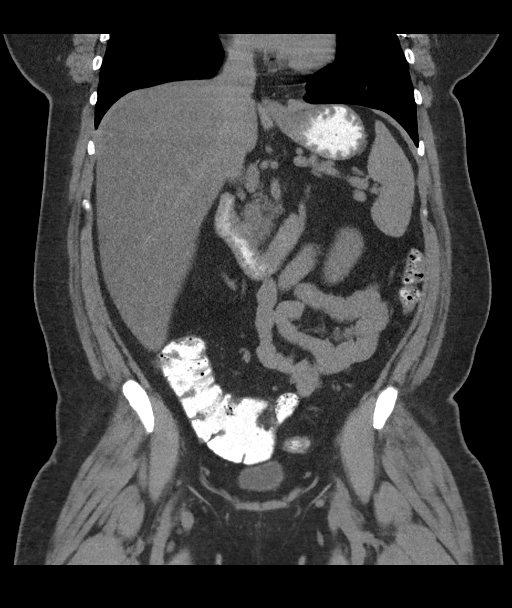

[15 of 46 positions shown; findings below may reference images not displayed]

FINDINGS: The lack of intravenous contrast limits the ability to evaluate
solid abdominal organs.

Lower chest: Limited visualization the lower thorax is negative for
focal airspace opacity or pleural effusion. Normal heart size
however note is made focal dilatation at the level of the left
ventricular apex (image 5, series 2), nonspecific though could be
seen in the setting of wall thinning as a sequela of prior
infarction. No pericardial effusion.

Hepatobiliary: Hepatomegaly. There is diffuse decreased attenuation
hepatic parenchyma compatible with hepatic steatosis. Mild
nodularity hepatic contour. Post cholecystectomy. No ascites.

Pancreas: Normal noncontrast appearance of the pancreas.

Spleen: Normal noncontrast appearance of the spleen.

Adrenals/Urinary Tract: Punctate (2 mm) nonobstructing stones are
seen within the inferior pole of the right kidney (image 71, series
5) and superior pole of the left kidney (coronal image 76, series
5). No discrete renal stones are seen along the expected course of
either ureter or the urinary bladder. Normal noncontrast appearance
the urinary bladder given degree of distention. No urinary
obstruction or perinephric stranding.

Normal noncontrast appearance of the bilateral adrenal glands.

Stomach/Bowel: Ingested enteric contrast extends to the level of the
rectum. No evidence of enteric obstruction. The cecum is noted to be
located within the midline of the lower abdomen/pelvis. Normal
appearance of the terminal ileum. The appendix is not visualized
however there is no pericecal inflammatory change. Small hiatal
hernia containing predominantly mesenteric fat. No discrete areas of
bowel wall thickening on this noncontrast examination.

Vascular/Lymphatic: Atherosclerotic plaque within normal caliber
abdominal aorta.

Scattered retroperitoneal and pelvic lymph nodes are numerous though
individually not enlarged by size criteria with index left common
iliac chain lymph node measuring 0.5 cm in greatest short axis
diameter (image 72, series 2) and right external iliac chain lymph
node measuring 0.6 cm (image 78, series 2), presumably reactive due
to patient body habitus. No definitive bulky retroperitoneal,
mesenteric, pelvic or inguinal lymphadenopathy.

Reproductive: Post hysterectomy. No discrete adnexal lesions. No
free fluid the pelvic cul-de-sac.

Other: Tiny mesenteric fat containing periumbilical hernia. There is
a minimal amount of subcutaneous edema about the midline low back.

Musculoskeletal: No acute or aggressive osseous abnormalities. Mild
degenerative change the bilateral hips with joint space loss,
subchondral sclerosis and osteophytosis.
IMPRESSION: 1. No explanation for patient's acute left lower quadrant abdominal
pain. Specifically, no evidence of enteric or urinary obstruction.
2. Solitary punctate (2 mm) nonobstructing bilateral renal stones.
3. Hepatomegaly with findings of hepatic steatosis and mild
nodularity hepatic contour as could be seen in the setting of early
cirrhotic change. Correlation with LFTs is advised.
4. Small hiatal hernia containing predominantly mesenteric fat.
5. Focal outpouching involving the left ventricular apex,
nonspecific though could be seen in the setting of wall thinning as
a sequela of prior infarction. Clinical correlation is advised.
Further evaluation with nonemergent cardiac echo could be performed
as indicated.
6. Aortic Atherosclerosis ([id number]).

## 2022-03-13 ENCOUNTER — Other Ambulatory Visit: Payer: Self-pay | Admitting: Family Medicine

## 2022-04-06 ENCOUNTER — Telehealth: Payer: Self-pay | Admitting: Family Medicine

## 2022-04-07 NOTE — Telephone Encounter (Signed)
Requested medication (s) are due for refill today: yes  Requested medication (s) are on the active medication list: yes  Last refill:  12/09/21  Future visit scheduled: no  Notes to clinic:  called pt and LM on VM to call back to make appt-    Requested Prescriptions  Pending Prescriptions Disp Refills   TRINTELLIX 20 MG TABS tablet [Pharmacy Med Name: TRINTELLIX 20 MG TABLET] 90 tablet 0    Sig: TAKE 1 [place]     Psychiatry: Antidepressants - Serotonin Modulator Failed - 04/06/2022 12:37 PM      Failed - Valid encounter within last 6 months    Recent Outpatient Visits           8 months ago Cough, unspecified type   [facility] Bo Merino, FNP   1 year ago Essential hypertension   [facility] Delsa Grana, PA-C   1 year ago Essential hypertension   [place], NP   1 year ago Major depressive disorder with current active episode, unspecified depression episode severity, unspecified whether recurrent   [facility] Delsa Grana, PA-C   1 year ago Essential hypertension   [place], PA-C              Passed - Completed PHQ-2 or PHQ-9 in the last 360 days

## 2022-04-08 NOTE — Telephone Encounter (Signed)
Pt notified about refill and has scheduled an appt

## 2022-04-24 ENCOUNTER — Ambulatory Visit: Payer: 59 | Admitting: Family Medicine

## 2022-04-27 ENCOUNTER — Ambulatory Visit (INDEPENDENT_AMBULATORY_CARE_PROVIDER_SITE_OTHER): Payer: 59 | Admitting: Family Medicine

## 2022-04-27 ENCOUNTER — Encounter: Payer: Self-pay | Admitting: Family Medicine

## 2022-04-27 VITALS — BP 132/82 | HR 100 | Temp 98.4°F | Resp 16 | Ht 64.0 in | Wt 280.7 lb

## 2022-04-27 DIAGNOSIS — E8881 Metabolic syndrome: Secondary | ICD-10-CM

## 2022-04-27 DIAGNOSIS — Z6841 Body Mass Index (BMI) 40.0 and over, adult: Secondary | ICD-10-CM

## 2022-04-27 DIAGNOSIS — E785 Hyperlipidemia, unspecified: Secondary | ICD-10-CM | POA: Diagnosis not present

## 2022-04-27 DIAGNOSIS — F329 Major depressive disorder, single episode, unspecified: Secondary | ICD-10-CM | POA: Diagnosis not present

## 2022-04-27 DIAGNOSIS — Z1509 Genetic susceptibility to other malignant neoplasm: Secondary | ICD-10-CM

## 2022-04-27 DIAGNOSIS — E039 Hypothyroidism, unspecified: Secondary | ICD-10-CM | POA: Diagnosis not present

## 2022-04-27 DIAGNOSIS — I7090 Unspecified atherosclerosis: Secondary | ICD-10-CM

## 2022-04-27 DIAGNOSIS — Z23 Encounter for immunization: Secondary | ICD-10-CM

## 2022-04-27 DIAGNOSIS — E119 Type 2 diabetes mellitus without complications: Secondary | ICD-10-CM

## 2022-04-27 DIAGNOSIS — Z1211 Encounter for screening for malignant neoplasm of colon: Secondary | ICD-10-CM

## 2022-04-27 DIAGNOSIS — D6852 Prothrombin gene mutation: Secondary | ICD-10-CM

## 2022-04-27 DIAGNOSIS — E282 Polycystic ovarian syndrome: Secondary | ICD-10-CM

## 2022-04-27 DIAGNOSIS — E66813 Obesity, class 3: Secondary | ICD-10-CM

## 2022-04-27 DIAGNOSIS — Z5181 Encounter for therapeutic drug level monitoring: Secondary | ICD-10-CM

## 2022-04-27 DIAGNOSIS — Z636 Dependent relative needing care at home: Secondary | ICD-10-CM

## 2022-04-27 MED ORDER — CETIRIZINE HCL 10 MG PO TABS: 10.0000 mg | ORAL_TABLET | Freq: Every day | ORAL | Status: AC | PRN

## 2022-04-27 NOTE — Progress Notes (Unsigned)
Name: Joanna Scott   MRN: [id number]    DOB: 1976/05/02   Date:04/27/2022       Progress Note  Chief Complaint  Patient presents with   Follow-up   Hypothyroidism   Hyperlipidemia   Diabetes    Pt looking forward to see an endocrinologist   Depression     Subjective:   Joanna Scott is a 46 y.o. female, presents to clinic for routine f/up and labs  She has been very busy helping care for her sister with stage 4 CRC, lives out of the area shes had to travel out of state a lot and its been distressing for her and her children  DM/prediabetes She has gastroparesis exacerbated by GLP-1s so those were stopped previously, and she did not tolerate metformin, hx of DM/prediabetes metabolic syndrome PCOS  Her A1C had been controlled without meds previously  Recent pertinent labs: Lab Results  Component Value Date   HGBA1C 6.8 (H) 03/27/2021   HGBA1C 5.9 (H) 12/24/2020   HGBA1C 5.3 06/12/2020   HGBA1C 5.8 (H) 10/02/2019   HGBA1C 6.2 (H) 11/09/2018  Foot exam done today- due Due for dm eye exam On statin, not on acei/arb Last year was first A1C in T2DM range, she has not been back since then  Obesity: She has done nutritional counseling, followed various healthy and low calorie diets and she exercises and she continues to gain weight. Lowest weight in the past couple years was 230's, now highest weight 280 Wt Readings from Last 5 Encounters:  04/27/22 280 lb 11.2 oz (127.3 kg)  07/24/21 268 lb 8 oz (121.8 kg)  03/27/21 273 lb 9.6 oz (124.1 kg)  12/24/20 261 lb 8 oz (118.6 kg)  11/04/20 255 lb 3.2 oz (115.8 kg)   BMI Readings from Last 5 Encounters:  04/27/22 48.18 kg/m  07/24/21 46.09 kg/m  03/27/21 46.96 kg/m  12/24/20 44.89 kg/m  11/04/20 43.80 kg/m  She would like to consult with endo with her hx Weight lowest in the past several years in 2021 - she was on levothyroxine at that time, tried saxenda, metformin, one Rx for phentermine, she was recovering from her  major surgery and anemia and exercising and dieting a lot at that time.  She has been on trintellix for years   Prior hypothyroid diagnosed but she is not on meds, previously on 50-100 mcg but stopped at least 2 yrs ago Lab Results  Component Value Date   TSH 2.38 04/28/2022   TSH 2.19 03/27/2021   TSH 1.88 06/12/2020   TSH 0.552 03/05/2020   TSH 0.42 10/02/2019   Lynch syndrome, S/P total hysterectomy and bilateral salpingo-oophorectomy in 2020 - seeing Lynch specialist and doing colon cancer screening with [facility], her next flex sig is Nov 2023 Delayed gastric emptying/gastroparesis uses reglan as needed per gi  MDD - stable on trintellix     04/27/2022    2:06 PM 07/24/2021    2:21 PM 03/27/2021    8:34 AM  Depression screen PHQ 2/9  Decreased Interest 1 0 1  Down, Depressed, Hopeless 1 0 1  PHQ - 2 Score 2 0 2  Altered sleeping 0 0 0  Tired, decreased energy 0 0 1  Change in appetite 0 0 1  Feeling bad or failure about yourself  0 0 1  Trouble concentrating 0 0 1  Moving slowly or fidgety/restless 0 0 0  Suicidal thoughts 0 0 0  PHQ-9 Score 2 0 6  Difficult doing work/chores  Somewhat difficult Not difficult at all Somewhat difficult    HLD on lipitor 10       Current Outpatient Medications:    aspirin EC 81 MG tablet, Take 81 mg by mouth daily., Disp: , Rfl:    atorvastatin (LIPITOR) 10 MG tablet, TAKE 1 TABLET BY MOUTH EVERY DAY, Disp: 90 tablet, Rfl: 1   cetirizine (ZYRTEC) 10 MG tablet, Take 10 mg by mouth daily. , Disp: , Rfl:    EPINEPHrine 0.3 mg/0.3 mL IJ SOAJ injection, Inject 0.3 mg into the muscle as needed for anaphylaxis., Disp: 1 each, Rfl: 0   ketoconazole (NIZORAL) 2 % shampoo, APPLY 1 APPLICATION TOPICALLY 2 (TWO) TIMES A WEEK., Disp: 120 mL, Rfl: 2   pantoprazole (PROTONIX) 40 MG tablet, TAKE 1 TABLET BY MOUTH EVERY DAY IN THE MORNING, Disp: 90 tablet, Rfl: 1   TRINTELLIX 20 MG TABS tablet, TAKE 1 TABLET BY MOUTH EVERY DAY, Disp: 90 tablet, Rfl: 0    benzonatate (TESSALON) 100 MG capsule, Take 2 capsules (200 mg total) by mouth 2 (two) times daily as needed for cough. (Patient not taking: Reported on 08/06/2021), Disp: 20 capsule, Rfl: 0   metFORMIN (GLUCOPHAGE-XR) 500 MG 24 hr tablet, Take 2 tablets (1,000 mg total) by mouth 2 (two) times daily with a meal. (Patient not taking: Reported on 08/06/2021), Disp: 360 tablet, Rfl: 3  Patient Active Problem List   Diagnosis Date Noted   Fatty liver disease, nonalcoholic 21/30/8657   Atherosclerosis 12/24/2020   OSA on CPAP 09/23/2020   New onset type 2 diabetes mellitus ([place]) 09/23/2020   Lynch syndrome 06/12/2020   Allergy with anaphylaxis due to food 84/69/6295   Metabolic syndrome 28/41/3244   Iron deficiency anemia 12/14/2019   Hypothyroidism 10/04/2019   Seborrheic dermatitis of scalp 10/04/2019   Class 3 severe obesity with body mass index (BMI) of 40.0 to 44.9 in adult ([place]) 10/04/2019   Prothrombin gene mutation ([place]) 10/04/2019   S/P total hysterectomy and bilateral salpingo-oophorectomy 06/26/2019   Hyperlipidemia 04/10/2019   Prediabetes 04/10/2019   Major depressive disorder with current active episode 04/10/2019   Allergic rhinitis 04/10/2019   PCOS (polycystic ovarian syndrome) 10/20/2012    Past Surgical History:  Procedure Laterality Date   CESAREAN SECTION     CESAREAN SECTION N/A 12/17/2014   Procedure: CESAREAN SECTION;  Surgeon: Will Bonnet, MD;  Location: [facility] ORS;  Service: Obstetrics;  Laterality: N/A;   CYSTOSCOPY  06/26/2019   Procedure: CYSTOSCOPY;  Surgeon: Ward, Honor Loh, MD;  Location: [facility] ORS;  Service: Gynecology;;   CYSTOSCOPY WITH STENT PLACEMENT Right 06/26/2019   Procedure: CYSTOSCOPY WITH STENT PLACEMENT, Cystotomy;  Surgeon: Ward, Honor Loh, MD;  Location: [facility] ORS;  Service: Gynecology;  Laterality: Right;   DIAGNOSTIC LAPAROSCOPY     DILATION AND CURETTAGE OF UTERUS     LAPAROSCOPIC CHOLECYSTECTOMY     Lynch syndrome     ROBOTIC ASSISTED  TOTAL HYSTERECTOMY WITH BILATERAL SALPINGO OOPHERECTOMY Bilateral 06/26/2019   Procedure: XI ROBOTIC ASSISTED TOTAL HYSTERECTOMY WITH BILATERAL SALPINGO OOPHORECTOMY;  Surgeon: Ward, Honor Loh, MD;  Location: [facility] ORS;  Service: Gynecology;  Laterality: Bilateral;    Family History  Problem Relation Age of Onset   Hypertension Mother    Depression Mother    COPD Mother    Ovarian cancer Sister    Deep vein thrombosis Maternal Grandmother    Stroke Maternal Grandfather    Heart attack Maternal Grandfather    Malignant hyperthermia Cousin    Encopresis  Son    Malignant hyperthermia Other    Breast cancer Neg Hx     Social History   Tobacco Use   Smoking status: Former    Packs/day: 1.00    Years: 0.00    Total pack years: 0.00    Types: Cigarettes   Smokeless tobacco: Never   Tobacco comments:    pt stopped at age 75  Vaping Use   Vaping Use: Never used  Substance Use Topics   Alcohol use: Yes    Comment: 2 times a year for special occasions   Drug use: No     Allergies  Allergen Reactions   Peach [Prunus Persica] Anaphylaxis and Nausea And Vomiting   Tree Extract Itching    Sneezing and itchy eyes [facility] Maintenance  Topic Date Due   FOOT EXAM  Never done   OPHTHALMOLOGY EXAM  Never done   Diabetic kidney evaluation - Urine ACR  Never done   HEMOGLOBIN A1C  09/24/2021   Diabetic kidney evaluation - GFR measurement  03/27/2022   COVID-19 Vaccine (3 - Pfizer risk series) 05/13/2022 (Originally 11/08/2019)   INFLUENZA VACCINE  10/11/2022 (Originally 02/10/2022)   COLONOSCOPY (Pts 45-28yrs Insurance coverage will need to be confirmed)  04/28/2023 (Originally 06/01/2021)   TETANUS/TDAP  07/13/2024   Hepatitis C Screening  Completed   HIV Screening  Completed   HPV VACCINES  Aged Out   PAP SMEAR-Modifier  Discontinued    Chart Review Today: I personally reviewed active problem list, medication list, allergies, family history, social history,  health maintenance, notes from last encounter, lab results, imaging with the patient/caregiver today.   Review of Systems  Constitutional: Negative.   HENT: Negative.    Eyes: Negative.   Respiratory: Negative.    Cardiovascular: Negative.   Gastrointestinal: Negative.   Endocrine: Negative.   Genitourinary: Negative.   Musculoskeletal: Negative.   Skin: Negative.   Allergic/Immunologic: Negative.   Neurological: Negative.   Hematological: Negative.   Psychiatric/Behavioral: Negative.    All other systems reviewed and are negative.    Objective:   Vitals:   04/27/22 1403  BP: 132/82  Pulse: 100  Resp: 16  Temp: 98.4 F (36.9 C)  TempSrc: Oral  SpO2: 98%  Weight: 280 lb 11.2 oz (127.3 kg)  Height: 5\' 4"  (1.626 m)    Body mass index is 48.18 kg/m.  Physical Exam Vitals and nursing note reviewed.  Constitutional:      General: She is not in acute distress.    Appearance: Normal appearance. She is well-developed and well-groomed. She is morbidly obese. She is not ill-appearing, toxic-appearing or diaphoretic.     Comments: Central obesity  HENT:     Head: Normocephalic and atraumatic.     Right Ear: External ear normal.     Left Ear: External ear normal.     Nose: Nose normal.  Eyes:     General: No scleral icterus.       Right eye: No discharge.        Left eye: No discharge.     Conjunctiva/sclera: Conjunctivae normal.  Neck:     Trachea: No tracheal deviation.  Cardiovascular:     Rate and Rhythm: Normal rate and regular rhythm.  Pulmonary:     Effort: Pulmonary effort is normal. No respiratory distress.     Breath sounds: No stridor.  Abdominal:     Comments: Abd obesity  Musculoskeletal:  General: Normal range of motion.  Skin:    General: Skin is warm and dry.     Findings: No rash.  Neurological:     Mental Status: She is alert.     Motor: No abnormal muscle tone.     Coordination: Coordination normal.  Psychiatric:        Behavior:  Behavior normal. Behavior is cooperative.     Diabetic Foot Exam - Simple   Simple Foot Form Diabetic Foot exam was performed with the following findings: Yes 04/27/2022  3:00 PM  Visual Inspection No deformities, no ulcerations, no other skin breakdown bilaterally: Yes Sensation Testing Intact to touch and monofilament testing bilaterally: Yes Pulse Check Posterior Tibialis and Dorsalis pulse intact bilaterally: Yes Comments        Assessment & Plan:   Problem List Items Addressed This Visit       Cardiovascular and Mediastinum   Atherosclerosis    On statin, due for labs        Endocrine   PCOS (polycystic ovarian syndrome)    S/p total hysterectomy - still concerned about the metabolic aspects of PCOS and still gaining weight      Relevant Orders   Insulin, Free (Bioactive)   Hypothyroidism    Not currently on meds, was previously (last in 2021 - 100 mcg) TSH levels have been in normal range, other thyroid labs have not been done May benefit from restarting - she is interested in seeing endo, with her other metabolic issues may defer to specialists      Relevant Orders   TSH (Completed)   COMPLETE METABOLIC PANEL WITH GFR (Completed)   New onset type 2 diabetes mellitus (HCC)    New onset a year ago Previously was on saxenda and metformin for metabolic syndrome and prediabetes, she cannot tolerate either Recheck labs Foot exam done today Due for everything ordered below She has gained weight despite diet and lifestyle efforts On statin, not on ACEI/ARB        Other   Hyperlipidemia - Primary    On statin, due for labs      Relevant Orders   COMPLETE METABOLIC PANEL WITH GFR (Completed)   Lipid panel (Completed)   Major depressive disorder with current active episode    Stable on trintellix 20 She has significant family/lifes stress with sister with advanced cancer, caring for her, traveling in and out of state which has been distressing to her  children - they are starting to talk about death and have panic attacks when she leaves to care for her sister.  She is looking for therapists locally for everyone in the family. If/when her sister passes away she will be guardian of her 104 y/o nephew (they are in [place])     04/27/2022    2:06 PM 07/24/2021    2:21 PM 03/27/2021    8:34 AM  Depression screen PHQ 2/9  Decreased Interest 1 0 1  Down, Depressed, Hopeless 1 0 1  PHQ - 2 Score 2 0 2  Altered sleeping 0 0 0  Tired, decreased energy 0 0 1  Change in appetite 0 0 1  Feeling bad or failure about yourself  0 0 1  Trouble concentrating 0 0 1  Moving slowly or fidgety/restless 0 0 0  Suicidal thoughts 0 0 0  PHQ-9 Score 2 0 6  Difficult doing work/chores Somewhat difficult Not difficult at all Somewhat difficult        Class 3 severe  obesity with serious comorbidity and body mass index (BMI) of 45.0 to 49.9 in adult [facility](HCC)    She had previously demonstrated healthy diet and lifestyle efforts, she has tried calorie deficit, weight loss meds, healthy weight nutritional programs, not a candidate for weight loss surgery, she continues to gain weight despite her efforts  (could try B12, wellbutrin? Previously tried and failed phentermine, saxenda)      Prothrombin gene mutation [facility](HCC)    She sees hematology - anticoagulated around procedures and flying      Metabolic syndrome    Dylipidemia, morbid obesity, insulin resistance, new onset T2DM, increasing weight despite significant efforts, multiple meds tried      Relevant Orders   Insulin, Free (Bioactive)   Other Visit Diagnoses     Need for influenza vaccination       declined   Encounter for medication monitoring       Relevant Orders   Hemoglobin A1c (Completed)   TSH (Completed)   COMPLETE METABOLIC PANEL WITH GFR (Completed)   Lipid panel (Completed)   CBC with Differential/Platelet (Completed)   Caregiver stress       increasing with sisters dx and  health, she is seaking therapists locally for her and family        Danelle BerryLeisa Keimani Laufer, PA-C 04/27/22 2:28 PM

## 2022-04-29 ENCOUNTER — Encounter: Payer: Self-pay | Admitting: Family Medicine

## 2022-04-29 NOTE — Assessment & Plan Note (Signed)
On statin, due for labs.

## 2022-04-29 NOTE — Assessment & Plan Note (Signed)
S/p total hysterectomy - still concerned about the metabolic aspects of PCOS and still gaining weight

## 2022-04-29 NOTE — Assessment & Plan Note (Signed)
She sees hematology - anticoagulated around procedures and flying

## 2022-04-29 NOTE — Assessment & Plan Note (Signed)
Dylipidemia, morbid obesity, insulin resistance, new onset T2DM, increasing weight despite significant efforts, multiple meds tried

## 2022-04-29 NOTE — Assessment & Plan Note (Addendum)
She had previously demonstrated healthy diet and lifestyle efforts, she has tried calorie deficit, weight loss meds, healthy weight nutritional programs, not a candidate for weight loss surgery, she continues to gain weight despite her efforts  (could try B12, wellbutrin? Previously tried and failed phentermine, saxenda)

## 2022-04-29 NOTE — Assessment & Plan Note (Signed)
New onset a year ago Previously was on saxenda and metformin for metabolic syndrome and prediabetes, she cannot tolerate either Recheck labs Foot exam done today Due for everything ordered below She has gained weight despite diet and lifestyle efforts On statin, not on ACEI/ARB

## 2022-04-29 NOTE — Assessment & Plan Note (Signed)
Not currently on meds, was previously (last in 2021 - 100 mcg) TSH levels have been in normal range, other thyroid labs have not been done May benefit from restarting - she is interested in seeing endo, with her other metabolic issues may defer to specialists

## 2022-04-29 NOTE — Assessment & Plan Note (Signed)
Stable on trintellix 20 She has significant family/lifes stress with sister with advanced cancer, caring for her, traveling in and out of state which has been distressing to her children - they are starting to talk about death and have panic attacks when she leaves to care for her sister.  She is looking for therapists locally for everyone in the family. If/when her sister passes away she will be guardian of her 46 y/o nephew (they are in [place])     04/27/2022    2:06 PM 07/24/2021    2:21 PM 03/27/2021    8:34 AM  Depression screen PHQ 2/9  Decreased Interest 1 0 1  Down, Depressed, Hopeless 1 0 1  PHQ - 2 Score 2 0 2  Altered sleeping 0 0 0  Tired, decreased energy 0 0 1  Change in appetite 0 0 1  Feeling bad or failure about yourself  0 0 1  Trouble concentrating 0 0 1  Moving slowly or fidgety/restless 0 0 0  Suicidal thoughts 0 0 0  PHQ-9 Score 2 0 6  Difficult doing work/chores Somewhat difficult Not difficult at all Somewhat difficult

## 2022-05-05 ENCOUNTER — Encounter: Payer: Self-pay | Admitting: Family Medicine

## 2022-05-06 LAB — HEMOGLOBIN A1C
Hgb A1c MFr Bld: 7.4 % of total Hgb — ABNORMAL HIGH (ref <5.7)
Mean Plasma Glucose: 166 mg/dL
eAG (mmol/L): 9.2 mmol/L

## 2022-05-06 LAB — CBC WITH DIFFERENTIAL/PLATELET
Absolute Monocytes: 436 cells/uL (ref 200–950)
Basophils Absolute: 60 cells/uL (ref 0–200)
Basophils Relative: 0.9 %
Eosinophils Absolute: 168 cells/uL (ref 15–500)
Eosinophils Relative: 2.5 %
HCT: 38.8 % (ref 35.0–45.0)
Hemoglobin: 12.9 g/dL (ref 11.7–15.5)
Lymphs Abs: 1863 cells/uL (ref 850–3900)
MCH: 27.5 pg (ref 27.0–33.0)
MCHC: 33.2 g/dL (ref 32.0–36.0)
MCV: 82.7 fL (ref 80.0–100.0)
MPV: 11.3 fL (ref 7.5–12.5)
Monocytes Relative: 6.5 %
Neutro Abs: 4174 cells/uL (ref 1500–7800)
Neutrophils Relative %: 62.3 %
Platelets: 244 10*3/uL (ref 140–400)
RBC: 4.69 10*6/uL (ref 3.80–5.10)
RDW: 13.5 % (ref 11.0–15.0)
Total Lymphocyte: 27.8 %
WBC: 6.7 10*3/uL (ref 3.8–10.8)

## 2022-05-06 LAB — COMPLETE METABOLIC PANEL WITH GFR
AG Ratio: 1.5 (calc) (ref 1.0–2.5)
ALT: 44 U/L — ABNORMAL HIGH (ref 6–29)
AST: 30 U/L (ref 10–35)
Albumin: 4.3 g/dL (ref 3.6–5.1)
Alkaline phosphatase (APISO): 104 U/L (ref 31–125)
BUN: 14 mg/dL (ref 7–25)
CO2: 28 mmol/L (ref 20–32)
Calcium: 9.5 mg/dL (ref 8.6–10.2)
Chloride: 105 mmol/L (ref 98–110)
Creat: 0.94 mg/dL (ref 0.50–0.99)
Globulin: 2.8 g/dL (calc) (ref 1.9–3.7)
Glucose, Bld: 141 mg/dL — ABNORMAL HIGH (ref 65–99)
Potassium: 4.3 mmol/L (ref 3.5–5.3)
Sodium: 140 mmol/L (ref 135–146)
Total Bilirubin: 0.4 mg/dL (ref 0.2–1.2)
Total Protein: 7.1 g/dL (ref 6.1–8.1)
eGFR: 76 mL/min/{1.73_m2} (ref >=60)

## 2022-05-06 LAB — TSH: TSH: 2.38 mIU/L

## 2022-05-06 LAB — LIPID PANEL
Cholesterol: 175 mg/dL (ref <200)
HDL: 37 mg/dL — ABNORMAL LOW (ref >=50)
LDL Cholesterol (Calc): 112 mg/dL (calc) — ABNORMAL HIGH
Non-HDL Cholesterol (Calc): 138 mg/dL (calc) — ABNORMAL HIGH (ref <130)
Total CHOL/HDL Ratio: 4.7 (calc) (ref <5.0)
Triglycerides: 143 mg/dL (ref <150)

## 2022-05-06 LAB — MICROALBUMIN / CREATININE URINE RATIO
Creatinine, Urine: 177 mg/dL (ref 20–275)
Microalb Creat Ratio: 5 mcg/mg creat (ref <30)
Microalb, Ur: 0.8 mg/dL

## 2022-05-06 LAB — INSULIN, FREE (BIOACTIVE): Insulin, Free: 71.2 u[IU]/mL — ABNORMAL HIGH (ref 1.5–14.9)

## 2022-05-06 NOTE — Addendum Note (Signed)
Addended by: Delsa Grana on: 05/06/2022 04:09 PM   Modules accepted: Orders

## 2022-05-11 ENCOUNTER — Telehealth: Payer: Self-pay | Admitting: Family Medicine

## 2022-05-11 NOTE — Telephone Encounter (Signed)
Copied from [place] [phone]. Topic: General - Other >> May 11, 2022 11:57 AM Sab[place] Sous wrote: Reason for CRM: Needs recent labs for thyroid faxed over to Dr. De Hollingshead office to complete referral.   Fax: [phone] Attn: Morey Hummingbird

## 2022-06-05 ENCOUNTER — Encounter: Payer: Self-pay | Admitting: Family Medicine

## 2022-06-08 MED ORDER — ATORVASTATIN CALCIUM 20 MG PO TABS
20.0000 mg | ORAL_TABLET | Freq: Every day | ORAL | 3 refills | Status: DC
Start: 2022-06-08 — End: 2023-03-13

## 2022-06-22 ENCOUNTER — Other Ambulatory Visit: Payer: Self-pay | Admitting: Family Medicine

## 2022-06-22 DIAGNOSIS — T7800XA Anaphylactic reaction due to unspecified food, initial encounter: Secondary | ICD-10-CM

## 2022-06-22 MED ORDER — EPINEPHRINE 0.3 MG/0.3ML IJ SOAJ
0.3000 mg | INTRAMUSCULAR | 0 refills | Status: AC | PRN
  Filled 2022-06-22: qty 2, 2d supply, fill #0
  Filled 2022-06-24 (×2): qty 2, 30d supply, fill #0

## 2022-06-23 ENCOUNTER — Encounter: Payer: Self-pay | Admitting: Oncology

## 2022-06-23 ENCOUNTER — Encounter (HOSPITAL_COMMUNITY): Payer: Self-pay | Admitting: Pharmacist

## 2022-06-23 ENCOUNTER — Other Ambulatory Visit: Payer: Self-pay

## 2022-06-23 ENCOUNTER — Other Ambulatory Visit (HOSPITAL_COMMUNITY): Payer: Self-pay

## 2022-06-24 ENCOUNTER — Other Ambulatory Visit: Payer: Self-pay

## 2022-06-24 ENCOUNTER — Encounter (HOSPITAL_COMMUNITY): Payer: Self-pay | Admitting: Pharmacist

## 2022-06-24 ENCOUNTER — Other Ambulatory Visit (HOSPITAL_COMMUNITY): Payer: Self-pay

## 2022-06-24 MED ORDER — VORTIOXETINE HBR 20 MG PO TABS
20.0000 mg | ORAL_TABLET | Freq: Every day | ORAL | 3 refills | Status: DC
Start: 2022-06-24 — End: 2023-07-20

## 2022-07-01 ENCOUNTER — Encounter: Payer: Self-pay | Admitting: Family Medicine

## 2022-07-02 NOTE — Telephone Encounter (Signed)
Faxed over requesting records at Feeling great for patient.

## 2022-07-15 ENCOUNTER — Encounter (INDEPENDENT_AMBULATORY_CARE_PROVIDER_SITE_OTHER): Payer: 59 | Admitting: Family Medicine

## 2022-07-15 DIAGNOSIS — G4733 Obstructive sleep apnea (adult) (pediatric): Secondary | ICD-10-CM

## 2022-07-17 ENCOUNTER — Encounter: Payer: Self-pay | Admitting: Family Medicine

## 2022-07-17 NOTE — Progress Notes (Unsigned)
Document review  Pt with hx of OSA, sleep med info received, pt need new CPAP supply orders - study and titration was recent - have reviewed documents, annotated, asked staff to prep the OSA CPAP order with these setting noted, and send to supply company.    ICD-10-CM   1. OSA on CPAP  G47.33    records recieved, new CPAP order with prior setting to be ordered - pt needs new supplies     Encounter and need for orders and f/up on this routed to [place] and [place] just to f/up if they have worked on this already or not   Delsa Grana, PA-C 07/17/22 8:51 PM

## 2022-07-20 ENCOUNTER — Other Ambulatory Visit: Payer: Self-pay

## 2022-07-20 DIAGNOSIS — G4733 Obstructive sleep apnea (adult) (pediatric): Secondary | ICD-10-CM

## 2022-07-20 NOTE — Progress Notes (Signed)
Spoke to pt and told her will be sending order to [facility] for order, she should be hearing from them. Pt verbalized understanding

## 2022-07-23 LAB — HM COLONOSCOPY

## 2022-07-27 ENCOUNTER — Telehealth: Payer: Self-pay | Admitting: Family Medicine

## 2022-07-27 NOTE — Telephone Encounter (Signed)
Copied from [place] [phone]. Topic: General - Other >> Jul 27, 2022 11:05 AM Chapman Fitch wrote: Reason for CRM: [employer] needs a copy of the pts sleep study for the Cpap order they received / the one they have is not readable /please fax copy to [phone]/ they also needs clinical notes showing the pt benefits from using the machine / please advise

## 2022-07-27 NOTE — Telephone Encounter (Signed)
Called pt no answer left vm to return my call.  Patient needs to be informed she needs an appointment for documentation per insurance for her cpap machine and supplies. She needs the appointment to follow up on her OSA.

## 2022-08-10 ENCOUNTER — Ambulatory Visit: Payer: 59 | Admitting: *Deleted

## 2022-09-07 ENCOUNTER — Telehealth: Payer: Self-pay | Admitting: Family Medicine

## 2022-09-07 NOTE — Telephone Encounter (Signed)
Pt needs an appointment, please have her get scheduled. Pt is aware.

## 2022-09-07 NOTE — Telephone Encounter (Signed)
Copied from [place] [phone]. Topic: General - Other >> Sep 07, 2022  2:31 PM Leone Payor F wrote: Reason for CRM: Patient is calling in to follow up on her CPAP machine. Patient says she was working with someone regarding the CPAP machine, but had some personal issues that prevented her from following up. Patient would like to know the status of the machine. Please advise.

## 2022-09-08 NOTE — Telephone Encounter (Signed)
Lvm for pt to call back and schedule an appt

## 2022-09-11 ENCOUNTER — Encounter: Payer: 59 | Attending: Endocrinology | Admitting: *Deleted

## 2022-09-11 ENCOUNTER — Encounter: Payer: Self-pay | Admitting: *Deleted

## 2022-09-11 VITALS — BP 136/82 | Ht 63.0 in | Wt 276.0 lb

## 2022-09-11 DIAGNOSIS — Z713 Dietary counseling and surveillance: Secondary | ICD-10-CM | POA: Insufficient documentation

## 2022-09-11 DIAGNOSIS — E119 Type 2 diabetes mellitus without complications: Secondary | ICD-10-CM | POA: Insufficient documentation

## 2022-09-11 NOTE — Patient Instructions (Signed)
Check blood sugars before breakfast, 2 hrs after meals and as needed with Dexcom  Exercise:  Continue program for    30-60  minutes   2-4  days a week  Eat 3 meals day,   1-2  snacks a day Space meals 4-6 hours apart Limit desserts/sweets to 2-3 x week Increase water to 4-5 servings/day  Carry fast acting glucose and a snack at all times  Call back if you want to come to Diabetes classes or an appointment with the dietitian

## 2022-09-11 NOTE — Progress Notes (Signed)
Diabetes Self-Management Education  Visit Type: First/Initial  Appt. Start Time: 1310 Appt. End Time: 1430  09/11/2022  [name], identified by name and date of birth, is a 47 y.o. female with a diagnosis of Diabetes: Type 2.   ASSESSMENT  Blood pressure 136/82, height '5\' 3"'$  (1.6 m), weight 276 lb (125.2 kg), last menstrual period 05/23/2019. Body mass index is 48.89 kg/m.   Diabetes Self-Management Education - 09/11/22 1443       Visit Information   Visit Type First/Initial      Initial Visit   Diabetes Type Type 2    Date Diagnosed "a few weeks ago"    Are you currently following a meal plan? No    Are you taking your medications as prescribed? Yes      Health Coping   How would you rate your overall health? Good      Psychosocial Assessment   Patient Belief/Attitude about Diabetes Motivated to manage diabetes   "sad, limited"   What is the hardest part about your diabetes right now, causing you the most concern, or is the most worrisome to you about your diabetes?   Making healty food and beverage choices    Self-care barriers None    Self-management support Doctor's office;Family    Patient Concerns Nutrition/Meal planning;Monitoring;Weight Control;Healthy Lifestyle    Special Needs None    Preferred Learning Style Hands on;Other (comment)   talking/discussion   Learning Readiness Change in progress    How often do you need to have someone help you when you read instructions, pamphlets, or other written materials from your doctor or pharmacy? 1 - Never    What is the last grade level you completed in school? BS      Pre-Education Assessment   Patient understands the diabetes disease and treatment process. Needs Instruction    Patient understands incorporating nutritional management into lifestyle. Needs Instruction    Patient undertands incorporating physical activity into lifestyle. Needs Review    Patient understands using medications safely. Needs  Instruction    Patient understands monitoring blood glucose, interpreting and using results Comprehends key points    Patient understands prevention, detection, and treatment of acute complications. Needs Instruction    Patient understands prevention, detection, and treatment of chronic complications. Needs Instruction    Patient understands how to develop strategies to address psychosocial issues. Needs Instruction    Patient understands how to develop strategies to promote health/change behavior. Needs Instruction      Complications   Last HgB A1C per patient/outside source 7.2 %   06/25/2022   How often do you check your blood sugar? > 4 times/day   Pt has Dexcom G7. 30 day profile - 145 mg/dL, SD 28 mg/dL, GMI 6.8%, Very high <1%, High 13%, TIR 86%   Number of hypoglycemic episodes per month 1    Can you tell when your blood sugar is low? Yes   she felt sick and the alarm woke her up - she reports reading of 55 mg/dL   What do you do if your blood sugar is low? she reports that she ate something - she now has glucose tablets    Have you had a dilated eye exam in the past 12 months? No   She just got a reminder letter from eye doctor office and will schedule.   Have you had a dental exam in the past 12 months? Yes    Are you checking your feet? No  Dietary Intake   Breakfast Greek yogurt, banana, SF chocolate syrup, bacon; cheese stick, overnight oaks and Fairlife milk with protein, chia, hemp and flaxseeds, berries (reports that she loves all fruits)    Snack (morning) snack may consist of sweets or nuts, cheese and crackers, veggies and dip    Lunch left-overs, soup, salad - lettuce, cuccumbers, tomatoes, tortilla chips, seeds, croutons, cheese, blue cheese dressing    Dinner fish, pork, beef, sushi, [place]; potatoes, beans, peas, corn, rice, pasta, green beans,. broccoli, cauliflower - reports that she loves vegetables    Beverage(s) water, Crystal light, coffee, bubbly water that is  SF      Activity / Exercise   Activity / Exercise Type Moderate (swimming / aerobic walking)   weights, water aerobics, walking   How many days per week do you exercise? 3    How many minutes per day do you exercise? 45    Total minutes per week of exercise 135      Patient Education   Previous Diabetes Education No    Disease Pathophysiology Definition of diabetes, type 1 and 2, and the diagnosis of diabetes;Factors that contribute to the development of diabetes;Explored patient's options for treatment of their diabetes    Healthy Eating Role of diet in the treatment of diabetes and the relationship between the three main macronutrients and blood glucose level;Food label reading, portion sizes and measuring food.;Carbohydrate counting;Reviewed blood glucose goals for pre and post meals and how to evaluate the patients' food intake on their blood glucose level.    Being Active Role of exercise on diabetes management, blood pressure control and cardiac health.    Medications Other (comment)   Pt has been on GLP-1 in the past and had bad side effects. Metformin also is not tolerated. She currently is not taking any Diabetes medications.   Monitoring Taught/evaluated CGM (comment);Taught/discussed recording of test results and interpretation of SMBG.;Identified appropriate SMBG and/or A1C goals.    Acute complications Taught prevention, symptoms, and  treatment of hypoglycemia - the 15 rule.    Chronic complications Relationship between chronic complications and blood glucose control    Diabetes Stress and Support Identified and addressed patients feelings and concerns about diabetes;Role of stress on diabetes      Individualized Goals (developed by patient)   Reducing Risk Other (comment)   prevent diabetes complications, lose weight, lead a healthier lifestyle, become more fit and more active     Outcomes   Expected Outcomes Demonstrated interest in learning. Expect positive outcomes     Program Status Not Completed         Individualized Plan for Diabetes Self-Management Training:   Learning Objective:  Patient will have a greater understanding of diabetes self-management. Patient education plan is to attend individual and/or group sessions per assessed needs and concerns.   Plan:   Patient Instructions  Check blood sugars before breakfast, 2 hrs after meals and as needed with Dexcom  Exercise:  Continue program for    30-60  minutes   2-4  days a week  Eat 3 meals day,   1-2  snacks a day Space meals 4-6 hours apart Limit desserts/sweets to 2-3 x week Increase water to 4-5 servings/day  Carry fast acting glucose and a snack at all times  Call back if you want to come to Diabetes classes or an appointment with the dietitian  Expected Outcomes:  Demonstrated interest in learning. Expect positive outcomes  Education material provided:  General Meal Planning  Guidelines Simple Meal Plan Symptoms, causes and treatments of Hypoglycemia Dexcom AVS sheet  If problems or questions, patient to contact team via:   Johny Drilling, RN, CCM, [place] [phone]  Future DSME appointment:  PRN

## 2022-09-12 ENCOUNTER — Other Ambulatory Visit: Payer: Self-pay | Admitting: Family Medicine

## 2022-09-25 ENCOUNTER — Encounter: Payer: Self-pay | Admitting: Family Medicine

## 2022-10-27 ENCOUNTER — Ambulatory Visit (INDEPENDENT_AMBULATORY_CARE_PROVIDER_SITE_OTHER): Payer: 59 | Admitting: Family Medicine

## 2022-10-27 VITALS — BP 126/76 | HR 79 | Temp 98.0°F | Resp 16 | Ht 64.0 in | Wt 277.4 lb

## 2022-10-27 DIAGNOSIS — E039 Hypothyroidism, unspecified: Secondary | ICD-10-CM | POA: Diagnosis not present

## 2022-10-27 DIAGNOSIS — G4733 Obstructive sleep apnea (adult) (pediatric): Secondary | ICD-10-CM

## 2022-10-27 DIAGNOSIS — E8881 Metabolic syndrome: Secondary | ICD-10-CM

## 2022-10-27 DIAGNOSIS — E785 Hyperlipidemia, unspecified: Secondary | ICD-10-CM | POA: Diagnosis not present

## 2022-10-27 DIAGNOSIS — E119 Type 2 diabetes mellitus without complications: Secondary | ICD-10-CM | POA: Diagnosis not present

## 2022-10-27 DIAGNOSIS — E66813 Obesity, class 3: Secondary | ICD-10-CM

## 2022-10-27 DIAGNOSIS — Z23 Encounter for immunization: Secondary | ICD-10-CM

## 2022-10-27 DIAGNOSIS — Z6841 Body Mass Index (BMI) 40.0 and over, adult: Secondary | ICD-10-CM

## 2022-10-27 DIAGNOSIS — Z5181 Encounter for therapeutic drug level monitoring: Secondary | ICD-10-CM

## 2022-10-27 DIAGNOSIS — R112 Nausea with vomiting, unspecified: Secondary | ICD-10-CM

## 2022-10-27 DIAGNOSIS — F329 Major depressive disorder, single episode, unspecified: Secondary | ICD-10-CM | POA: Diagnosis not present

## 2022-10-27 NOTE — Progress Notes (Signed)
Name: Joanna Scott   MRN: [id number]    DOB: 06/15/1976   Date:10/27/2022       Progress Note  Chief Complaint  Patient presents with   Follow-up   Diabetes   Hyperlipidemia   Hypothyroidism   Depression     Subjective:   Joanna Scott is a 47 y.o. female, presents to clinic for routine f/up and she needs help with orders for supplies for CPAP machine  Sleep study done 5 years ago,she has no supplies and isn't seeing anyone for follow up She brings in her report showing compliance and effectiveness (see photo below PE - or see document scanned into chart)   [facility] - does her DM eye exam there Seeing Dr. Johny Chess for PCOS, morbid obesity, diabetes -he has recommended that she tries [facility], with history of gastroparesis she previously did not tolerate GLP-1's She did do consultation again with a registered dietitian/nutritionist and has made a few adjustments to her diet she is lost a few pounds  On statin with history of DM and mildly elevated cholesterol, not on ACE inhibitor or ARB Unable to see all recent labs with review of most recent office visit  She is still requiring pantoprazole often and sometimes daily -needs refills  She continues to have increased stress in her life with the recent death of her sister to colorectal cancer, she will be the primary guardian for her nephew however he is currently staying in [place] with the patient's mother.  She continues to go back and forth out of state to help with her nephew, funeral arrangements, and multiple other things such as addressing estate/moving etc. She feels that she is grieving but overall her mood and anxiety are still well-controlled with her current [employer]    10/27/2022    8:36 AM 09/11/2022    1:19 PM 04/27/2022    2:06 PM  Depression screen PHQ 2/9  Decreased Interest 2 1 1   Down, Depressed, Hopeless 2 1 1   PHQ - 2 Score 4 2 2   Altered sleeping 0 0 0  Tired, decreased energy 3 2 0  Change in  appetite 0 0 0  Feeling bad or failure about yourself  1 1 0  Trouble concentrating 1 0 0  Moving slowly or fidgety/restless 0 0 0  Suicidal thoughts 0 0 0  PHQ-9 Score 9 5 2   Difficult doing work/chores Somewhat difficult Somewhat difficult Somewhat difficult  PHQ is higher in the setting of grief and loss     Current Outpatient Medications:    aspirin EC 81 MG tablet, Take 81 mg by mouth daily., Disp: , Rfl:    atorvastatin (LIPITOR) 20 MG tablet, Take 1 tablet (20 mg total) by mouth at bedtime., Disp: 90 tablet, Rfl: 3   cetirizine (ZYRTEC) 10 MG tablet, Take 1 tablet (10 mg total) by mouth daily as needed for allergies., Disp: , Rfl:    Continuous Blood Gluc Sensor (DEXCOM G7 SENSOR) MISC, CHANGE SENSOR DAILY FOR 10 DAYS, Disp: , Rfl:    CONTOUR NEXT TEST test strip, 3 (three) times daily., Disp: , Rfl:    EPINEPHrine 0.3 mg/0.3 mL IJ SOAJ injection, Inject 0.3 mg into the muscle as needed for anaphylaxis., Disp: 2 each, Rfl: 0   hydrocortisone 2.5 % cream, Apply 1 Application topically as needed., Disp: , Rfl:    ketoconazole (NIZORAL) 2 % shampoo, APPLY 1 APPLICATION TOPICALLY 2 (TWO) TIMES A WEEK., Disp: 120 mL, Rfl: 2   pantoprazole (PROTONIX) 40  MG tablet, TAKE 1 TABLET BY MOUTH EVERY DAY IN THE MORNING, Disp: 90 tablet, Rfl: 0   vortioxetine HBr (TRINTELLIX) 20 MG TABS tablet, Take 1 tablet (20 mg total) by mouth daily., Disp: 90 tablet, Rfl: 3  Patient Active Problem List   Diagnosis Date Noted   Fatty liver disease, nonalcoholic 12/24/2020   Atherosclerosis 12/24/2020   OSA on CPAP 09/23/2020   New onset type 2 diabetes mellitus 09/23/2020   Lynch syndrome 06/12/2020   Allergy with anaphylaxis due to food 06/12/2020   Metabolic syndrome 06/12/2020   Iron deficiency anemia 12/14/2019   Hypothyroidism 10/04/2019   Seborrheic dermatitis of scalp 10/04/2019   Class 3 severe obesity with serious comorbidity and body mass index (BMI) of 45.0 to 49.9 in adult 10/04/2019    Prothrombin gene mutation 10/04/2019   S/P total hysterectomy and bilateral salpingo-oophorectomy 06/26/2019   Hyperlipidemia 04/10/2019   Major depressive disorder with current active episode 04/10/2019   Allergic rhinitis 04/10/2019   PCOS (polycystic ovarian syndrome) 10/20/2012    Past Surgical History:  Procedure Laterality Date   CESAREAN SECTION     CESAREAN SECTION N/A 12/17/2014   Procedure: CESAREAN SECTION;  Surgeon: Conard Novak, MD;  Location: [facility] ORS;  Service: Obstetrics;  Laterality: N/A;   CYSTOSCOPY  06/26/2019   Procedure: CYSTOSCOPY;  Surgeon: Ward, Elenora Fender, MD;  Location: [facility] OR[facility];  Service: Gynecology;;   CYSTOSCOPY WITH STENT PLACEMENT Right 06/26/2019   Procedure: CYSTOSCOPY WITH STENT PLACEMENT, Cystotomy;  Surgeon: Ward, Elenora Fender, MD;  Location: [facility] OR[facility];  Service: Gynecology;  Laterality: Right;   DIAGNOSTIC LAPAROSCOPY     DILATION AND CURETTAGE OF UTERUS     LAPAROSCOPIC CHOLECYSTECTOMY     Lynch syndrome     ROBOTIC ASSISTED TOTAL HYSTERECTOMY WITH BILATERAL SALPINGO OOPHERECTOMY Bilateral 06/26/2019   Procedure: XI ROBOTIC ASSISTED TOTAL HYSTERECTOMY WITH BILATERAL SALPINGO OOPHORECTOMY;  Surgeon: Ward, Elenora Fender, MD;  Location: [facility] OR[facility];  Service: Gynecology;  Laterality: Bilateral;    Family History  Problem Relation Age of Onset   Hypertension Mother    Depression Mother    COPD Mother    Ovarian cancer Sister    Deep vein thrombosis Maternal Grandmother    Stroke Maternal Grandfather    Heart attack Maternal Grandfather    Malignant hyperthermia Cousin    Encopresis Son    Malignant hyperthermia Other    Breast cancer Neg Hx     Social History   Tobacco Use   Smoking status: Former    Packs/day: 1.00    Years: 5.00    Additional pack years: 0.00    Total pack years: 5.00    Types: Cigarettes   Smokeless tobacco: Never   Tobacco comments:    pt stopped at age 14  Vaping Use   Vaping Use: Never used  Substance Use Topics    Alcohol use: Yes    Comment: 2 times a year for special occasions   Drug use: No     Allergies  Allergen Reactions   Peach [Prunus Persica] Anaphylaxis and Nausea And Vomiting   Metformin And Related Other (See Comments)    Severe GI SE and intolerance   Vitex Itching    Sneezing and itchy eyes  [facility]let[facility]in and [facility] Maintenance  Topic Date Due   OPHTHALMOLOGY EXAM  Never done   COVID-19 Vaccine (5 - 2023-24 season) 11/12/2022 (Originally 03/13/2022)   HEMOGLOBIN A1C  10/28/2022   INFLUENZA VACCINE  02/11/2023  FOOT EXAM  04/28/2023   Diabetic kidney evaluation - eGFR measurement  04/29/2023   Diabetic kidney evaluation - Urine ACR  04/29/2023   COLONOSCOPY (Pts 45-77yrs Insurance coverage will need to be confirmed)  07/23/2032   DTaP/Tdap/Td (2 - Td or Tdap) 10/26/2032   Hepatitis C Screening  Completed   HIV Screening  Completed   HPV VACCINES  Aged Out   PAP SMEAR-Modifier  Discontinued    Chart Review Today: I personally reviewed active problem list, medication list, allergies, family history, social history, health maintenance, notes from last encounter, lab results, imaging with the patient/caregiver today.   Review of Systems  Constitutional: Negative.   HENT: Negative.    Eyes: Negative.   Respiratory: Negative.    Cardiovascular: Negative.   Gastrointestinal: Negative.   Endocrine: Negative.   Genitourinary: Negative.   Musculoskeletal: Negative.   Skin: Negative.   Allergic/Immunologic: Negative.   Neurological: Negative.   Hematological: Negative.   Psychiatric/Behavioral: Negative.    All other systems reviewed and are negative.    Objective:   Vitals:   10/27/22 0837  BP: 126/76  Pulse: 79  Resp: 16  Temp: 98 F (36.7 C)  TempSrc: Oral  SpO2: 96%  Weight: 277 lb 6.4 oz (125.8 kg)  Height: 5\' 4"  (1.626 m)    Body mass index is 47.62 kg/m.  Physical Exam Vitals and nursing note reviewed.  Constitutional:      General: She is not  in acute distress.    Appearance: Normal appearance. She is well-developed. She is obese. She is not ill-appearing, toxic-appearing or diaphoretic.  HENT:     Head: Normocephalic and atraumatic.     Nose: Nose normal.  Eyes:     General:        Right eye: No discharge.        Left eye: No discharge.     Conjunctiva/sclera: Conjunctivae normal.  Neck:     Trachea: No tracheal deviation.  Cardiovascular:     Rate and Rhythm: Normal rate and regular rhythm.     Pulses: Normal pulses.     Heart sounds: Normal heart sounds.  Pulmonary:     Effort: Pulmonary effort is normal. No respiratory distress.     Breath sounds: Normal breath sounds. No stridor.  Skin:    General: Skin is warm and dry.     Findings: No rash.  Neurological:     Mental Status: She is alert. Mental status is at baseline.     Motor: No abnormal muscle tone.     Coordination: Coordination normal.  Psychiatric:        Mood and Affect: Mood normal.        Behavior: Behavior normal.         Assessment & Plan:   Problem List Items Addressed This Visit       Respiratory   OSA on CPAP    Good compliance and usage - AHI 0.47, average usage 7 h 39 min, and 100% for days usage Sleep study in chart from about 5 years ago She needs supplies -she has not had any supplies updated or available at all since she got her CPAP She is no longer seeing [facility] or any other sleep medicine specialist We have been attempting to redo the supply orders, and verify diagnosis and compliance        Endocrine   Hypothyroidism    Not currently on meds likely history of subclinical hypothyroid, repeat labs and continue  monitoring      Relevant Orders   TSH (Completed)   New onset type 2 diabetes mellitus    She has established with Dr. Moriarty/endocrinology and she is very satisfied with her care with him Reviewed her most recent office visit however cannot see all of the labs values and dates She is due for diabetic  eye exam -request records with [facility]        Other   Hyperlipidemia - Primary    Good statin compliance, tolerating, no side effects or concerns      Relevant Orders   COMPLETE METABOLIC PANEL WITH GFR (Completed)   Lipid panel (Completed)   Major depressive disorder with current active episode    Patient's PHQ-9 score is slightly elevated today but appropriate in the setting of grief and loss the recent death of her sister No med changes Supportive care given today, previously we had discussed local grief resources that are available      Class 3 severe obesity with serious comorbidity and body mass index (BMI) of 45.0 to 49.9 in adult   Relevant Orders   COMPLETE METABOLIC PANEL WITH GFR (Completed)   Lipid panel (Completed)   Hemoglobin A1c (Completed)   Metabolic syndrome    She is consulted with registered dietitian, adjusted diet lifestyle efforts she continues to work out and is extremely healthy and active Not currently on metformin Endo has recommended Mounjaro      Relevant Orders   COMPLETE METABOLIC PANEL WITH GFR (Completed)   Lipid panel (Completed)   Hemoglobin A1c (Completed)   TSH (Completed)   Other Visit Diagnoses     Type 2 diabetes mellitus without complication, without long-term current use of insulin       Relevant Orders   COMPLETE METABOLIC PANEL WITH GFR (Completed)   Hemoglobin A1c (Completed)   Encounter for medication monitoring       Relevant Orders   COMPLETE METABOLIC PANEL WITH GFR (Completed)   Lipid panel (Completed)   Hemoglobin A1c (Completed)   TSH (Completed)   Need for Tdap vaccination       Relevant Orders   Tdap vaccine greater than or equal to 7yo IM (Completed)   Nausea and vomiting, unspecified vomiting type       specialist recommended she get tested for meat allergy   Relevant Orders   Alpha-Gal Panel        Return in about 6 months (around 04/28/2023) for Routine follow-up.   Danelle Berry, PA-C 10/27/22  9:24 AM

## 2022-10-28 ENCOUNTER — Encounter: Payer: Self-pay | Admitting: Family Medicine

## 2022-10-28 MED ORDER — PANTOPRAZOLE SODIUM 40 MG PO TBEC: DELAYED_RELEASE_TABLET | ORAL | 3 refills | Status: DC

## 2022-10-28 NOTE — Assessment & Plan Note (Signed)
Patient's PHQ-9 score is slightly elevated today but appropriate in the setting of grief and loss the recent death of her sister No med changes Supportive care given today, previously we had discussed local grief resources that are available

## 2022-10-28 NOTE — Assessment & Plan Note (Signed)
Good compliance and usage - AHI 0.47, average usage 7 h 39 min, and 100% for days usage Sleep study in chart from about 5 years ago She needs supplies -she has not had any supplies updated or available at all since she got her CPAP She is no longer seeing [facility] or any other sleep medicine specialist We have been attempting to redo the supply orders, and verify diagnosis and compliance

## 2022-10-28 NOTE — Assessment & Plan Note (Signed)
She has established with Dr. Moriarty/endocrinology and she is very satisfied with her care with him Reviewed her most recent office visit however cannot see all of the labs values and dates She is due for diabetic eye exam -request records with [facility]

## 2022-10-28 NOTE — Assessment & Plan Note (Signed)
She is consulted with registered dietitian, adjusted diet lifestyle efforts she continues to work out and is extremely healthy and active Not currently on metformin Endo has recommended [employer]

## 2022-10-28 NOTE — Assessment & Plan Note (Signed)
Not currently on meds likely history of subclinical hypothyroid, repeat labs and continue monitoring

## 2022-10-28 NOTE — Assessment & Plan Note (Signed)
Good statin compliance, tolerating, no side effects or concerns

## 2022-10-31 LAB — LIPID PANEL
Cholesterol: 152 mg/dL (ref <200)
HDL: 37 mg/dL — ABNORMAL LOW (ref >=50)
LDL Cholesterol (Calc): 92 mg/dL (calc)
Non-HDL Cholesterol (Calc): 115 mg/dL (calc) (ref <130)
Total CHOL/HDL Ratio: 4.1 (calc) (ref <5.0)
Triglycerides: 136 mg/dL (ref <150)

## 2022-10-31 LAB — COMPLETE METABOLIC PANEL WITH GFR
AG Ratio: 1.6 (calc) (ref 1.0–2.5)
ALT: 25 U/L (ref 6–29)
AST: 16 U/L (ref 10–35)
Albumin: 4.4 g/dL (ref 3.6–5.1)
Alkaline phosphatase (APISO): 103 U/L (ref 31–125)
BUN: 13 mg/dL (ref 7–25)
CO2: 31 mmol/L (ref 20–32)
Calcium: 9.6 mg/dL (ref 8.6–10.2)
Chloride: 104 mmol/L (ref 98–110)
Creat: 0.83 mg/dL (ref 0.50–0.99)
Globulin: 2.7 g/dL (calc) (ref 1.9–3.7)
Glucose, Bld: 115 mg/dL — ABNORMAL HIGH (ref 65–99)
Potassium: 4.5 mmol/L (ref 3.5–5.3)
Sodium: 142 mmol/L (ref 135–146)
Total Bilirubin: 0.4 mg/dL (ref 0.2–1.2)
Total Protein: 7.1 g/dL (ref 6.1–8.1)
eGFR: 88 mL/min/{1.73_m2} (ref >=60)

## 2022-10-31 LAB — HEMOGLOBIN A1C
Hgb A1c MFr Bld: 6.8 % of total Hgb — ABNORMAL HIGH (ref <5.7)
Mean Plasma Glucose: 148 mg/dL
eAG (mmol/L): 8.2 mmol/L

## 2022-10-31 LAB — ALPHA-GAL PANEL
Allergen, Mutton, f88: <0.1 kU/L
Allergen, Pork, f26: <0.1 kU/L
Beef: <0.1 kU/L
CLASS: 0
CLASS: 0
Class: 0
GALACTOSE-ALPHA-1,3-GALACTOSE IGE*: <0.1 kU/L (ref <0.10)

## 2022-10-31 LAB — TSH: TSH: 3.13 mIU/L

## 2022-10-31 LAB — INTERPRETATION:

## 2022-11-30 ENCOUNTER — Encounter: Payer: Self-pay | Admitting: Family Medicine

## 2023-01-06 ENCOUNTER — Ambulatory Visit: Payer: 59 | Admitting: Physician Assistant

## 2023-02-09 ENCOUNTER — Telehealth: Payer: Self-pay

## 2023-02-09 NOTE — Telephone Encounter (Signed)
No answer from pt [facility], would like to know what is she currently on for CPAP pressure setting?

## 2023-02-11 ENCOUNTER — Ambulatory Visit: Payer: 59 | Admitting: Family Medicine

## 2023-02-12 ENCOUNTER — Other Ambulatory Visit: Payer: Self-pay | Admitting: Family Medicine

## 2023-02-12 DIAGNOSIS — Z1231 Encounter for screening mammogram for malignant neoplasm of breast: Secondary | ICD-10-CM

## 2023-02-12 NOTE — Telephone Encounter (Signed)
Pt returned Joanna Scott's call stated CPAP pressure setting. Currently on 10.  Stated received a box of supplies today.    Please advise.

## 2023-02-12 NOTE — Telephone Encounter (Signed)
Form filled out waiting on PCP to sign.

## 2023-02-26 ENCOUNTER — Ambulatory Visit
Admission: RE | Admit: 2023-02-26 | Discharge: 2023-02-26 | Disposition: A | Discharge status: Home / Self Care | Payer: 59 | Source: Ambulatory Visit | Attending: Family Medicine | Admitting: Family Medicine

## 2023-02-26 DIAGNOSIS — Z1231 Encounter for screening mammogram for malignant neoplasm of breast: Secondary | ICD-10-CM | POA: Insufficient documentation

## 2023-03-13 ENCOUNTER — Other Ambulatory Visit: Payer: Self-pay | Admitting: Family Medicine

## 2023-04-28 ENCOUNTER — Telehealth: Payer: Self-pay | Admitting: Family Medicine

## 2023-04-28 ENCOUNTER — Ambulatory Visit (INDEPENDENT_AMBULATORY_CARE_PROVIDER_SITE_OTHER): Payer: 59 | Admitting: Physician Assistant

## 2023-04-28 ENCOUNTER — Ambulatory Visit: Payer: 59 | Admitting: Family Medicine

## 2023-04-28 ENCOUNTER — Encounter: Payer: Self-pay | Admitting: Physician Assistant

## 2023-04-28 VITALS — BP 122/84 | HR 84 | Temp 98.2°F | Resp 16 | Wt 242.6 lb

## 2023-04-28 DIAGNOSIS — E039 Hypothyroidism, unspecified: Secondary | ICD-10-CM | POA: Diagnosis not present

## 2023-04-28 DIAGNOSIS — E119 Type 2 diabetes mellitus without complications: Secondary | ICD-10-CM

## 2023-04-28 DIAGNOSIS — M79645 Pain in left finger(s): Secondary | ICD-10-CM

## 2023-04-28 DIAGNOSIS — Z23 Encounter for immunization: Secondary | ICD-10-CM | POA: Diagnosis not present

## 2023-04-28 DIAGNOSIS — E66813 Obesity, class 3: Secondary | ICD-10-CM

## 2023-04-28 DIAGNOSIS — E785 Hyperlipidemia, unspecified: Secondary | ICD-10-CM | POA: Diagnosis not present

## 2023-04-28 DIAGNOSIS — Z6841 Body Mass Index (BMI) 40.0 and over, adult: Secondary | ICD-10-CM

## 2023-04-28 DIAGNOSIS — Z7985 Long-term (current) use of injectable non-insulin antidiabetic drugs: Secondary | ICD-10-CM

## 2023-04-28 DIAGNOSIS — E282 Polycystic ovarian syndrome: Secondary | ICD-10-CM

## 2023-04-28 NOTE — Telephone Encounter (Signed)
Pt.notified

## 2023-04-28 NOTE — Assessment & Plan Note (Addendum)
She is taking compounded Mounjaro 7.5 mg weekly injection  She reports good results - she has lost about 30 lbs since her last apt in office  Encouraged her to continue with diet and exercise  Continue Mounjaro weekly injection Follow-up in 3 months or sooner if concerns arise

## 2023-04-28 NOTE — Telephone Encounter (Signed)
Requesting a return call. She is trying to set up her dexcom account on her cell and it is asking her for the clinic code. Do anyone happen to know this information?

## 2023-04-28 NOTE — Assessment & Plan Note (Signed)
Chronic, historic  Appears well managed with current regimen comprised of atorvastatin 20 mg PO every day  Continue current regimen Recheck lipid panel today. Results to dictate further management  Follow up in 6 months or sooner if concerns arise

## 2023-04-28 NOTE — Progress Notes (Signed)
Established Patient Office Visit  Name: Joanna Scott   MRN: [id number]    DOB: 11/11/1975   Date:04/30/2023  Today's Provider: Jacquelin Hawking, MHS, PA-C Introduced myself to the patient as a PA-C and provided education on APPs in clinical practice.         Subjective  Chief Complaint  Chief Complaint  Patient presents with   Diabetes   Hyperlipidemia   Hypothyroidism    HPI  HYPERTENSION / HYPERLIPIDEMIA Satisfied with current treatment? yes Duration of hypertension: chronic BP monitoring frequency: not checking BP range:  BP medication side effects: no Past BP meds: none  Duration of hyperlipidemia: chronic Cholesterol medication side effects: yes Cholesterol supplements: none Past cholesterol medications: atorvastain (lipitor) Medication compliance: good compliance Aspirin: yes Recent stressors: no Recurrent headaches: no Visual changes: no Palpitations: no Dyspnea: no Chest pain: no Lower extremity edema: no Dizzy/lightheaded: no  Diabetes, Type 2 She was seeing Endo for management- Dr. Ferdinand Lango  - Last A1c 6.8 - Medications: Mounjaro 7.5 mg weekly injection, - she is doing compounded version due to cost  - Compliance: good  - Checking BG at home: She is using Dexcom for monitoring- usually running 104 on avg from Dexcom - Diet: Overall normal dietary intake but has worked with [employer], is trying to increase protein, fiber and water  - Exercise: she is exercising with personal trainer 3 x week and tries to walk everyday  - Eye exam: referral for Ophthalmology - Foot exam: completed today  - Microalbumin: ordered today  - Statin: on statin  - PNA vaccine: NA - Denies symptoms of hypoglycemia, polyuria, polydipsia, numbness extremities, foot ulcers/trauma  She reports ongoing left index finger pain for several months States she notices this when pressing down or using the distal knuckle -like knocking on a door ROM intact and strength  intact   Patient Active Problem List   Diagnosis Date Noted   Finger pain, left 04/30/2023   Fatty liver disease, nonalcoholic 12/24/2020   Atherosclerosis 12/24/2020   OSA on CPAP 09/23/2020   Type 2 diabetes mellitus without complication, without long-term current use of insulin (HCC) 09/23/2020   Lynch syndrome 06/12/2020   Allergy with anaphylaxis due to food 06/12/2020   Metabolic syndrome 06/12/2020   Iron deficiency anemia 12/14/2019   Hypothyroidism 10/04/2019   Seborrheic dermatitis of scalp 10/04/2019   Class 3 severe obesity due to excess calories with body mass index (BMI) of 40.0 to 44.9 in adult (HCC) 10/04/2019   Prothrombin gene mutation (HCC) 10/04/2019   S/P total hysterectomy and bilateral salpingo-oophorectomy 06/26/2019   Hyperlipidemia 04/10/2019   Major depressive disorder with current active episode 04/10/2019   Allergic rhinitis 04/10/2019   PCOS (polycystic ovarian syndrome) 10/20/2012    Past Surgical History:  Procedure Laterality Date   CESAREAN SECTION     CESAREAN SECTION N/A 12/17/2014   Procedure: CESAREAN SECTION;  Surgeon: Conard Novak, MD;  Location: [facility];  Service: Obstetrics;  Laterality: N/A;   CYSTOSCOPY  06/26/2019   Procedure: CYSTOSCOPY;  Surgeon: Ward, Elenora Fender, MD;  Location: [facility];  Service: Gynecology;;   CYSTOSCOPY WITH STENT PLACEMENT Right 06/26/2019   Procedure: CYSTOSCOPY WITH STENT PLACEMENT, Cystotomy;  Surgeon: Ward, Elenora Fender, MD;  Location: [facility];  Service: Gynecology;  Laterality: Right;   DIAGNOSTIC LAPAROSCOPY     DILATION AND CURETTAGE OF UTERUS     LAPAROSCOPIC CHOLECYSTECTOMY     Lynch syndrome  ROBOTIC ASSISTED TOTAL HYSTERECTOMY WITH BILATERAL SALPINGO OOPHERECTOMY Bilateral 06/26/2019   Procedure: XI ROBOTIC ASSISTED TOTAL HYSTERECTOMY WITH BILATERAL SALPINGO OOPHORECTOMY;  Surgeon: Ward, Elenora Fender, MD;  Location: [facility];  Service: Gynecology;  Laterality: Bilateral;    Family History   Problem Relation Age of Onset   Hypertension Mother    Depression Mother    COPD Mother    Ovarian cancer Sister    Deep vein thrombosis Maternal Grandmother    Stroke Maternal Grandfather    Heart attack Maternal Grandfather    Malignant hyperthermia Cousin    Encopresis Son    Malignant hyperthermia Other    Breast cancer Neg Hx     Social History   Tobacco Use   Smoking status: Former    Current packs/day: 1.00    Average packs/day: 1 pack/day for 5.0 years (5.0 ttl pk-yrs)    Types: Cigarettes   Smokeless tobacco: Never   Tobacco comments:    pt stopped at age 86  Substance Use Topics   Alcohol use: Yes    Comment: 2 times a year for special occasions     Current Outpatient Medications:    aspirin EC 81 MG tablet, Take 81 mg by mouth daily., Disp: , Rfl:    atorvastatin (LIPITOR) 20 MG tablet, TAKE 1 TABLET BY MOUTH EVERYDAY AT BEDTIME, Disp: 90 tablet, Rfl: 3   cetirizine (ZYRTEC) 10 MG tablet, Take 1 tablet (10 mg total) by mouth daily as needed for allergies., Disp: , Rfl:    Continuous Blood Gluc Sensor (DEXCOM G7 SENSOR) MISC, CHANGE SENSOR DAILY FOR 10 DAYS, Disp: , Rfl:    CONTOUR NEXT TEST test strip, 3 (three) times daily., Disp: , Rfl:    EPINEPHrine 0.3 mg/0.3 mL IJ SOAJ injection, Inject 0.3 mg into the muscle as needed for anaphylaxis., Disp: 2 each, Rfl: 0   hydrocortisone 2.5 % cream, Apply 1 Application topically as needed., Disp: , Rfl:    ketoconazole (NIZORAL) 2 % shampoo, APPLY 1 APPLICATION TOPICALLY 2 (TWO) TIMES A WEEK., Disp: 120 mL, Rfl: 2   pantoprazole (PROTONIX) 40 MG tablet, TAKE 1 TABLET BY MOUTH EVERY DAY IN THE MORNING, Disp: 90 tablet, Rfl: 3   tirzepatide (MOUNJARO) 7.5 MG/0.5ML Pen, Inject 7.5 mg into the skin once a week., Disp: , Rfl:    vortioxetine HBr (TRINTELLIX) 20 MG TABS tablet, Take 1 tablet (20 mg total) by mouth daily., Disp: 90 tablet, Rfl: 3  Allergies  Allergen Reactions   Peach [Prunus Persica] Anaphylaxis and  Nausea And Vomiting   Metformin And Related Other (See Comments)    Severe GI SE and intolerance   Vitex Itching    Sneezing and itchy eyes  Charletta Cousin and Oak    I personally reviewed active problem list, medication list, allergies, health maintenance, notes from last encounter, lab results with the patient/caregiver today.   Review of Systems  Constitutional:  Negative for chills, fever and weight loss.  Eyes:  Negative for blurred vision, double vision and photophobia.  Respiratory:  Negative for shortness of breath and wheezing.   Cardiovascular:  Negative for chest pain, palpitations and leg swelling.  Neurological:  Negative for dizziness, tingling, loss of consciousness, weakness and headaches.      Objective  Vitals:   04/28/23 0853  BP: 122/84  Pulse: 84  Resp: 16  Temp: 98.2 F (36.8 C)  TempSrc: Oral  SpO2: 96%  Weight: 242 lb 9.6 oz (110 kg)    Body mass index  is 41.64 kg/m.  Physical Exam Vitals reviewed.  Constitutional:      General: She is awake.     Appearance: Normal appearance. She is well-developed and well-groomed.  HENT:     Head: Normocephalic and atraumatic.  Cardiovascular:     Rate and Rhythm: Normal rate and regular rhythm.     Pulses: Normal pulses.          Radial pulses are 2+ on the right side and 2+ on the left side.     Heart sounds: Normal heart sounds. No murmur heard.    No friction rub. No gallop.  Pulmonary:     Effort: Pulmonary effort is normal.     Breath sounds: Normal breath sounds. No decreased air movement. No decreased breath sounds, wheezing, rhonchi or rales.  Musculoskeletal:     Right lower leg: No edema.     Left lower leg: No edema.     Comments: Left hand - normal ROM  Left fingers have normal ROM and 5/5 strength Tenderness to palpation noted along DIP joint of left index finger but no swelling, erythema, nodules or increased warmth   Neurological:     Mental Status: She is alert and oriented to person,  place, and time.     GCS: GCS eye subscore is 4. GCS verbal subscore is 5. GCS motor subscore is 6.     Cranial Nerves: No cranial nerve deficit, dysarthria or facial asymmetry.     Gait: Gait is intact.  Psychiatric:        Attention and Perception: Attention and perception normal.        Mood and Affect: Mood and affect normal.        Speech: Speech normal.        Behavior: Behavior normal. Behavior is cooperative.        Thought Content: Thought content normal.        Cognition and Memory: Cognition normal.        Judgment: Judgment normal.      Recent Results (from the past 2160 hour(s))  Hemoglobin A1c     Status: None   Collection Time: 04/29/23  9:10 AM  Result Value Ref Range   Hgb A1c MFr Bld 5.5 <5.7 % of total Hgb    Comment: For the purpose of screening for the presence of diabetes: . <5.7%       Consistent with the absence of diabetes 5.7-6.4%    Consistent with increased risk for diabetes             (prediabetes) > or =6.5%  Consistent with diabetes . This assay result is consistent with a decreased risk of diabetes. . Currently, no consensus exists regarding use of hemoglobin A1c for diagnosis of diabetes in children. . According to American Diabetes Association (ADA) guidelines, hemoglobin A1c <7.0% represents optimal control in non-pregnant diabetic patients. Different metrics may apply to specific patient populations.  Standards of Medical Care in Diabetes(ADA). .    Mean Plasma Glucose 111 mg/dL   eAG (mmol/L) 6.2 mmol/L  Microalbumin / creatinine urine ratio     Status: Abnormal   Collection Time: 04/29/23  9:10 AM  Result Value Ref Range   Creatinine, Urine 281 (H) 20 - 275 mg/dL   Microalb, Ur 0.7 mg/dL    Comment: Reference Range Not established    Microalb Creat Ratio 2 <30 mg/g creat    Comment: . The ADA defines abnormalities in albumin excretion as follows: Marland Kitchen Albuminuria Category  Result (mg/g creatinine) . Normal to Mildly  increased   <30 Moderately increased         30-299  Severely increased           > OR = 300 . The ADA recommends that at least two of three specimens collected within a 3-6 month period be abnormal before considering a patient to be within a diagnostic category.   TSH     Status: None   Collection Time: 04/29/23  9:10 AM  Result Value Ref Range   TSH 1.83 mIU/L    Comment:           Reference Range .           > or = 20 Years  0.40-4.50 .                Pregnancy Ranges           First trimester    0.26-2.66           Second trimester   0.55-2.73           Third trimester    0.43-2.91   T4     Status: None   Collection Time: 04/29/23  9:10 AM  Result Value Ref Range   T4, Total 8.4 5.1 - 11.9 mcg/dL  Lipid Profile     Status: Abnormal   Collection Time: 04/29/23  9:10 AM  Result Value Ref Range   Cholesterol 140 <200 mg/dL   HDL 39 (L) > OR = 50 mg/dL   Triglycerides 93 <244 mg/dL   LDL Cholesterol (Calc) 82 mg/dL (calc)    Comment: Reference range: <100 . Desirable range <100 mg/dL for primary prevention;   <70 mg/dL for patients with CHD or diabetic patients  with > or = 2 CHD risk factors. Marland Kitchen LDL-C is now calculated using the Martin-Hopkins  calculation, which is a validated novel method providing  better accuracy than the Friedewald equation in the  estimation of LDL-C.  Horald Pollen et al. Lenox Ahr. 0102;725(36): 2061-2068  (http://education.[facility].com/faq/FAQ164)    Total CHOL/HDL Ratio 3.6 <5.0 (calc)   Non-HDL Cholesterol (Calc) 101 <130 mg/dL (calc)    Comment: For patients with diabetes plus 1 major ASCVD risk  factor, treating to a non-HDL-C goal of <100 mg/dL  (LDL-C of <64 mg/dL) is considered a therapeutic  option.   COMPLETE METABOLIC PANEL WITH GFR     Status: None   Collection Time: 04/29/23  9:10 AM  Result Value Ref Range   Glucose, Bld 87 65 - 99 mg/dL    Comment: .            Fasting reference interval .    BUN 19 7 - 25 mg/dL    Creat 4.03 4.74 - 2.59 mg/dL   eGFR 78 > OR = 60 DG/LOV/5.64P3   BUN/Creatinine Ratio SEE NOTE: 6 - 22 (calc)    Comment:    Not Reported: BUN and Creatinine are within    reference range. .    Sodium 140 135 - 146 mmol/L   Potassium 4.2 3.5 - 5.3 mmol/L   Chloride 104 98 - 110 mmol/L   CO2 28 20 - 32 mmol/L   Calcium 9.3 8.6 - 10.2 mg/dL   Total Protein 7.0 6.1 - 8.1 g/dL   Albumin 4.6 3.6 - 5.1 g/dL   Globulin 2.4 1.9 - 3.7 g/dL (calc)   AG Ratio 1.9 1.0 - 2.5 (calc)   Total Bilirubin 0.5 0.2 -  1.2 mg/dL   Alkaline phosphatase (APISO) 85 31 - 125 U/L   AST 13 10 - 35 U/L   ALT 14 6 - 29 U/L  CBC w/Diff/Platelet     Status: Abnormal   Collection Time: 04/29/23  9:10 AM  Result Value Ref Range   WBC 6.2 3.8 - 10.8 Thousand/uL   RBC 4.73 3.80 - 5.10 Million/uL   Hemoglobin 12.7 11.7 - 15.5 g/dL   HCT 95.6 21.3 - 08.6 %   MCV 82.9 80.0 - 100.0 fL   MCH 26.8 (L) 27.0 - 33.0 pg   MCHC 32.4 32.0 - 36.0 g/dL    Comment: For adults, a slight decrease in the calculated MCHC value (in the range of 30 to 32 g/dL) is most likely not clinically significant; however, it should be interpreted with caution in correlation with other red cell parameters and the patient's clinical condition.    RDW 13.6 11.0 - 15.0 %   Platelets 220 140 - 400 Thousand/uL   MPV 11.1 7.5 - 12.5 fL   Neutro Abs 4,216 1,500 - 7,800 cells/uL   Absolute Lymphocytes 1,451 850 - 3,900 cells/uL   Absolute Monocytes 384 200 - 950 cells/uL   Eosinophils Absolute 99 15 - 500 cells/uL   Basophils Absolute 50 0 - 200 cells/uL   Neutrophils Relative % 68 %   Total Lymphocyte 23.4 %   Monocytes Relative 6.2 %   Eosinophils Relative 1.6 %   Basophils Relative 0.8 %  Uric acid     Status: None   Collection Time: 04/29/23  9:10 AM  Result Value Ref Range   Uric Acid, Serum 5.4 2.5 - 7.0 mg/dL    Comment: Therapeutic target for gout patients: <6.0 mg/dL .      PHQ2/9:    04/28/2023    8:50 AM 10/27/2022     8:36 AM 09/11/2022    1:19 PM 04/27/2022    2:06 PM 07/24/2021    2:21 PM  Depression screen PHQ 2/9  Decreased Interest 1 2 1 1  0  Down, Depressed, Hopeless 1 2 1 1  0  PHQ - 2 Score 2 4 2 2  0  Altered sleeping 1 0 0 0 0  Tired, decreased energy 1 3 2  0 0  Change in appetite 0 0 0 0 0  Feeling bad or failure about yourself  0 1 1 0 0  Trouble concentrating 0 1 0 0 0  Moving slowly or fidgety/restless 0 0 0 0 0  Suicidal thoughts 0 0 0 0 0  PHQ-9 Score 4 9 5 2  0  Difficult doing work/chores Somewhat difficult Somewhat difficult Somewhat difficult Somewhat difficult Not difficult at all      Fall Risk:    04/28/2023    8:49 AM 10/27/2022    8:36 AM 09/11/2022    1:19 PM 04/27/2022    2:05 PM 07/24/2021    2:20 PM  Fall Risk   Falls in the past year? 0 0 0 0 0  Number falls in past yr: 0 0  0 0  Injury with Fall? 0 0  0 0  Risk for fall due to : No Fall Risks No Fall Risks  No Fall Risks No Fall Risks  Follow up Falls prevention discussed;Education provided;Falls evaluation completed Falls prevention discussed;Education provided;Falls evaluation completed  Falls prevention discussed;Education provided Falls prevention discussed      Functional Status Survey: Is the patient deaf or have difficulty hearing?: No Does the patient have difficulty  seeing, even when wearing glasses/contacts?: No Does the patient have difficulty concentrating, remembering, or making decisions?: No Does the patient have difficulty walking or climbing stairs?: No Does the patient have difficulty dressing or bathing?: No Does the patient have difficulty doing errands alone such as visiting a doctor's office or shopping?: No    Assessment & Plan  Problem List Items Addressed This Visit       Endocrine   PCOS (polycystic ovarian syndrome)    Chronic, ongoing She is currently taking Mounjaro 7.5 compounded weekly injection and reports that she is losing weight Continue with current regimen as well as  exercise and dietary changes      Hypothyroidism    Chronic, historic condition Not currently taking medications Was previously seeing endocrinology for diabetes management along with thyroid monitoring Recheck TSH, T4 today Results to dictate further management Follow-up in 6 months or sooner if concerns arise      Relevant Orders   TSH (Completed)   T4 (Completed)   Type 2 diabetes mellitus without complication, without long-term current use of insulin (HCC) - Primary    Chronic, ongoing Most recent A1c was 6.8 She is currently taking Mounjaro 7.5 mg weekly injection, compounded due to insurance denying coverage She is using a Dexcom for monitoring-sugars typically 104 on average She reports normal dietary intake and is exercising with a personal trainer 3 times a week Recheck A1c today Results to dictate further management Continue current regimen for now Follow-up in 3 months or sooner if concerns arise      Relevant Medications   tirzepatide (MOUNJARO) 7.5 MG/0.5ML Pen   Other Relevant Orders   Hemoglobin A1c (Completed)   HM DIABETES FOOT EXAM (Completed)   Ambulatory referral to Ophthalmology   Microalbumin / creatinine urine ratio (Completed)     Other   Hyperlipidemia    Chronic, historic  Appears well managed with current regimen comprised of atorvastatin 20 mg PO every day  Continue current regimen Recheck lipid panel today. Results to dictate further management  Follow up in 6 months or sooner if concerns arise         Relevant Orders   Lipid Profile (Completed)   Class 3 severe obesity due to excess calories with body mass index (BMI) of 40.0 to 44.9 in adult [facility])    She is taking compounded Mounjaro 7.5 mg weekly injection  She reports good results - she has lost about 30 lbs since her last apt in office  Encouraged her to continue with diet and exercise  Continue Mounjaro weekly injection Follow-up in 3 months or sooner if concerns arise         Relevant Medications   tirzepatide (MOUNJARO) 7.5 MG/0.5ML Pen   Finger pain, left    Acute on chronic, ongoing She reports persistent left index finger pain along the DIP joint that is not improving ROM is intact, strength is intact Will check uric acid along with imaging for rule out Results to dictate further management Can take Tylenol, ibuprofen as needed for pain management Follow-up as needed for progressing or persistent symptoms       Relevant Orders   DG Hand Complete Left   Uric acid   Other Visit Diagnoses     Need for influenza vaccination       Relevant Orders   Flu vaccine trivalent PF, 6mos and older(Flulaval,Afluria,Fluarix,Fluzone) (Completed)        Return in about 3 months (around 07/29/2023) for DM, HLD.  I, Gatsby Chismar E Arizona Nordquist, PA-C, have reviewed all documentation for this visit. The documentation on 04/30/23 for the exam, diagnosis, procedures, and orders are all accurate and complete.   Jacquelin Hawking, MHS, PA-C [facility]

## 2023-04-29 ENCOUNTER — Ambulatory Visit
Admission: RE | Admit: 2023-04-29 | Discharge: 2023-04-29 | Disposition: A | Discharge status: Home / Self Care | Payer: 59 | Source: Ambulatory Visit | Attending: Physician Assistant | Admitting: Physician Assistant

## 2023-04-29 ENCOUNTER — Ambulatory Visit
Admission: RE | Admit: 2023-04-29 | Discharge: 2023-04-29 | Disposition: A | Discharge status: Home / Self Care | Payer: 59 | Attending: Physician Assistant | Admitting: Physician Assistant

## 2023-04-29 DIAGNOSIS — M79645 Pain in left finger(s): Secondary | ICD-10-CM | POA: Diagnosis present

## 2023-04-30 DIAGNOSIS — M79645 Pain in left finger(s): Secondary | ICD-10-CM | POA: Insufficient documentation

## 2023-04-30 LAB — COMPLETE METABOLIC PANEL WITH GFR
AG Ratio: 1.9 (calc) (ref 1.0–2.5)
ALT: 14 U/L (ref 6–29)
AST: 13 U/L (ref 10–35)
Albumin: 4.6 g/dL (ref 3.6–5.1)
Alkaline phosphatase (APISO): 85 U/L (ref 31–125)
BUN: 19 mg/dL (ref 7–25)
CO2: 28 mmol/L (ref 20–32)
Calcium: 9.3 mg/dL (ref 8.6–10.2)
Chloride: 104 mmol/L (ref 98–110)
Creat: 0.92 mg/dL (ref 0.50–0.99)
Globulin: 2.4 g/dL (ref 1.9–3.7)
Glucose, Bld: 87 mg/dL (ref 65–99)
Potassium: 4.2 mmol/L (ref 3.5–5.3)
Sodium: 140 mmol/L (ref 135–146)
Total Bilirubin: 0.5 mg/dL (ref 0.2–1.2)
Total Protein: 7 g/dL (ref 6.1–8.1)
eGFR: 78 mL/min/{1.73_m2} (ref >=60)

## 2023-04-30 LAB — HEMOGLOBIN A1C
Hgb A1c MFr Bld: 5.5 %{Hb} (ref <5.7)
Mean Plasma Glucose: 111 mg/dL
eAG (mmol/L): 6.2 mmol/L

## 2023-04-30 LAB — CBC WITH DIFFERENTIAL/PLATELET
Absolute Lymphocytes: 1451 {cells}/uL (ref 850–3900)
Absolute Monocytes: 384 {cells}/uL (ref 200–950)
Basophils Absolute: 50 {cells}/uL (ref 0–200)
Basophils Relative: 0.8 %
Eosinophils Absolute: 99 {cells}/uL (ref 15–500)
Eosinophils Relative: 1.6 %
HCT: 39.2 % (ref 35.0–45.0)
Hemoglobin: 12.7 g/dL (ref 11.7–15.5)
MCH: 26.8 pg — ABNORMAL LOW (ref 27.0–33.0)
MCHC: 32.4 g/dL (ref 32.0–36.0)
MCV: 82.9 fL (ref 80.0–100.0)
MPV: 11.1 fL (ref 7.5–12.5)
Monocytes Relative: 6.2 %
Neutro Abs: 4216 {cells}/uL (ref 1500–7800)
Neutrophils Relative %: 68 %
Platelets: 220 10*3/uL (ref 140–400)
RBC: 4.73 10*6/uL (ref 3.80–5.10)
RDW: 13.6 % (ref 11.0–15.0)
Total Lymphocyte: 23.4 %
WBC: 6.2 10*3/uL (ref 3.8–10.8)

## 2023-04-30 LAB — LIPID PANEL
Cholesterol: 140 mg/dL (ref <200)
HDL: 39 mg/dL — ABNORMAL LOW (ref >=50)
LDL Cholesterol (Calc): 82 mg/dL
Non-HDL Cholesterol (Calc): 101 mg/dL (ref <130)
Total CHOL/HDL Ratio: 3.6 (calc) (ref <5.0)
Triglycerides: 93 mg/dL (ref <150)

## 2023-04-30 LAB — T4: T4, Total: 8.4 ug/dL (ref 5.1–11.9)

## 2023-04-30 LAB — TSH: TSH: 1.83 m[IU]/L

## 2023-04-30 LAB — MICROALBUMIN / CREATININE URINE RATIO
Creatinine, Urine: 281 mg/dL — ABNORMAL HIGH (ref 20–275)
Microalb Creat Ratio: 2 mg/g{creat} (ref <30)
Microalb, Ur: 0.7 mg/dL

## 2023-04-30 LAB — URIC ACID: Uric Acid, Serum: 5.4 mg/dL (ref 2.5–7.0)

## 2023-04-30 NOTE — Assessment & Plan Note (Signed)
Chronic, historic condition Not currently taking medications Was previously seeing endocrinology for diabetes management along with thyroid monitoring Recheck TSH, T4 today Results to dictate further management Follow-up in 6 months or sooner if concerns arise

## 2023-04-30 NOTE — Assessment & Plan Note (Signed)
Chronic, ongoing Most recent A1c was 6.8 She is currently taking Mounjaro 7.5 mg weekly injection, compounded due to insurance denying coverage She is using a Dexcom for monitoring-sugars typically 104 on average She reports normal dietary intake and is exercising with a personal trainer 3 times a week Recheck A1c today Results to dictate further management Continue current regimen for now Follow-up in 3 months or sooner if concerns arise

## 2023-04-30 NOTE — Progress Notes (Signed)
Your labs are back Your cholesterol looks great Your CBC is overall normal, no signs of anemia Your A1c is 5.5.  Great job Your thyroid testing was normal Your electrolytes, liver and kidney function are in normal limits Your uric acid was in normal ranges.  It is doubtful that your finger pain is caused by gout at this time.  For further rule out a recommend proceeding with imaging if desired

## 2023-04-30 NOTE — Assessment & Plan Note (Signed)
Acute on chronic, ongoing She reports persistent left index finger pain along the DIP joint that is not improving ROM is intact, strength is intact Will check uric acid along with imaging for rule out Results to dictate further management Can take Tylenol, ibuprofen as needed for pain management Follow-up as needed for progressing or persistent symptoms

## 2023-04-30 NOTE — Assessment & Plan Note (Signed)
Chronic, ongoing She is currently taking Mounjaro 7.5 compounded weekly injection and reports that she is losing weight Continue with current regimen as well as exercise and dietary changes

## 2023-05-18 ENCOUNTER — Encounter: Payer: Self-pay | Admitting: Physician Assistant

## 2023-05-18 DIAGNOSIS — E119 Type 2 diabetes mellitus without complications: Secondary | ICD-10-CM

## 2023-05-20 MED ORDER — DEXCOM G7 SENSOR MISC
1.0000 | 6 refills | Status: DC
Start: 2023-05-20 — End: 2023-07-26

## 2023-05-25 NOTE — Progress Notes (Signed)
Your xray results are back for your left hand and finger- there was no evidence of fracture or dislocation at this time

## 2023-05-31 LAB — HM DIABETES EYE EXAM

## 2023-06-18 ENCOUNTER — Encounter: Payer: Self-pay | Admitting: Family Medicine

## 2023-06-18 NOTE — Telephone Encounter (Signed)
 Care team updated and letter sent for eye exam notes.

## 2023-07-19 ENCOUNTER — Other Ambulatory Visit: Payer: Self-pay | Admitting: Family Medicine

## 2023-07-22 ENCOUNTER — Other Ambulatory Visit: Payer: Self-pay | Admitting: Physician Assistant

## 2023-07-22 DIAGNOSIS — E119 Type 2 diabetes mellitus without complications: Secondary | ICD-10-CM

## 2023-07-26 NOTE — Telephone Encounter (Signed)
 Requested medication (s) are due for refill today: yes  Requested medication (s) are on the active medication list: no  Last refill:  05/20/23  Future visit scheduled: yes  Notes to clinic:  Continuous Glucose Sensor (DEXCOM G7 SENSOR) MISC [[id number]]  ENDED not on med list to refill. Please review.      Requested Prescriptions  Pending Prescriptions Disp Refills   Continuous Glucose Sensor (DEXCOM G7 SENSOR) MISC [Pharmacy Med Name: DEXCOM G7 SENSOR] 3 each 2    Sig: 1 Device by Does not apply route continuous for 10 days. Apply device and wear for 10 days continuously for glucose monitoring     Endocrinology: Diabetes - Testing Supplies Passed - 07/26/2023 10:06 AM      Passed - Valid encounter within last 12 months    Recent Outpatient Visits           2 months ago Type 2 diabetes mellitus without complication, without long-term current use of insulin  [facility])   [facility] Mecum, Erin E, PA-C   9 months ago Hyperlipidemia, unspecified hyperlipidemia type   [facility] Leavy Mole, PA-C   1 year ago Hyperlipidemia, unspecified hyperlipidemia type   [facility] Leavy Mole, PA-C   2 years ago Cough, unspecified type   [facility] Gareth Mliss FALCON, FNP   2 years ago Essential hypertension   [facility] Leavy Mole, PA-C       Future Appointments             In 3 days Mecum, Erin E, PA-C [facility]

## 2023-07-29 ENCOUNTER — Encounter: Payer: Self-pay | Admitting: Physician Assistant

## 2023-07-29 ENCOUNTER — Ambulatory Visit (INDEPENDENT_AMBULATORY_CARE_PROVIDER_SITE_OTHER): Payer: 59 | Admitting: Physician Assistant

## 2023-07-29 VITALS — BP 128/70 | HR 72 | Resp 16 | Ht 64.0 in | Wt 233.0 lb

## 2023-07-29 DIAGNOSIS — E039 Hypothyroidism, unspecified: Secondary | ICD-10-CM

## 2023-07-29 DIAGNOSIS — E785 Hyperlipidemia, unspecified: Secondary | ICD-10-CM

## 2023-07-29 DIAGNOSIS — M79645 Pain in left finger(s): Secondary | ICD-10-CM

## 2023-07-29 DIAGNOSIS — Z6839 Body mass index (BMI) 39.0-39.9, adult: Secondary | ICD-10-CM

## 2023-07-29 DIAGNOSIS — K760 Fatty (change of) liver, not elsewhere classified: Secondary | ICD-10-CM | POA: Diagnosis not present

## 2023-07-29 DIAGNOSIS — E8881 Metabolic syndrome: Secondary | ICD-10-CM | POA: Diagnosis not present

## 2023-07-29 DIAGNOSIS — E119 Type 2 diabetes mellitus without complications: Secondary | ICD-10-CM | POA: Diagnosis not present

## 2023-07-29 DIAGNOSIS — F330 Major depressive disorder, recurrent, mild: Secondary | ICD-10-CM | POA: Diagnosis not present

## 2023-07-29 DIAGNOSIS — E66812 Obesity, class 2: Secondary | ICD-10-CM

## 2023-07-29 MED ORDER — VORTIOXETINE HBR 20 MG PO TABS: 20.0000 mg | ORAL_TABLET | Freq: Every day | ORAL | 0 refills | Status: DC

## 2023-07-29 MED ORDER — TIRZEPATIDE 10 MG/0.5ML ~~LOC~~ SOAJ: 10.0000 mg | SUBCUTANEOUS | 0 refills | Status: DC

## 2023-07-29 NOTE — Assessment & Plan Note (Signed)
She is taking compounded Mounjaro 10 mg weekly injection  She reports good results - she has lost about 10 lbs since her last apt in office  Encouraged her to continue with diet and exercise efforts. She is focusing on increasing protein and fiber intake to help with satiety. She is content with current dosing  Continue Mounjaro weekly injection Follow-up in 3 months or sooner if concerns arise

## 2023-07-29 NOTE — Assessment & Plan Note (Addendum)
Chronic, historic condition  Appears to be improving since she has been on Mounjaro  Continue current regimen Recheck CMP at follow up for monitoring Follow up in 3 months or sooner if concerns arise

## 2023-07-29 NOTE — Assessment & Plan Note (Addendum)
Chronic, historic condition  Appears well managed at this time with Atorvastatin 20 mg PO every day  Continue current regimen Recheck lipid panel at follow up in 3 months for monitoring

## 2023-07-29 NOTE — Progress Notes (Signed)
Established Patient Office Visit  Name: Joanna Scott   MRN: [id number]    DOB: [birth year]   Date:07/29/2023  Today's Provider: Jacquelin Hawking, MHS, PA-C Introduced myself to the patient as a PA-C and provided education on APPs in clinical practice.         Subjective  Chief Complaint  Chief Complaint  Patient presents with   Medical Management of Chronic Issues    3 months    HPI  Diabetes, Type 2 - Last A1c 5.5% - Medications: Mounjaro 10 mg weekly injection  - Compliance: Good  - Checking BG at home: She uses Dexcom CGM without issues. She reports getting lows at night but is concerned that these may be from her position. She double checks with finger prick testing and it is usually okay   - Exercise: She is exercising with personal trainer and walking several times per week  - Eye exam: Reviewed importance of annual eye exam for monitoring  - Foot exam: UTD - Microalbumin: UTD  - Statin: on statin  - PNA vaccine: NA  - Denies symptoms of hypoglycemia, polyuria, polydipsia, numbness extremities, foot ulcers/trauma  HYPERLIPIDEMIA Hyperlipidemia status: good compliance Satisfied with current treatment?  yes Side effects:  no Medication compliance: good compliance Past cholesterol meds: Atorvastatin 20 mg PO every day  Supplements: none Aspirin:  yes The 10-year ASCVD risk score (Arnett DK, et al., 2019) is: 1.7%   Values used to calculate the score:     Age: 48 years     Sex: Female     Is Non-Hispanic African American: No     Diabetic: Yes     Tobacco smoker: No     Systolic Blood Pressure: 128 mmHg     Is BP treated: No     HDL Cholesterol: 39 mg/dL     Total Cholesterol: 140 mg/dL Chest pain:  no    Patient Active Problem List   Diagnosis Date Noted   Finger pain, left 04/30/2023   Fatty liver disease, nonalcoholic 12/24/2020   Atherosclerosis 12/24/2020   OSA on CPAP 09/23/2020   Type 2 diabetes mellitus without complication, without long-term  current use of insulin (HCC) 09/23/2020   Lynch syndrome 06/12/2020   Allergy with anaphylaxis due to food 06/12/2020   Metabolic syndrome 06/12/2020   Iron deficiency anemia 12/14/2019   Hypothyroidism 10/04/2019   Seborrheic dermatitis of scalp 10/04/2019   Class 2 severe obesity with serious comorbidity and body mass index (BMI) of 39.0 to 39.9 in adult (HCC) 10/04/2019   Prothrombin gene mutation (HCC) 10/04/2019   S/P total hysterectomy and bilateral salpingo-oophorectomy 06/26/2019   Hyperlipidemia 04/10/2019   Major depressive disorder with current active episode 04/10/2019   Allergic rhinitis 04/10/2019   PCOS (polycystic ovarian syndrome) 10/20/2012    Past Surgical History:  Procedure Laterality Date   CESAREAN SECTION     CESAREAN SECTION N/A 12/17/2014   Procedure: CESAREAN SECTION;  Surgeon: Conard Novak, MD;  Location: [facility];  Service: Obstetrics;  Laterality: N/A;   CYSTOSCOPY  06/26/2019   Procedure: CYSTOSCOPY;  Surgeon: Ward, Elenora Fender, MD;  Location: [facility];  Service: Gynecology;;   CYSTOSCOPY WITH STENT PLACEMENT Right 06/26/2019   Procedure: CYSTOSCOPY WITH STENT PLACEMENT, Cystotomy;  Surgeon: Ward, Elenora Fender, MD;  Location: [facility];  Service: Gynecology;  Laterality: Right;   DIAGNOSTIC LAPAROSCOPY     DILATION AND CURETTAGE OF UTERUS     LAPAROSCOPIC CHOLECYSTECTOMY  Lynch syndrome     ROBOTIC ASSISTED TOTAL HYSTERECTOMY WITH BILATERAL SALPINGO OOPHERECTOMY Bilateral 06/26/2019   Procedure: XI ROBOTIC ASSISTED TOTAL HYSTERECTOMY WITH BILATERAL SALPINGO OOPHORECTOMY;  Surgeon: Ward, Elenora Fender, MD;  Location: [facility];  Service: Gynecology;  Laterality: Bilateral;    Family History  Problem Relation Age of Onset   Hypertension Mother    Depression Mother    COPD Mother    Ovarian cancer Sister    Deep vein thrombosis Maternal Grandmother    Stroke Maternal Grandfather    Heart attack Maternal Grandfather    Malignant hyperthermia Cousin     Encopresis Son    Malignant hyperthermia Other    Breast cancer Neg Hx     Social History   Tobacco Use   Smoking status: Former    Current packs/day: 1.00    Average packs/day: 1 pack/day for 5.0 years (5.0 ttl pk-yrs)    Types: Cigarettes   Smokeless tobacco: Never   Tobacco comments:    pt stopped at age 72  Substance Use Topics   Alcohol use: Yes    Comment: 2 times a year for special occasions     Current Outpatient Medications:    aspirin EC 81 MG tablet, Take 81 mg by mouth daily., Disp: , Rfl:    atorvastatin (LIPITOR) 20 MG tablet, TAKE 1 TABLET BY MOUTH EVERYDAY AT BEDTIME, Disp: 90 tablet, Rfl: 3   cetirizine (ZYRTEC) 10 MG tablet, Take 1 tablet (10 mg total) by mouth daily as needed for allergies., Disp: , Rfl:    Continuous Glucose Sensor (DEXCOM G7 SENSOR) MISC, 1 DEVICE BY DOES NOT APPLY ROUTE CONTINUOUS FOR 10 DAYS. APPLY DEVICE AND WEAR FOR 10 DAYS CONTINUOUSLY FOR GLUCOSE MONITORING, Disp: 3 each, Rfl: 2   CONTOUR NEXT TEST test strip, 3 (three) times daily., Disp: , Rfl:    EPINEPHrine 0.3 mg/0.3 mL IJ SOAJ injection, Inject 0.3 mg into the muscle as needed for anaphylaxis., Disp: 2 each, Rfl: 0   hydrocortisone 2.5 % cream, Apply 1 Application topically as needed., Disp: , Rfl:    ketoconazole (NIZORAL) 2 % shampoo, APPLY 1 APPLICATION TOPICALLY 2 (TWO) TIMES A WEEK., Disp: 120 mL, Rfl: 2   pantoprazole (PROTONIX) 40 MG tablet, TAKE 1 TABLET BY MOUTH EVERY DAY IN THE MORNING, Disp: 90 tablet, Rfl: 3   tirzepatide (MOUNJARO) 10 MG/0.5ML Pen, Inject 10 mg into the skin once a week., Disp: 6 mL, Rfl: 0   vortioxetine HBr (TRINTELLIX) 20 MG TABS tablet, Take 1 tablet (20 mg total) by mouth daily., Disp: 60 tablet, Rfl: 0  Allergies  Allergen Reactions   Peach [Prunus Persica] Anaphylaxis and Nausea And Vomiting   Metformin And Related Other (See Comments)    Severe GI SE and intolerance   Vitex Itching    Sneezing and itchy eyes  Charletta Cousin and [place]    I  personally reviewed active problem list, medication list, allergies, notes from last encounter, lab results with the patient/caregiver today.   Review of Systems  Eyes:  Negative for blurred vision and double vision.  Respiratory:  Negative for shortness of breath and wheezing.   Cardiovascular:  Negative for chest pain and palpitations.  Musculoskeletal:  Positive for myalgias (left index finger pain).  Neurological:  Negative for dizziness and headaches.  Psychiatric/Behavioral:  Negative for depression. The patient is not nervous/anxious and does not have insomnia.       Objective  Vitals:   07/29/23 0934  BP: 128/70  Pulse:  72  Resp: 16  SpO2: 95%  Weight: 233 lb (105.7 kg)  Height: 5\' 4"  (1.626 m)    Body mass index is 39.99 kg/m.  Physical Exam Vitals reviewed.  Constitutional:      General: She is awake.     Appearance: Normal appearance. She is well-developed and well-groomed.  HENT:     Head: Normocephalic and atraumatic.  Cardiovascular:     Rate and Rhythm: Normal rate and regular rhythm.     Pulses: Normal pulses.          Radial pulses are 2+ on the right side and 2+ on the left side.     Heart sounds: Normal heart sounds. No murmur heard.    No friction rub. No gallop.  Pulmonary:     Effort: Pulmonary effort is normal.     Breath sounds: Normal breath sounds. No decreased air movement. No decreased breath sounds, wheezing, rhonchi or rales.  Musculoskeletal:     Cervical back: Normal range of motion.     Right lower leg: No edema.     Left lower leg: No edema.  Neurological:     General: No focal deficit present.     Mental Status: She is alert and oriented to person, place, and time. Mental status is at baseline.     GCS: GCS eye subscore is 4. GCS verbal subscore is 5. GCS motor subscore is 6.  Psychiatric:        Attention and Perception: Attention and perception normal.        Mood and Affect: Mood and affect normal.        Speech: Speech  normal.        Behavior: Behavior normal. Behavior is cooperative.        Thought Content: Thought content normal.        Cognition and Memory: Cognition normal.      No results found for this or any previous visit (from the past 2160 hours).   PHQ2/9:    07/29/2023    9:32 AM 04/28/2023    8:50 AM 10/27/2022    8:36 AM 09/11/2022    1:19 PM 04/27/2022    2:06 PM  Depression screen PHQ 2/9  Decreased Interest 0 1 2 1 1   Down, Depressed, Hopeless 0 1 2 1 1   PHQ - 2 Score 0 2 4 2 2   Altered sleeping 0 1 0 0 0  Tired, decreased energy 0 1 3 2  0  Change in appetite 0 0 0 0 0  Feeling bad or failure about yourself  0 0 1 1 0  Trouble concentrating 0 0 1 0 0  Moving slowly or fidgety/restless 0 0 0 0 0  Suicidal thoughts 0 0 0 0 0  PHQ-9 Score 0 4 9 5 2   Difficult doing work/chores  Somewhat difficult Somewhat difficult Somewhat difficult Somewhat difficult      Fall Risk:    04/28/2023    8:49 AM 10/27/2022    8:36 AM 09/11/2022    1:19 PM 04/27/2022    2:05 PM 07/24/2021    2:20 PM  Fall Risk   Falls in the past year? 0 0 0 0 0  Number falls in past yr: 0 0  0 0  Injury with Fall? 0 0  0 0  Risk for fall due to : No Fall Risks No Fall Risks  No Fall Risks No Fall Risks  Follow up Falls prevention discussed;Education provided;Falls evaluation completed Falls  prevention discussed;Education provided;Falls evaluation completed  Falls prevention discussed;Education provided Falls prevention discussed      Functional Status Survey:      Assessment & Plan  Problem List Items Addressed This Visit       Digestive   Fatty liver disease, nonalcoholic   Chronic, historic condition  Appears to be improving since she has been on Mounjaro  Continue current regimen Recheck CMP at follow up for monitoring Follow up in 3 months or sooner if concerns arise         Relevant Medications   tirzepatide (MOUNJARO) 10 MG/0.5ML Pen     Endocrine   Type 2 diabetes mellitus  without complication, without long-term current use of insulin (HCC) - Primary   Chronic, ongoing Most recent A1c was 5.5% She is currently taking Mounjaro 10 mg weekly injection, compounded due to insurance denying coverage. She is willing to try regular formulation. Will send in script to see if insurance will cover  She is using a Dexcom for monitoring She reports normal dietary intake and is exercising with a personal trainer 3 times a week Continue current regimen for now Follow-up in 3 months or sooner if concerns arise      Relevant Medications   tirzepatide (MOUNJARO) 10 MG/0.5ML Pen     Other   Hyperlipidemia   Chronic, historic condition  Appears well managed at this time with Atorvastatin 20 mg PO every day  Continue current regimen Recheck lipid panel at follow up in 3 months for monitoring       Major depressive disorder with current active episode   She reports she is having good results with Trintellix at current dose Reviewed PHQ9 results today -score of 0 Continue current regimen Follow up in 3 months or sooner if concerns arise        Relevant Medications   vortioxetine HBr (TRINTELLIX) 20 MG TABS tablet   Class 2 severe obesity with serious comorbidity and body mass index (BMI) of 39.0 to 39.9 in adult [facility])   She is taking compounded Mounjaro 10 mg weekly injection  She reports good results - she has lost about 10 lbs since her last apt in office  Encouraged her to continue with diet and exercise efforts. She is focusing on increasing protein and fiber intake to help with satiety. She is content with current dosing  Continue Mounjaro weekly injection Follow-up in 3 months or sooner if concerns arise        Relevant Medications   tirzepatide (MOUNJARO) 10 MG/0.5ML Pen   Metabolic syndrome   Relevant Medications   tirzepatide (MOUNJARO) 10 MG/0.5ML Pen   Finger pain, left   chronic, ongoing She reports persistent left index finger pain along the DIP  joint that is not improving ROM is intact, strength is intact Not improving with previous recommendations of Tylenol and Ibuprofen  Uric acid was normal at previous visit Will refer to Hand speciality for evaluation and potential management Follow-up as needed for progressing or persistent symptoms       Relevant Orders   Ambulatory referral to Hand Surgery     Return in about 3 months (around 10/27/2023) for Depression, DM, HLD.   I, Ilayda Toda E Cherie Lasalle, PA-C, have reviewed all documentation for this visit. The documentation on 07/30/23 for the exam, diagnosis, procedures, and orders are all accurate and complete.   Jacquelin Hawking, MHS, PA-C [facility]

## 2023-07-29 NOTE — Assessment & Plan Note (Addendum)
She reports she is having good results with Trintellix at current dose Reviewed PHQ9 results today -score of 0 Continue current regimen Follow up in 3 months or sooner if concerns arise

## 2023-07-30 NOTE — Assessment & Plan Note (Signed)
Chronic, ongoing Most recent A1c was 5.5% She is currently taking Mounjaro 10 mg weekly injection, compounded due to insurance denying coverage. She is willing to try regular formulation. Will send in script to see if insurance will cover  She is using a Dexcom for monitoring She reports normal dietary intake and is exercising with a personal trainer 3 times a week Continue current regimen for now Follow-up in 3 months or sooner if concerns arise

## 2023-07-30 NOTE — Assessment & Plan Note (Signed)
chronic, ongoing She reports persistent left index finger pain along the DIP joint that is not improving ROM is intact, strength is intact Not improving with previous recommendations of Tylenol and Ibuprofen  Uric acid was normal at previous visit Will refer to Hand speciality for evaluation and potential management Follow-up as needed for progressing or persistent symptoms

## 2023-09-12 ENCOUNTER — Encounter: Payer: Self-pay | Admitting: Physician Assistant

## 2023-09-27 ENCOUNTER — Ambulatory Visit
Admission: EM | Admit: 2023-09-27 | Discharge: 2023-09-27 | Disposition: A | Discharge status: Home / Self Care | Attending: Emergency Medicine | Admitting: Emergency Medicine

## 2023-09-27 DIAGNOSIS — H6692 Otitis media, unspecified, left ear: Secondary | ICD-10-CM | POA: Diagnosis not present

## 2023-09-27 MED ORDER — AMOXICILLIN 875 MG PO TABS: 875.0000 mg | ORAL_TABLET | Freq: Two times a day (BID) | ORAL | 0 refills | Status: AC

## 2023-09-27 MED ORDER — FLUCONAZOLE 150 MG PO TABS: 150.0000 mg | ORAL_TABLET | Freq: Once | ORAL | 0 refills | Status: AC

## 2023-09-27 NOTE — Discharge Instructions (Addendum)
 Take the amoxicillin as directed.  Follow up with your primary care provider if your symptoms are not improving.

## 2023-09-27 NOTE — ED Triage Notes (Signed)
 Patient to Urgent Care with complaints of left sided ear pain/ headaches. Reports recurrent left sided ear issues.  Symptoms started three days ago. Denies any known fevers.

## 2023-09-27 NOTE — ED Provider Notes (Signed)
 Renaldo Fiddler    CSN: [id number] Arrival date & time: 09/27/23  0906      History   Chief Complaint Chief Complaint  Patient presents with   Otalgia    HPI Joanna Scott is a 48 y.o. female.  Patient presents with left ear pain and mild headache x 3 days.  No fever, ear drainage, sore throat, cough, shortness of breath.  Patient reports remote history of ruptured left TM with recurrent ear infections since.  Last occurred years ago.  The history is provided by the patient and medical records.    Past Medical History:  Diagnosis Date   Anemia    Anemia 04/10/2019   Anxiety    Bilateral polycystic ovarian syndrome 10/20/2012   Diabetes mellitus without complication (HCC)    Family history of adverse reaction to anesthesia    malignant hyperthermia in 2 first cousins    Family history of malignant hyperthermia    GERD (gastroesophageal reflux disease)    Hypothyroidism    Microcytosis 10/04/2019   Obesity affecting pregnancy    PCOS (polycystic ovarian syndrome)    Portal vein thrombosis    Postpartum care following cesarean delivery 12/19/2014   Preeclampsia in postpartum period    Pregnancy    Previous cesarean delivery, delivered 12/16/2014   Previous cesarean section    Prothrombin gene mutation (HCC)    S/P cesarean section 12/17/2014   Scalp psoriasis    Sleep apnea     Patient Active Problem List   Diagnosis Date Noted   Finger pain, left 04/30/2023   Fatty liver disease, nonalcoholic 12/24/2020   Atherosclerosis 12/24/2020   OSA on CPAP 09/23/2020   Type 2 diabetes mellitus without complication, without long-term current use of insulin (HCC) 09/23/2020   Lynch syndrome 06/12/2020   Allergy with anaphylaxis due to food 06/12/2020   Metabolic syndrome 06/12/2020   Iron deficiency anemia 12/14/2019   Hypothyroidism 10/04/2019   Seborrheic dermatitis of scalp 10/04/2019   Class 2 severe obesity with serious comorbidity and body mass index (BMI) of  39.0 to 39.9 in adult (HCC) 10/04/2019   Prothrombin gene mutation (HCC) 10/04/2019   S/P total hysterectomy and bilateral salpingo-oophorectomy 06/26/2019   Hyperlipidemia 04/10/2019   Major depressive disorder with current active episode 04/10/2019   Allergic rhinitis 04/10/2019   PCOS (polycystic ovarian syndrome) 10/20/2012    Past Surgical History:  Procedure Laterality Date   CESAREAN SECTION     CESAREAN SECTION N/A 12/17/2014   Procedure: CESAREAN SECTION;  Surgeon: Conard Novak, MD;  Location: [facility] ORS;  Service: Obstetrics;  Laterality: N/A;   CYSTOSCOPY  06/26/2019   Procedure: CYSTOSCOPY;  Surgeon: Ward, Elenora Fender, MD;  Location: [facility];  Service: Gynecology;;   CYSTOSCOPY WITH STENT PLACEMENT Right 06/26/2019   Procedure: CYSTOSCOPY WITH STENT PLACEMENT, Cystotomy;  Surgeon: Ward, Elenora Fender, MD;  Location: [facility];  Service: Gynecology;  Laterality: Right;   DIAGNOSTIC LAPAROSCOPY     DILATION AND CURETTAGE OF UTERUS     LAPAROSCOPIC CHOLECYSTECTOMY     Lynch syndrome     ROBOTIC ASSISTED TOTAL HYSTERECTOMY WITH BILATERAL SALPINGO OOPHERECTOMY Bilateral 06/26/2019   Procedure: XI ROBOTIC ASSISTED TOTAL HYSTERECTOMY WITH BILATERAL SALPINGO OOPHORECTOMY;  Surgeon: Ward, Elenora Fender, MD;  Location: [facility];  Service: Gynecology;  Laterality: Bilateral;    OB History     Gravida  5   Para  2   Term  2   Preterm  0   AB  3  Living  2      SAB  3   IAB  0   Ectopic  0   Multiple  0   Live Births  2            Home Medications    Prior to Admission medications   Medication Sig Start Date End Date Taking? Authorizing Provider  amoxicillin (AMOXIL) 875 MG tablet Take 1 tablet (875 mg total) by mouth 2 (two) times daily for 10 days. 09/27/23 10/07/23 Yes Mickie Bail, NP  fluconazole (DIFLUCAN) 150 MG tablet Take 1 tablet (150 mg total) by mouth once for 1 dose. 09/27/23 09/27/23 Yes Mickie Bail, NP  aspirin EC 81 MG tablet Take 81 mg by mouth  daily.    [provider]  atorvastatin (LIPITOR) 20 MG tablet TAKE 1 TABLET BY MOUTH EVERYDAY AT BEDTIME 03/13/23   Danelle Berry, PA-C  cetirizine (ZYRTEC) 10 MG tablet Take 1 tablet (10 mg total) by mouth daily as needed for allergies. 04/27/22   Danelle Berry, PA-C  CONTOUR NEXT TEST test strip 3 (three) times daily. 06/25/22   [provider]  EPINEPHrine 0.3 mg/0.3 mL IJ SOAJ injection Inject 0.3 mg into the muscle as needed for anaphylaxis. 06/22/22   Danelle Berry, PA-C  hydrocortisone 2.5 % cream Apply 1 Application topically as needed. 08/23/22   [provider]  ketoconazole (NIZORAL) 2 % shampoo APPLY 1 APPLICATION TOPICALLY 2 (TWO) TIMES A WEEK. 04/24/21   Danelle Berry, PA-C  pantoprazole (PROTONIX) 40 MG tablet TAKE 1 TABLET BY MOUTH EVERY DAY IN THE MORNING 10/28/22   Danelle Berry, PA-C  tirzepatide [facility]) 10 MG/0.5ML Pen Inject 10 mg into the skin once a week. 07/29/23   Mecum, Erin E, PA-C  vortioxetine HBr (TRINTELLIX) 20 MG TABS tablet Take 1 tablet (20 mg total) by mouth daily. 07/29/23   Mecum, Oswaldo Conroy, PA-C    Family History Family History  Problem Relation Age of Onset   Hypertension Mother    Depression Mother    COPD Mother    Ovarian cancer Sister    Deep vein thrombosis Maternal Grandmother    Stroke Maternal Grandfather    Heart attack Maternal Grandfather    Malignant hyperthermia Cousin    Encopresis Son    Malignant hyperthermia Other    Breast cancer Neg Hx     Social History Social History   Tobacco Use   Smoking status: Former    Current packs/day: 1.00    Average packs/day: 1 pack/day for 5.0 years (5.0 ttl pk-yrs)    Types: Cigarettes   Smokeless tobacco: Never   Tobacco comments:    pt stopped at age 61  Vaping Use   Vaping status: Never Used  Substance Use Topics   Alcohol use: Yes    Comment: 2 times a year for special occasions   Drug use: No     Allergies   Peach [prunus persica], Metformin and related,  and Vitex   Review of Systems Review of Systems  Constitutional:  Negative for chills and fever.  HENT:  Positive for ear pain. Negative for ear discharge and sore throat.   Respiratory:  Negative for cough and shortness of breath.   Neurological:  Positive for headaches.     Physical Exam Triage Vital Signs ED Triage Vitals  Encounter Vitals Group     BP 09/27/23 0945 106/74     Systolic BP Percentile --      Diastolic BP Percentile --  Pulse Rate 09/27/23 0945 85     Resp 09/27/23 0945 18     Temp 09/27/23 0945 98.4 F (36.9 C)     Temp src --      SpO2 09/27/23 0945 96 %     Weight --      Height --      Head Circumference --      Peak Flow --      Pain Score 09/27/23 0933 3     Pain Loc --      Pain Education --      Exclude from Growth Chart --    No data found.  Updated Vital Signs BP 106/74   Pulse 85   Temp 98.4 F (36.9 C)   Resp 18   LMP 05/23/2019 (Approximate)   SpO2 96%   Visual Acuity Right Eye Distance:   Left Eye Distance:   Bilateral Distance:    Right Eye Near:   Left Eye Near:    Bilateral Near:     Physical Exam Constitutional:      General: She is not in acute distress. HENT:     Right Ear: Tympanic membrane normal.     Left Ear: Tympanic membrane is erythematous.     Nose: Nose normal.     Mouth/Throat:     Mouth: Mucous membranes are moist.     Pharynx: Oropharynx is clear.  Cardiovascular:     Rate and Rhythm: Normal rate and regular rhythm.     Heart sounds: Normal heart sounds.  Pulmonary:     Effort: Pulmonary effort is normal. No respiratory distress.     Breath sounds: Normal breath sounds.  Neurological:     Mental Status: She is alert.      UC Treatments / Results  Labs (all labs ordered are listed, but only abnormal results are displayed) Labs Reviewed - No data to display  EKG   Radiology No results found.  Procedures Procedures (including critical care time)  Medications Ordered in  UC Medications - No data to display  Initial Impression / Assessment and Plan / UC Course  I have reviewed the triage vital signs and the nursing notes.  Pertinent labs & imaging results that were available during my care of the patient were reviewed by me and considered in my medical decision making (see chart for details).    Left otitis media.  Treating with with amoxicillin.  Tylenol or ibuprofen as needed.  Education provided on otitis media.  Instructed patient to follow up with her PCP if her symptoms are not improving.  She agrees to plan of care.   Final Clinical Impressions(s) / UC Diagnoses   Final diagnoses:  Left otitis media, unspecified otitis media type     Discharge Instructions      Take the amoxicillin as directed.  Follow-up with your primary care provider if your symptoms are not improving.      ED Prescriptions     Medication Sig Dispense Auth. Provider   amoxicillin (AMOXIL) 875 MG tablet Take 1 tablet (875 mg total) by mouth 2 (two) times daily for 10 days. 20 tablet Mickie Bail, NP   fluconazole (DIFLUCAN) 150 MG tablet Take 1 tablet (150 mg total) by mouth once for 1 dose. 1 tablet Mickie Bail, NP      PDMP not reviewed this encounter.   Mickie Bail, NP 09/27/23 1003

## 2023-10-13 ENCOUNTER — Ambulatory Visit (INDEPENDENT_AMBULATORY_CARE_PROVIDER_SITE_OTHER): Admitting: Family Medicine

## 2023-10-13 ENCOUNTER — Encounter: Payer: Self-pay | Admitting: Family Medicine

## 2023-10-13 VITALS — BP 120/72 | HR 81 | Resp 16 | Ht 64.0 in | Wt 232.0 lb

## 2023-10-13 DIAGNOSIS — H9202 Otalgia, left ear: Secondary | ICD-10-CM | POA: Diagnosis not present

## 2023-10-13 DIAGNOSIS — Z90710 Acquired absence of both cervix and uterus: Secondary | ICD-10-CM | POA: Diagnosis not present

## 2023-10-13 DIAGNOSIS — N760 Acute vaginitis: Secondary | ICD-10-CM

## 2023-10-13 DIAGNOSIS — D6852 Prothrombin gene mutation: Secondary | ICD-10-CM | POA: Diagnosis not present

## 2023-10-13 DIAGNOSIS — Z1509 Genetic susceptibility to other malignant neoplasm: Secondary | ICD-10-CM | POA: Diagnosis not present

## 2023-10-13 DIAGNOSIS — Z9079 Acquired absence of other genital organ(s): Secondary | ICD-10-CM

## 2023-10-13 DIAGNOSIS — Z90722 Acquired absence of ovaries, bilateral: Secondary | ICD-10-CM

## 2023-10-13 DIAGNOSIS — N952 Postmenopausal atrophic vaginitis: Secondary | ICD-10-CM

## 2023-10-13 DIAGNOSIS — E8941 Symptomatic postprocedural ovarian failure: Secondary | ICD-10-CM

## 2023-10-13 MED ORDER — ESTRADIOL 10 MCG VA TABS: 1.0000 | ORAL_TABLET | VAGINAL | 11 refills | Status: AC

## 2023-10-13 MED ORDER — NEOMYCIN-POLYMYXIN-HC 3.5-10000-1 OT SOLN: 4.0000 [drp] | Freq: Four times a day (QID) | OTIC | 0 refills | Status: AC

## 2023-10-13 NOTE — Progress Notes (Signed)
 Patient ID: Joanna Scott, female    DOB: 1975/10/11, 48 y.o.   MRN: [id number]  PCP: Danelle Berry, PA-C  Chief Complaint  Patient presents with   Ear Pain    Left, x2 weeks. Antibiotics didn't help.    Subjective:   [name] is a 48 y.o. female, presents to clinic with CC of the following:  HPI  Left ear pain, she did go to UC 2 weeks ago and was dx with AOM and tx with amoxicillin She still has pain in ear and around ear and some tenderness in front of and behind ear Possibly some allergies No fever  PT utd on mammogram  S/p TAH-BSO for lynch syndrome, early surgical menopause not on HRT, she is symptomatic including vaginal dryness/irritation and recurrent infections and hot flashes, sweats Discussed HRT, though this is complicated by her hx of prothrombin gene mutation and prior blood clot when on OCP in her 20's.  She states for long travel or surgeries she has been covered with blood thinners to avoid any other VTE's  Hx of PCOS, metabolic syndrome, obesity and hypothyroid she has worked very hard for years on diet and lifestyle efforts    Patient Active Problem List   Diagnosis Date Noted   Finger pain, left 04/30/2023   Fatty liver disease, nonalcoholic 12/24/2020   Atherosclerosis 12/24/2020   OSA on CPAP 09/23/2020   Type 2 diabetes mellitus without complication, without long-term current use of insulin (HCC) 09/23/2020   Lynch syndrome 06/12/2020   Allergy with anaphylaxis due to food 06/12/2020   Metabolic syndrome 06/12/2020   Iron deficiency anemia 12/14/2019   Hypothyroidism 10/04/2019   Seborrheic dermatitis of scalp 10/04/2019   Class 2 severe obesity with serious comorbidity and body mass index (BMI) of 39.0 to 39.9 in adult (HCC) 10/04/2019   Prothrombin gene mutation (HCC) 10/04/2019   S/P total hysterectomy and bilateral salpingo-oophorectomy 06/26/2019   Hyperlipidemia 04/10/2019   Major depressive disorder with current active episode  04/10/2019   Allergic rhinitis 04/10/2019   PCOS (polycystic ovarian syndrome) 10/20/2012      Current Outpatient Medications:    aspirin EC 81 MG tablet, Take 81 mg by mouth daily., Disp: , Rfl:    atorvastatin (LIPITOR) 20 MG tablet, TAKE 1 TABLET BY MOUTH EVERYDAY AT BEDTIME, Disp: 90 tablet, Rfl: 3   cetirizine (ZYRTEC) 10 MG tablet, Take 1 tablet (10 mg total) by mouth daily as needed for allergies., Disp: , Rfl:    CONTOUR NEXT TEST test strip, 3 (three) times daily., Disp: , Rfl:    EPINEPHrine 0.3 mg/0.3 mL IJ SOAJ injection, Inject 0.3 mg into the muscle as needed for anaphylaxis., Disp: 2 each, Rfl: 0   hydrocortisone 2.5 % cream, Apply 1 Application topically as needed., Disp: , Rfl:    ketoconazole (NIZORAL) 2 % shampoo, APPLY 1 APPLICATION TOPICALLY 2 (TWO) TIMES A WEEK., Disp: 120 mL, Rfl: 2   neomycin-polymyxin-hydrocortisone (CORTISPORIN) OTIC solution, Place 4 drops into both ears 4 (four) times daily for 7 days., Disp: 10 mL, Rfl: 0   pantoprazole (PROTONIX) 40 MG tablet, TAKE 1 TABLET BY MOUTH EVERY DAY IN THE MORNING, Disp: 90 tablet, Rfl: 3   tirzepatide (MOUNJARO) 10 MG/0.5ML Pen, Inject 10 mg into the skin once a week., Disp: 6 mL, Rfl: 0   vortioxetine HBr (TRINTELLIX) 20 MG TABS tablet, Take 1 tablet (20 mg total) by mouth daily., Disp: 60 tablet, Rfl: 0   Allergies  Allergen Reactions  Peach [Prunus Persica] Anaphylaxis and Nausea And Vomiting   Metformin And Related Other (See Comments)    Severe GI SE and intolerance   Vitex Itching    Sneezing and itchy eyes  Charletta Cousin and [place]     Social History   Tobacco Use   Smoking status: Former    Current packs/day: 1.00    Average packs/day: 1 pack/day for 5.0 years (5.0 ttl pk-yrs)    Types: Cigarettes   Smokeless tobacco: Never   Tobacco comments:    pt stopped at age 74  Vaping Use   Vaping status: Never Used  Substance Use Topics   Alcohol use: Yes    Comment: 2 times a year for special occasions    Drug use: No      Chart Review Today: I personally reviewed active problem list, medication list, allergies, family history, social history, health maintenance, notes from last encounter, lab results, imaging with the patient/caregiver today.   Review of Systems  Constitutional: Negative.   HENT: Negative.    Eyes: Negative.   Respiratory: Negative.    Cardiovascular: Negative.   Gastrointestinal: Negative.   Endocrine: Negative.   Genitourinary: Negative.   Musculoskeletal: Negative.   Skin: Negative.   Allergic/Immunologic: Negative.   Neurological: Negative.   Hematological: Negative.   Psychiatric/Behavioral: Negative.    All other systems reviewed and are negative.      Objective:   Vitals:   10/13/23 0827  BP: 120/72  Pulse: 81  Resp: 16  SpO2: 97%  Weight: 232 lb (105.2 kg)  Height: 5\' 4"  (1.626 m)    Body mass index is 39.82 kg/m.  Physical Exam Vitals and nursing note reviewed.  Constitutional:      General: She is not in acute distress.    Appearance: She is well-developed. She is obese. She is not ill-appearing, toxic-appearing or diaphoretic.  HENT:     Head: Normocephalic and atraumatic.     Right Ear: Tympanic membrane, ear canal and external ear normal. There is no impacted cerumen.     Left Ear: Tympanic membrane normal. Swelling and tenderness present. No drainage.  No middle ear effusion. There is no impacted cerumen. No mastoid tenderness.     Nose: Congestion present.     Mouth/Throat:     Mouth: Mucous membranes are moist.     Pharynx: Oropharynx is clear. No oropharyngeal exudate or posterior oropharyngeal erythema.  Eyes:     General: No scleral icterus.       Right eye: No discharge.        Left eye: No discharge.     Conjunctiva/sclera: Conjunctivae normal.  Neck:     Trachea: No tracheal deviation.  Cardiovascular:     Rate and Rhythm: Normal rate and regular rhythm.  Pulmonary:     Effort: Pulmonary effort is normal. No  respiratory distress.     Breath sounds: No stridor.  Musculoskeletal:        General: Normal range of motion.  Lymphadenopathy:     Head:     Left side of head: Preauricular and posterior auricular adenopathy present.  Skin:    General: Skin is warm and dry.     Findings: No rash.  Neurological:     Mental Status: She is alert.     Motor: No abnormal muscle tone.     Coordination: Coordination normal.  Psychiatric:        Behavior: Behavior normal. Behavior is cooperative.      Results  for orders placed or performed in visit on 04/28/23  Hemoglobin A1c   Collection Time: 04/29/23  9:10 AM  Result Value Ref Range   Hgb A1c MFr Bld 5.5 <5.7 % of total Hgb   Mean Plasma Glucose 111 mg/dL   eAG (mmol/L) 6.2 mmol/L  Microalbumin / creatinine urine ratio   Collection Time: 04/29/23  9:10 AM  Result Value Ref Range   Creatinine, Urine 281 (H) 20 - 275 mg/dL   Microalb, Ur 0.7 mg/dL   Microalb Creat Ratio 2 <30 mg/g creat  TSH   Collection Time: 04/29/23  9:10 AM  Result Value Ref Range   TSH 1.83 mIU/L  T4   Collection Time: 04/29/23  9:10 AM  Result Value Ref Range   T4, Total 8.4 5.1 - 11.9 mcg/dL  Lipid Profile   Collection Time: 04/29/23  9:10 AM  Result Value Ref Range   Cholesterol 140 <200 mg/dL   HDL 39 (L) > OR = 50 mg/dL   Triglycerides 93 <259 mg/dL   LDL Cholesterol (Calc) 82 mg/dL (calc)   Total CHOL/HDL Ratio 3.6 <5.0 (calc)   Non-HDL Cholesterol (Calc) 101 <130 mg/dL (calc)  COMPLETE METABOLIC PANEL WITH GFR   Collection Time: 04/29/23  9:10 AM  Result Value Ref Range   Glucose, Bld 87 65 - 99 mg/dL   BUN 19 7 - 25 mg/dL   Creat 5.63 8.75 - 6.43 mg/dL   eGFR 78 > OR = 60 PI/RJJ/8.84Z6   BUN/Creatinine Ratio SEE NOTE: 6 - 22 (calc)   Sodium 140 135 - 146 mmol/L   Potassium 4.2 3.5 - 5.3 mmol/L   Chloride 104 98 - 110 mmol/L   CO2 28 20 - 32 mmol/L   Calcium 9.3 8.6 - 10.2 mg/dL   Total Protein 7.0 6.1 - 8.1 g/dL   Albumin 4.6 3.6 - 5.1 g/dL    Globulin 2.4 1.9 - 3.7 g/dL (calc)   AG Ratio 1.9 1.0 - 2.5 (calc)   Total Bilirubin 0.5 0.2 - 1.2 mg/dL   Alkaline phosphatase (APISO) 85 31 - 125 U/L   AST 13 10 - 35 U/L   ALT 14 6 - 29 U/L  CBC w/Diff/Platelet   Collection Time: 04/29/23  9:10 AM  Result Value Ref Range   WBC 6.2 3.8 - 10.8 Thousand/uL   RBC 4.73 3.80 - 5.10 Million/uL   Hemoglobin 12.7 11.7 - 15.5 g/dL   HCT 60.6 30.1 - 60.1 %   MCV 82.9 80.0 - 100.0 fL   MCH 26.8 (L) 27.0 - 33.0 pg   MCHC 32.4 32.0 - 36.0 g/dL   RDW 09.3 23.5 - 57.3 %   Platelets 220 140 - 400 Thousand/uL   MPV 11.1 7.5 - 12.5 fL   Neutro Abs 4,216 1,500 - 7,800 cells/uL   Absolute Lymphocytes 1,451 850 - 3,900 cells/uL   Absolute Monocytes 384 200 - 950 cells/uL   Eosinophils Absolute 99 15 - 500 cells/uL   Basophils Absolute 50 0 - 200 cells/uL   Neutrophils Relative % 68 %   Total Lymphocyte 23.4 %   Monocytes Relative 6.2 %   Eosinophils Relative 1.6 %   Basophils Relative 0.8 %  Uric acid   Collection Time: 04/29/23  9:10 AM  Result Value Ref Range   Uric Acid, Serum 5.4 2.5 - 7.0 mg/dL       Assessment & Plan:   1. Otalgia, left (Primary) Ear drops Do allergy meds and nose sprays -  Cover  for possible outer ear inflammation/infection May also be some residual pain/inflammation from [facility] which looks successfully tx with the amoxicillin    ICD-10-CM   1. Otalgia, left  H92.02 neomycin-polymyxin-hydrocortisone (CORTISPORIN) OTIC solution    2. S/P total hysterectomy and bilateral salpingo-oophorectomy  Z90.710    Z90.79    Z90.722    will see if estrogen only HRT is an option with her sx and other med hx, it may be more complicated with prothrombin gene mutation hx?    3. Lynch syndrome  Z15.09     4. Prothrombin gene mutation [facility])  D68.52    previously consulted with Dr. Smith Robert hem/onc    5. Atrophic vaginitis  N95.2 Estradiol 10 MCG TABS vaginal tablet   vaginal estrogens recommended - tablet insert rx'd, if not  covered can do cream    6. Recurrent vaginitis  N76.0 Estradiol 10 MCG TABS vaginal tablet    7. Surgical menopause, symptomatic  E89.41 Estradiol 10 MCG TABS vaginal tablet         Danelle Berry, PA-C 10/13/23 8:56 AM

## 2023-10-15 ENCOUNTER — Other Ambulatory Visit: Payer: Self-pay | Admitting: Physician Assistant

## 2023-10-15 DIAGNOSIS — E8881 Metabolic syndrome: Secondary | ICD-10-CM

## 2023-10-15 DIAGNOSIS — K760 Fatty (change of) liver, not elsewhere classified: Secondary | ICD-10-CM

## 2023-10-15 DIAGNOSIS — E119 Type 2 diabetes mellitus without complications: Secondary | ICD-10-CM

## 2023-10-15 DIAGNOSIS — E66812 Obesity, class 2: Secondary | ICD-10-CM

## 2023-10-15 NOTE — Telephone Encounter (Signed)
 Requested medication (s) are due for refill today: yes  Requested medication (s) are on the active medication list: yes  Last refill:  07/19/23 #6/0  Future visit scheduled: yes  Notes to clinic:  Unable to refill per protocol, medication not assigned to the refill protocol.      Requested Prescriptions  Pending Prescriptions Disp Refills   MOUNJARO 10 MG/0.5ML Pen [Pharmacy Med Name: MOUNJARO 10 MG/0.5 ML PEN]      Sig: INJECT 10 MG INTO THE SKIN ONE TIME PER WEEK     Off-Protocol Failed - 10/15/2023  4:23 PM      Failed - Medication not assigned to a protocol, review manually.      Failed - Valid encounter within last 12 months    Recent Outpatient Visits           2 days ago Otalgia, left   [facility] Danelle Berry, PA-C       Future Appointments             In 2 weeks Danelle Berry, PA-C [facility], [facility]

## 2023-10-17 ENCOUNTER — Other Ambulatory Visit: Payer: Self-pay | Admitting: Physician Assistant

## 2023-10-17 DIAGNOSIS — F330 Major depressive disorder, recurrent, mild: Secondary | ICD-10-CM

## 2023-10-19 ENCOUNTER — Encounter: Payer: Self-pay | Admitting: Family Medicine

## 2023-10-19 ENCOUNTER — Other Ambulatory Visit: Payer: Self-pay | Admitting: Physician Assistant

## 2023-10-19 DIAGNOSIS — K760 Fatty (change of) liver, not elsewhere classified: Secondary | ICD-10-CM

## 2023-10-19 DIAGNOSIS — E119 Type 2 diabetes mellitus without complications: Secondary | ICD-10-CM

## 2023-10-19 DIAGNOSIS — E8881 Metabolic syndrome: Secondary | ICD-10-CM

## 2023-10-19 DIAGNOSIS — F330 Major depressive disorder, recurrent, mild: Secondary | ICD-10-CM

## 2023-10-19 MED ORDER — TIRZEPATIDE 10 MG/0.5ML ~~LOC~~ SOAJ
10.0000 mg | SUBCUTANEOUS | 0 refills | Status: DC
Start: 2023-10-19 — End: 2023-11-15

## 2023-10-19 NOTE — Telephone Encounter (Signed)
 Requested Prescriptions  Pending Prescriptions Disp Refills   TRINTELLIX 20 MG TABS tablet [Pharmacy Med Name: TRINTELLIX 20 MG TABLET] 30 tablet 1    Sig: TAKE 1 TABLET BY MOUTH EVERY DAY     Psychiatry: Antidepressants - Serotonin Modulator Failed - 10/19/2023 10:00 AM      Failed - Valid encounter within last 6 months    Recent Outpatient Visits           6 days ago Otalgia, left   [facility] Danelle Berry, PA-C       Future Appointments             In 2 weeks Danelle Berry, PA-C [facility], [facility]            Passed - Completed PHQ-2 or PHQ-9 in the last 360 days

## 2023-11-04 ENCOUNTER — Ambulatory Visit: Payer: Self-pay | Admitting: Family Medicine

## 2023-11-09 ENCOUNTER — Other Ambulatory Visit: Payer: Self-pay | Admitting: Family Medicine

## 2023-11-09 DIAGNOSIS — E119 Type 2 diabetes mellitus without complications: Secondary | ICD-10-CM

## 2023-11-11 NOTE — Telephone Encounter (Signed)
 Requested medication (s) are due for refill today - unsure  Requested medication (s) are on the active medication list -no  Future visit scheduled -yes  Last refill: unsure  Notes to clinic: Requested Rx not listed on current medication list- sent for review of request  Requested Prescriptions  Pending Prescriptions Disp Refills   Continuous Glucose Sensor (DEXCOM G7 SENSOR) MISC [Pharmacy Med Name: DEXCOM G7 SENSOR] 3 each 2    Sig: 1 DEVICE BY DOES NOT APPLY ROUTE CONTINUOUS FOR 10 DAYS. APPLY DEVICE AND WEAR FOR 10 DAYS CONTINUOUSLY FOR GLUCOSE MONITORING     Endocrinology: Diabetes - Testing Supplies Failed - 11/11/2023 11:33 AM      Failed - Valid encounter within last 12 months    Recent Outpatient Visits           4 weeks ago Otalgia, left   [facility] Adeline Hone, PA-C       Future Appointments             In 4 days Adeline Hone, PA-C [facility], [facility]               Requested Prescriptions  Pending Prescriptions Disp Refills   Continuous Glucose Sensor (DEXCOM G7 SENSOR) MISC [Pharmacy Med Name: DEXCOM G7 SENSOR] 3 each 2    Sig: 1 DEVICE BY DOES NOT APPLY ROUTE CONTINUOUS FOR 10 DAYS. APPLY DEVICE AND WEAR FOR 10 DAYS CONTINUOUSLY FOR GLUCOSE MONITORING     Endocrinology: Diabetes - Testing Supplies Failed - 11/11/2023 11:33 AM      Failed - Valid encounter within last 12 months    Recent Outpatient Visits           4 weeks ago Otalgia, left   [facility] Adeline Hone, PA-C       Future Appointments             In 4 days Adeline Hone, PA-C [facility], [facility]

## 2023-11-15 ENCOUNTER — Encounter: Payer: Self-pay | Admitting: Family Medicine

## 2023-11-15 ENCOUNTER — Ambulatory Visit: Admitting: Family Medicine

## 2023-11-15 VITALS — BP 126/88 | HR 81 | Resp 16 | Ht 64.0 in | Wt 231.0 lb

## 2023-11-15 DIAGNOSIS — E1169 Type 2 diabetes mellitus with other specified complication: Secondary | ICD-10-CM

## 2023-11-15 DIAGNOSIS — Z6839 Body mass index (BMI) 39.0-39.9, adult: Secondary | ICD-10-CM

## 2023-11-15 DIAGNOSIS — Z90710 Acquired absence of both cervix and uterus: Secondary | ICD-10-CM | POA: Diagnosis not present

## 2023-11-15 DIAGNOSIS — Z9079 Acquired absence of other genital organ(s): Secondary | ICD-10-CM

## 2023-11-15 DIAGNOSIS — D6852 Prothrombin gene mutation: Secondary | ICD-10-CM

## 2023-11-15 DIAGNOSIS — E785 Hyperlipidemia, unspecified: Secondary | ICD-10-CM | POA: Diagnosis not present

## 2023-11-15 DIAGNOSIS — F330 Major depressive disorder, recurrent, mild: Secondary | ICD-10-CM

## 2023-11-15 DIAGNOSIS — Z23 Encounter for immunization: Secondary | ICD-10-CM | POA: Diagnosis not present

## 2023-11-15 DIAGNOSIS — Z90722 Acquired absence of ovaries, bilateral: Secondary | ICD-10-CM

## 2023-11-15 DIAGNOSIS — E119 Type 2 diabetes mellitus without complications: Secondary | ICD-10-CM

## 2023-11-15 DIAGNOSIS — E66812 Obesity, class 2: Secondary | ICD-10-CM

## 2023-11-15 LAB — POCT GLYCOSYLATED HEMOGLOBIN (HGB A1C): Hemoglobin A1C: 5.1 % (ref 4.0–5.6)

## 2023-11-15 MED ORDER — TIRZEPATIDE 12.5 MG/0.5ML ~~LOC~~ SOAJ: 12.5000 mg | SUBCUTANEOUS | 1 refills | Status: DC

## 2023-11-15 NOTE — Progress Notes (Unsigned)
 Name: Joanna Scott   MRN: [id number]    DOB: July 31, 1975   Date:11/15/2023       Progress Note  Chief Complaint  Patient presents with   Medical Management of Chronic Issues     Subjective:   Joanna Scott is a 48 y.o. female, presents to clinic for routine follow up on chronic conditions  Mounjaro - feels like dose needs to be increased she is feeling ravenous and sugars on dexcom  Down 50 lbs Wt Readings from Last 5 Encounters:  11/15/23 231 lb (104.8 kg)  10/13/23 232 lb (105.2 kg)  07/29/23 233 lb (105.7 kg)  04/28/23 242 lb 9.6 oz (110 kg)  10/27/22 277 lb 6.4 oz (125.8 kg)   BMI Readings from Last 5 Encounters:  11/15/23 39.65 kg/m  10/13/23 39.82 kg/m  07/29/23 39.99 kg/m  04/28/23 41.64 kg/m  10/27/22 47.62 kg/m     Lab Results  Component Value Date   HGBA1C 5.5 04/29/2023   HGBA1C 6.8 (H) 10/27/2022   HGBA1C 7.4 (H) 04/28/2022   HGBA1C 6.8 (H) 03/27/2021   HGBA1C 5.9 (H) 12/24/2020          Current Outpatient Medications:    aspirin EC 81 MG tablet, Take 81 mg by mouth daily., Disp: , Rfl:    atorvastatin  (LIPITOR) 20 MG tablet, TAKE 1 TABLET BY MOUTH EVERYDAY AT BEDTIME, Disp: 90 tablet, Rfl: 3   cetirizine  (ZYRTEC ) 10 MG tablet, Take 1 tablet (10 mg total) by mouth daily as needed for allergies., Disp: , Rfl:    Continuous Glucose Sensor (DEXCOM G7 SENSOR) MISC, 1 DEVICE BY DOES NOT APPLY ROUTE CONTINUOUS FOR 10 DAYS. APPLY DEVICE AND WEAR FOR 10 DAYS CONTINUOUSLY FOR GLUCOSE MONITORING, Disp: 3 each, Rfl: 2   CONTOUR NEXT TEST test strip, 3 (three) times daily., Disp: , Rfl:    EPINEPHrine  0.3 mg/0.3 mL IJ SOAJ injection, Inject 0.3 mg into the muscle as needed for anaphylaxis., Disp: 2 each, Rfl: 0   Estradiol  10 MCG TABS vaginal tablet, Place 1 tablet (10 mcg total) vaginally 2 (two) times a week., Disp: 8 tablet, Rfl: 11   hydrocortisone 2.5 % cream, Apply 1 Application topically as needed., Disp: , Rfl:    ketoconazole  (NIZORAL ) 2 % shampoo,  APPLY 1 APPLICATION TOPICALLY 2 (TWO) TIMES A WEEK., Disp: 120 mL, Rfl: 2   pantoprazole  (PROTONIX ) 40 MG tablet, TAKE 1 TABLET BY MOUTH EVERY DAY IN THE MORNING, Disp: 90 tablet, Rfl: 3   tirzepatide (MOUNJARO) 10 MG/0.5ML Pen, Inject 10 mg into the skin once a week., Disp: 6 mL, Rfl: 0   vortioxetine  HBr (TRINTELLIX ) 20 MG TABS tablet, TAKE 1 TABLET BY MOUTH EVERY DAY, Disp: 90 tablet, Rfl: 0  Patient Active Problem List   Diagnosis Date Noted   Finger pain, left 04/30/2023   Fatty liver disease, nonalcoholic 12/24/2020   Atherosclerosis 12/24/2020   OSA on CPAP 09/23/2020   Type 2 diabetes mellitus without complication, without long-term current use of insulin  (HCC) 09/23/2020   Lynch syndrome 06/12/2020   Allergy with anaphylaxis due to food 06/12/2020   Metabolic syndrome 06/12/2020   Iron deficiency anemia 12/14/2019   Hypothyroidism 10/04/2019   Seborrheic dermatitis of scalp 10/04/2019   Class 2 severe obesity with serious comorbidity and body mass index (BMI) of 39.0 to 39.9 in adult [facility]) 10/04/2019   Prothrombin gene mutation (HCC) 10/04/2019   S/P total hysterectomy and bilateral salpingo-oophorectomy 06/26/2019   Hyperlipidemia 04/10/2019   Major depressive disorder with  current active episode 04/10/2019   Allergic rhinitis 04/10/2019   PCOS (polycystic ovarian syndrome) 10/20/2012    Past Surgical History:  Procedure Laterality Date   CESAREAN SECTION     CESAREAN SECTION N/A 12/17/2014   Procedure: CESAREAN SECTION;  Surgeon: Kris Pester, MD;  Location: [facility] ORS;  Service: Obstetrics;  Laterality: N/A;   CYSTOSCOPY  06/26/2019   Procedure: CYSTOSCOPY;  Surgeon: Ward, Margarie Shay, MD;  Location: [facility] ORS;  Service: Gynecology;;   CYSTOSCOPY WITH STENT PLACEMENT Right 06/26/2019   Procedure: CYSTOSCOPY WITH STENT PLACEMENT, Cystotomy;  Surgeon: Ward, Margarie Shay, MD;  Location: [facility] ORS;  Service: Gynecology;  Laterality: Right;   DIAGNOSTIC LAPAROSCOPY     DILATION  AND CURETTAGE OF UTERUS     LAPAROSCOPIC CHOLECYSTECTOMY     Lynch syndrome     ROBOTIC ASSISTED TOTAL HYSTERECTOMY WITH BILATERAL SALPINGO OOPHERECTOMY Bilateral 06/26/2019   Procedure: XI ROBOTIC ASSISTED TOTAL HYSTERECTOMY WITH BILATERAL SALPINGO OOPHORECTOMY;  Surgeon: Ward, Margarie Shay, MD;  Location: [facility] ORS;  Service: Gynecology;  Laterality: Bilateral;    Family History  Problem Relation Age of Onset   Hypertension Mother    Depression Mother    COPD Mother    Ovarian cancer Sister    Deep vein thrombosis Maternal Grandmother    Stroke Maternal Grandfather    Heart attack Maternal Grandfather    Malignant hyperthermia Cousin    Encopresis Son    Malignant hyperthermia Other    Breast cancer Neg Hx     Social History   Tobacco Use   Smoking status: Former    Current packs/day: 1.00    Average packs/day: 1 pack/day for 5.0 years (5.0 ttl pk-yrs)    Types: Cigarettes   Smokeless tobacco: Never   Tobacco comments:    pt stopped at age 45  Vaping Use   Vaping status: Never Used  Substance Use Topics   Alcohol use: Yes    Comment: 2 times a year for special occasions   Drug use: No     Allergies  Allergen Reactions   Peach [Prunus Persica] Anaphylaxis and Nausea And Vomiting   Metformin  And Related Other (See Comments)    Severe GI SE and intolerance   Betula Alba Oil Itching    Sneezing   Vitex Itching    Sneezing and itchy eyes  Amelia Jurist and [facility] Maintenance  Topic Date Due   OPHTHALMOLOGY EXAM  Never done   COVID-19 Vaccine (5 - 2024-25 season) 03/14/2023   HEMOGLOBIN A1C  10/28/2023   INFLUENZA VACCINE  02/11/2024   FOOT EXAM  04/27/2024   Diabetic kidney evaluation - eGFR measurement  04/28/2024   Diabetic kidney evaluation - Urine ACR  04/28/2024   Colonoscopy  07/23/2032   DTaP/Tdap/Td (2 - Td or Tdap) 10/26/2032   Pneumococcal Vaccine 3-56 Years old  Completed   Hepatitis C Screening  Completed   HIV Screening  Completed   HPV VACCINES   Aged Out   Meningococcal B Vaccine  Aged Out    Chart Review Today: ***  Review of Systems   Objective:   Vitals:   11/15/23 1421  BP: 126/88  Pulse: 81  Resp: 16  SpO2: 98%  Weight: 231 lb (104.8 kg)  Height: 5\' 4"  (1.626 m)    Body mass index is 39.65 kg/m.  Physical Exam   Functional Status Survey:   Results for orders placed or performed in visit on 04/28/23  Hemoglobin A1c  Collection Time: 04/29/23  9:10 AM  Result Value Ref Range   Hgb A1c MFr Bld 5.5 <5.7 % of total Hgb   Mean Plasma Glucose 111 mg/dL   eAG (mmol/L) 6.2 mmol/L  Microalbumin / creatinine urine ratio   Collection Time: 04/29/23  9:10 AM  Result Value Ref Range   Creatinine, Urine 281 (H) 20 - 275 mg/dL   Microalb, Ur 0.7 mg/dL   Microalb Creat Ratio 2 <30 mg/g creat  TSH   Collection Time: 04/29/23  9:10 AM  Result Value Ref Range   TSH 1.83 mIU/L  T4   Collection Time: 04/29/23  9:10 AM  Result Value Ref Range   T4, Total 8.4 5.1 - 11.9 mcg/dL  Lipid Profile   Collection Time: 04/29/23  9:10 AM  Result Value Ref Range   Cholesterol 140 <200 mg/dL   HDL 39 (L) > OR = 50 mg/dL   Triglycerides 93 <045 mg/dL   LDL Cholesterol (Calc) 82 mg/dL (calc)   Total CHOL/HDL Ratio 3.6 <5.0 (calc)   Non-HDL Cholesterol (Calc) 101 <130 mg/dL (calc)  COMPLETE METABOLIC PANEL WITH GFR   Collection Time: 04/29/23  9:10 AM  Result Value Ref Range   Glucose, Bld 87 65 - 99 mg/dL   BUN 19 7 - 25 mg/dL   Creat 4.09 8.11 - 9.14 mg/dL   eGFR 78 > OR = 60 NW/GNF/6.21H0   BUN/Creatinine Ratio SEE NOTE: 6 - 22 (calc)   Sodium 140 135 - 146 mmol/L   Potassium 4.2 3.5 - 5.3 mmol/L   Chloride 104 98 - 110 mmol/L   CO2 28 20 - 32 mmol/L   Calcium  9.3 8.6 - 10.2 mg/dL   Total Protein 7.0 6.1 - 8.1 g/dL   Albumin 4.6 3.6 - 5.1 g/dL   Globulin 2.4 1.9 - 3.7 g/dL (calc)   AG Ratio 1.9 1.0 - 2.5 (calc)   Total Bilirubin 0.5 0.2 - 1.2 mg/dL   Alkaline phosphatase (APISO) 85 31 - 125 U/L   AST 13 10 -  35 U/L   ALT 14 6 - 29 U/L  CBC w/Diff/Platelet   Collection Time: 04/29/23  9:10 AM  Result Value Ref Range   WBC 6.2 3.8 - 10.8 Thousand/uL   RBC 4.73 3.80 - 5.10 Million/uL   Hemoglobin 12.7 11.7 - 15.5 g/dL   HCT 86.5 78.4 - 69.6 %   MCV 82.9 80.0 - 100.0 fL   MCH 26.8 (L) 27.0 - 33.0 pg   MCHC 32.4 32.0 - 36.0 g/dL   RDW 29.5 28.4 - 13.2 %   Platelets 220 140 - 400 Thousand/uL   MPV 11.1 7.5 - 12.5 fL   Neutro Abs 4,216 1,500 - 7,800 cells/uL   Absolute Lymphocytes 1,451 850 - 3,900 cells/uL   Absolute Monocytes 384 200 - 950 cells/uL   Eosinophils Absolute 99 15 - 500 cells/uL   Basophils Absolute 50 0 - 200 cells/uL   Neutrophils Relative % 68 %   Total Lymphocyte 23.4 %   Monocytes Relative 6.2 %   Eosinophils Relative 1.6 %   Basophils Relative 0.8 %  Uric acid   Collection Time: 04/29/23  9:10 AM  Result Value Ref Range   Uric Acid, Serum 5.4 2.5 - 7.0 mg/dL      Assessment & Plan:   Type 2 diabetes mellitus without complication, without long-term current use of insulin  (HCC)  Immunization due -     Pneumococcal conjugate vaccine 20-valent     No  follow-ups on file.   Adeline Hone, PA-C 11/15/23 2:43 PM

## 2023-11-18 MED ORDER — ATORVASTATIN CALCIUM 20 MG PO TABS: ORAL_TABLET | ORAL | 3 refills | Status: AC

## 2023-11-18 MED ORDER — PANTOPRAZOLE SODIUM 40 MG PO TBEC: DELAYED_RELEASE_TABLET | ORAL | 3 refills | Status: AC

## 2023-11-18 MED ORDER — VORTIOXETINE HBR 20 MG PO TABS: 20.0000 mg | ORAL_TABLET | Freq: Every day | ORAL | 2 refills | Status: AC

## 2023-11-18 NOTE — Assessment & Plan Note (Signed)
 On statin, doing well, no SE  Lab Results  Component Value Date   CHOL 140 04/29/2023   HDL 39 (L) 04/29/2023   LDLCALC 82 04/29/2023   TRIG 93 04/29/2023   CHOLHDL 3.6 04/29/2023   Will do lipids again annually

## 2023-11-18 NOTE — Assessment & Plan Note (Signed)
 Doing well on mounjaro, but wants to do the next dose increase, due to increased blood sugars on CGM and increased appetite POC A1c today

## 2023-11-18 NOTE — Assessment & Plan Note (Signed)
 Uses asa daily, around surgeries has done lovenox  shots (See above)

## 2023-11-18 NOTE — Assessment & Plan Note (Signed)
 Menopausal sx - see HPI for f/up discussion today - with surgical and Lynch syndrome history estrogen replacement would be appropriate but she does have additional history of prothrombin gene mutation and patient was encouraged to reach out to heme-onc or make an appointment to see if they can weigh in about the safety of HRT?

## 2023-11-18 NOTE — Assessment & Plan Note (Signed)
 Significant diet and exercise efforts which she has been doing for many years but with the recent addition of Mounjaro for diabetes and continued efforts she has lost 50 more pounds that she reports a plateau but building muscle Weights reviewed and BMI and obesity diagnosis and categories updated today Wt Readings from Last 5 Encounters:  11/15/23 231 lb (104.8 kg)  10/13/23 232 lb (105.2 kg)  07/29/23 233 lb (105.7 kg)  04/28/23 242 lb 9.6 oz (110 kg)  10/27/22 277 lb 6.4 oz (125.8 kg)   BMI Readings from Last 5 Encounters:  11/15/23 39.65 kg/m  10/13/23 39.82 kg/m  07/29/23 39.99 kg/m  04/28/23 41.64 kg/m  10/27/22 47.62 kg/m

## 2024-03-15 ENCOUNTER — Other Ambulatory Visit: Payer: Self-pay | Admitting: Family Medicine

## 2024-03-15 DIAGNOSIS — E119 Type 2 diabetes mellitus without complications: Secondary | ICD-10-CM

## 2024-03-15 NOTE — Telephone Encounter (Signed)
 Requested Prescriptions  Pending Prescriptions Disp Refills   Continuous Glucose Sensor (DEXCOM G7 SENSOR) MISC [Pharmacy Med Name: DEXCOM G7 SENSOR] 3 each 0    Sig: 1 DEVICE BY DOES NOT APPLY ROUTE CONTINUOUS FOR 10 DAYS. APPLY DEVICE AND WEAR FOR 10 DAYS CONTINUOUSLY FOR GLUCOSE MONITORING     Endocrinology: Diabetes - Testing Supplies Passed - 03/15/2024  3:26 PM      Passed - Valid encounter within last 12 months    Recent Outpatient Visits           4 months ago Type 2 diabetes mellitus without complication, without long-term current use of insulin  [facility])   [facility] Leavy Mole, PA-C   5 months ago Otalgia, left   [facility] Leavy Mole, PA-C

## 2024-03-21 ENCOUNTER — Emergency Department

## 2024-03-21 ENCOUNTER — Other Ambulatory Visit: Payer: Self-pay

## 2024-03-21 DIAGNOSIS — Z7982 Long term (current) use of aspirin: Secondary | ICD-10-CM | POA: Diagnosis not present

## 2024-03-21 DIAGNOSIS — E785 Hyperlipidemia, unspecified: Secondary | ICD-10-CM | POA: Diagnosis not present

## 2024-03-21 DIAGNOSIS — E8881 Metabolic syndrome: Secondary | ICD-10-CM | POA: Insufficient documentation

## 2024-03-21 DIAGNOSIS — F339 Major depressive disorder, recurrent, unspecified: Secondary | ICD-10-CM | POA: Diagnosis not present

## 2024-03-21 DIAGNOSIS — E119 Type 2 diabetes mellitus without complications: Secondary | ICD-10-CM | POA: Diagnosis not present

## 2024-03-21 DIAGNOSIS — R109 Unspecified abdominal pain: Secondary | ICD-10-CM | POA: Diagnosis present

## 2024-03-21 DIAGNOSIS — K859 Acute pancreatitis without necrosis or infection, unspecified: Secondary | ICD-10-CM | POA: Diagnosis not present

## 2024-03-21 DIAGNOSIS — E039 Hypothyroidism, unspecified: Secondary | ICD-10-CM | POA: Diagnosis not present

## 2024-03-21 DIAGNOSIS — Z79899 Other long term (current) drug therapy: Secondary | ICD-10-CM | POA: Insufficient documentation

## 2024-03-21 DIAGNOSIS — G4733 Obstructive sleep apnea (adult) (pediatric): Secondary | ICD-10-CM | POA: Diagnosis not present

## 2024-03-21 LAB — BASIC METABOLIC PANEL WITH GFR
Anion gap: 11 (ref 5–15)
BUN: 19 mg/dL (ref 6–20)
CO2: 25 mmol/L (ref 22–32)
Calcium: 9.3 mg/dL (ref 8.9–10.3)
Chloride: 103 mmol/L (ref 98–111)
Creatinine, Ser: 0.99 mg/dL (ref 0.44–1.00)
GFR, Estimated: >60 mL/min (ref >60)
Glucose, Bld: 98 mg/dL (ref 70–99)
Potassium: 4 mmol/L (ref 3.5–5.1)
Sodium: 139 mmol/L (ref 135–145)

## 2024-03-21 LAB — CBC WITH DIFFERENTIAL/PLATELET
Abs Immature Granulocytes: 0.04 K/uL (ref 0.00–0.07)
Basophils Absolute: 0.1 K/uL (ref 0.0–0.1)
Basophils Relative: 1 %
Eosinophils Absolute: 0.2 K/uL (ref 0.0–0.5)
Eosinophils Relative: 1 %
HCT: 38.6 % (ref 36.0–46.0)
Hemoglobin: 12.8 g/dL (ref 12.0–15.0)
Immature Granulocytes: 0 %
Lymphocytes Relative: 23 %
Lymphs Abs: 2.7 K/uL (ref 0.7–4.0)
MCH: 27.6 pg (ref 26.0–34.0)
MCHC: 33.2 g/dL (ref 30.0–36.0)
MCV: 83.4 fL (ref 80.0–100.0)
Monocytes Absolute: 0.7 K/uL (ref 0.1–1.0)
Monocytes Relative: 6 %
Neutro Abs: 8.3 K/uL — ABNORMAL HIGH (ref 1.7–7.7)
Neutrophils Relative %: 69 %
Platelets: 245 K/uL (ref 150–400)
RBC: 4.63 MIL/uL (ref 3.87–5.11)
RDW: 13.4 % (ref 11.5–15.5)
WBC: 12 K/uL — ABNORMAL HIGH (ref 4.0–10.5)
nRBC: 0 % (ref 0.0–0.2)

## 2024-03-21 LAB — URINALYSIS, ROUTINE W REFLEX MICROSCOPIC
Bilirubin Urine: NEGATIVE
Glucose, UA: NEGATIVE mg/dL
Hgb urine dipstick: NEGATIVE
Ketones, ur: NEGATIVE mg/dL
Leukocytes,Ua: NEGATIVE
Nitrite: NEGATIVE
Protein, ur: NEGATIVE mg/dL
Specific Gravity, Urine: 1.008 (ref 1.005–1.030)
pH: 5 (ref 5.0–8.0)

## 2024-03-21 LAB — POC URINE PREG, ED: Preg Test, Ur: NEGATIVE

## 2024-03-21 NOTE — ED Triage Notes (Signed)
 Pt reports lower back pain that began last night, pt denies dysuria but states that she has had a kidney infection in the past and the symptoms feel the same now as they did then.

## 2024-03-22 ENCOUNTER — Inpatient Hospital Stay

## 2024-03-22 ENCOUNTER — Observation Stay
Admission: EM | Admit: 2024-03-22 | Discharge: 2024-03-24 | Disposition: A | Discharge status: Home / Self Care | Attending: Internal Medicine | Admitting: Internal Medicine

## 2024-03-22 ENCOUNTER — Encounter: Payer: Self-pay | Admitting: Family Medicine

## 2024-03-22 DIAGNOSIS — K859 Acute pancreatitis without necrosis or infection, unspecified: Secondary | ICD-10-CM | POA: Diagnosis present

## 2024-03-22 DIAGNOSIS — E8881 Metabolic syndrome: Secondary | ICD-10-CM | POA: Diagnosis present

## 2024-03-22 DIAGNOSIS — E039 Hypothyroidism, unspecified: Secondary | ICD-10-CM | POA: Diagnosis present

## 2024-03-22 DIAGNOSIS — K8500 Idiopathic acute pancreatitis without necrosis or infection: Principal | ICD-10-CM

## 2024-03-22 DIAGNOSIS — F329 Major depressive disorder, single episode, unspecified: Secondary | ICD-10-CM | POA: Diagnosis present

## 2024-03-22 DIAGNOSIS — E785 Hyperlipidemia, unspecified: Secondary | ICD-10-CM | POA: Diagnosis present

## 2024-03-22 DIAGNOSIS — E119 Type 2 diabetes mellitus without complications: Secondary | ICD-10-CM

## 2024-03-22 DIAGNOSIS — G4733 Obstructive sleep apnea (adult) (pediatric): Secondary | ICD-10-CM

## 2024-03-22 LAB — CBG MONITORING, ED
Glucose-Capillary: 77 mg/dL (ref 70–99)
Glucose-Capillary: 82 mg/dL (ref 70–99)
Glucose-Capillary: 80 mg/dL (ref 70–99)

## 2024-03-22 LAB — HEPATIC FUNCTION PANEL
ALT: 25 U/L (ref 0–44)
AST: 19 U/L (ref 15–41)
Albumin: 4.2 g/dL (ref 3.5–5.0)
Alkaline Phosphatase: 64 U/L (ref 38–126)
Bilirubin, Direct: <0.1 mg/dL (ref 0.0–0.2)
Total Bilirubin: 0.7 mg/dL (ref 0.0–1.2)
Total Protein: 7.2 g/dL (ref 6.5–8.1)

## 2024-03-22 LAB — HIV ANTIBODY (ROUTINE TESTING W REFLEX): HIV Screen 4th Generation wRfx: NONREACTIVE

## 2024-03-22 LAB — LIPASE, BLOOD: Lipase: 50 U/L (ref 11–51)

## 2024-03-22 MED ORDER — ACETAMINOPHEN 650 MG RE SUPP: 650.0000 mg | Freq: Four times a day (QID) | RECTAL | Status: DC | PRN

## 2024-03-22 MED ORDER — ONDANSETRON HCL 4 MG/2ML IJ SOLN
4.0000 mg | Freq: Once | INTRAMUSCULAR | Status: AC
  Administered 2024-03-22: 4 mg via INTRAVENOUS
  Filled 2024-03-22: qty 2

## 2024-03-22 MED ORDER — ONDANSETRON HCL 4 MG PO TABS: 4.0000 mg | ORAL_TABLET | Freq: Four times a day (QID) | ORAL | Status: DC | PRN

## 2024-03-22 MED ORDER — HYDROMORPHONE HCL 1 MG/ML IJ SOLN
1.0000 mg | Freq: Once | INTRAMUSCULAR | Status: AC
  Administered 2024-03-22: 1 mg via INTRAVENOUS
  Filled 2024-03-22: qty 1

## 2024-03-22 MED ORDER — HYDROMORPHONE HCL 1 MG/ML IJ SOLN: 0.5000 mg | INTRAMUSCULAR | Status: DC | PRN

## 2024-03-22 MED ORDER — ENOXAPARIN SODIUM 40 MG/0.4ML IJ SOSY: 40.0000 mg | PREFILLED_SYRINGE | INTRAMUSCULAR | Status: DC

## 2024-03-22 MED ORDER — SODIUM CHLORIDE 0.9 % IV SOLN
25.0000 mg | Freq: Once | INTRAVENOUS | Status: DC
  Filled 2024-03-22: qty 1

## 2024-03-22 MED ORDER — PANTOPRAZOLE SODIUM 40 MG IV SOLR
40.0000 mg | Freq: Two times a day (BID) | INTRAVENOUS | Status: DC
  Administered 2024-03-22 (×2): 40 mg via INTRAVENOUS
  Filled 2024-03-22 (×2): qty 10

## 2024-03-22 MED ORDER — SODIUM CHLORIDE 0.9 % IV SOLN: INTRAVENOUS | Status: DC

## 2024-03-22 MED ORDER — ACETAMINOPHEN 325 MG PO TABS
650.0000 mg | ORAL_TABLET | Freq: Four times a day (QID) | ORAL | Status: DC | PRN
  Administered 2024-03-22 (×2): 650 mg via ORAL
  Filled 2024-03-22 (×2): qty 2

## 2024-03-22 MED ORDER — SODIUM CHLORIDE 0.9 % IV BOLUS (SEPSIS)
1000.0000 mL | Freq: Once | INTRAVENOUS | Status: AC
  Administered 2024-03-22: 1000 mL via INTRAVENOUS

## 2024-03-22 MED ORDER — ONDANSETRON HCL 4 MG/2ML IJ SOLN: 4.0000 mg | Freq: Four times a day (QID) | INTRAMUSCULAR | Status: DC | PRN

## 2024-03-22 MED ORDER — MORPHINE SULFATE (PF) 4 MG/ML IV SOLN
4.0000 mg | Freq: Once | INTRAVENOUS | Status: AC
  Administered 2024-03-22: 4 mg via INTRAVENOUS
  Filled 2024-03-22: qty 1

## 2024-03-22 MED ORDER — OXYCODONE HCL 5 MG PO TABS
5.0000 mg | ORAL_TABLET | ORAL | Status: DC | PRN
  Filled 2024-03-22: qty 1

## 2024-03-22 MED ORDER — PANTOPRAZOLE SODIUM 40 MG IV SOLR
40.0000 mg | Freq: Once | INTRAVENOUS | Status: AC
  Administered 2024-03-22: 40 mg via INTRAVENOUS
  Filled 2024-03-22: qty 10

## 2024-03-22 MED ORDER — INSULIN ASPART 100 UNIT/ML IJ SOLN
0.0000 [IU] | Freq: Three times a day (TID) | INTRAMUSCULAR | Status: DC
  Administered 2024-03-24: 1 [IU] via SUBCUTANEOUS
  Filled 2024-03-22: qty 1

## 2024-03-22 MED ORDER — ASPIRIN 81 MG PO TBEC
81.0000 mg | DELAYED_RELEASE_TABLET | Freq: Every day | ORAL | Status: DC
Start: 2024-03-22 — End: 2024-03-24
  Administered 2024-03-22 – 2024-03-24 (×3): 81 mg via ORAL
  Filled 2024-03-22 (×3): qty 1

## 2024-03-22 MED ORDER — POLYETHYLENE GLYCOL 3350 17 G PO PACK: 17.0000 g | PACK | Freq: Every day | ORAL | Status: DC | PRN

## 2024-03-22 MED ORDER — ENOXAPARIN SODIUM 60 MG/0.6ML IJ SOSY
50.0000 mg | PREFILLED_SYRINGE | INTRAMUSCULAR | Status: DC
  Administered 2024-03-22 – 2024-03-24 (×3): 50 mg via SUBCUTANEOUS
  Filled 2024-03-22 (×3): qty 0.6

## 2024-03-22 NOTE — Progress Notes (Signed)
 PHARMACIST - PHYSICIAN COMMUNICATION  CONCERNING:  Enoxaparin  (Lovenox ) for DVT Prophylaxis    RECOMMENDATION: Patient was prescribed enoxaprin 40mg  q24 hours for VTE prophylaxis.   Filed Weights   03/21/24 2224  Weight: 104.3 kg (230 lb)    Body mass index is 39.48 kg/m.  Estimated Creatinine Clearance: 82.6 mL/min (by C-G formula based on SCr of 0.99 mg/dL).   Based on [facility] policy patient is candidate for enoxaparin  0.5mg /kg TBW SQ every 24 hours based on BMI being >30.   DESCRIPTION: Pharmacy has adjusted enoxaparin  dose per [facility] policy.  Patient is now receiving enoxaparin  50 mg every 24 hours    Estill CHRISTELLA Lutes, PharmD, BCPS Clinical Pharmacist 03/22/2024 10:28 AM

## 2024-03-22 NOTE — ED Notes (Signed)
 Patient up to bathroom.  She asked if she could have coffee.  She is npo and they are coming in to do an ultrasound now.  Says if she cannot have coffee she would like some ibuprofen  for headache due to lack of caffeine.  Agreeable to take tylenol  which is ordered for her headache.

## 2024-03-22 NOTE — ED Provider Notes (Signed)
 [facility] Provider Note    Event Date/Time   First MD Initiated Contact with Patient 03/22/24 0119     (approximate)   History   Back Pain   HPI  Joanna Scott is a 48 y.o. female with history of PCOS, hypothyroidism, GERD, diabetes who presents to the emergency department complaints of abdominal pain that radiates into her back.  She thinks that this could be a kidney infection.  Denies dysuria or hematuria.  Has had nausea without vomiting.  No diarrhea.  Has had previous cholecystectomy, hysterectomy and bilateral salpingo-oophorectomy.  States she did have some pain in the right lower quadrant was concerned about appendicitis as well but this has resolved.  Still having upper abdominal pain.  Denies chest pain or shortness of breath.   History provided by patient.    Past Medical History:  Diagnosis Date   Anemia    Anemia 04/10/2019   Anxiety    Bilateral polycystic ovarian syndrome 10/20/2012   Diabetes mellitus without complication (HCC)    Family history of adverse reaction to anesthesia    malignant hyperthermia in 2 first cousins    Family history of malignant hyperthermia    GERD (gastroesophageal reflux disease)    Hypothyroidism    Microcytosis 10/04/2019   Obesity affecting pregnancy    PCOS (polycystic ovarian syndrome)    Portal vein thrombosis    Postpartum care following cesarean delivery 12/19/2014   Preeclampsia in postpartum period    Pregnancy    Previous cesarean delivery, delivered 12/16/2014   Previous cesarean section    Prothrombin gene mutation (HCC)    S/P cesarean section 12/17/2014   Scalp psoriasis    Sleep apnea     Past Surgical History:  Procedure Laterality Date   CESAREAN SECTION     CESAREAN SECTION N/A 12/17/2014   Procedure: CESAREAN SECTION;  Surgeon: Garnette JONETTA Mace, MD;  Location: [facility] ORS;  Service: Obstetrics;  Laterality: N/A;   CYSTOSCOPY  06/26/2019   Procedure: CYSTOSCOPY;  Surgeon: Jakorian Marengo,  Mitzie BROCKS, MD;  Location: [facility] ORS;  Service: Gynecology;;   CYSTOSCOPY WITH STENT PLACEMENT Right 06/26/2019   Procedure: CYSTOSCOPY WITH STENT PLACEMENT, Cystotomy;  Surgeon: Lizette Pazos, Mitzie BROCKS, MD;  Location: [facility] ORS;  Service: Gynecology;  Laterality: Right;   DIAGNOSTIC LAPAROSCOPY     DILATION AND CURETTAGE OF UTERUS     LAPAROSCOPIC CHOLECYSTECTOMY     Lynch syndrome     ROBOTIC ASSISTED TOTAL HYSTERECTOMY WITH BILATERAL SALPINGO OOPHERECTOMY Bilateral 06/26/2019   Procedure: XI ROBOTIC ASSISTED TOTAL HYSTERECTOMY WITH BILATERAL SALPINGO OOPHORECTOMY;  Surgeon: Massie Mees, Mitzie BROCKS, MD;  Location: [facility] ORS;  Service: Gynecology;  Laterality: Bilateral;    MEDICATIONS:  Prior to Admission medications   Medication Sig Start Date End Date Taking? Authorizing Provider  aspirin  EC 81 MG tablet Take 81 mg by mouth daily.    [provider]  atorvastatin  (LIPITOR) 20 MG tablet TAKE 1 TABLET BY MOUTH EVERYDAY AT BEDTIME 11/18/23   Tapia, Leisa, PA-C  cetirizine  (ZYRTEC ) 10 MG tablet Take 1 tablet (10 mg total) by mouth daily as needed for allergies. 04/27/22   Tapia, Leisa, PA-C  Continuous Glucose Sensor (DEXCOM G7 SENSOR) MISC 1 DEVICE BY DOES NOT APPLY ROUTE CONTINUOUS FOR 10 DAYS. APPLY DEVICE AND WEAR FOR 10 DAYS CONTINUOUSLY FOR GLUCOSE MONITORING 03/15/24 03/25/24  Tapia, Leisa, PA-C  CONTOUR NEXT TEST test strip 3 (three) times daily. 06/25/22   [provider]  EPINEPHrine  0.3 mg/0.3 mL IJ  SOAJ injection Inject 0.3 mg into the muscle as needed for anaphylaxis. 06/22/22   Tapia, Leisa, PA-C  Estradiol  10 MCG TABS vaginal tablet Place 1 tablet (10 mcg total) vaginally 2 (two) times a week. 10/13/23   Tapia, Leisa, PA-C  hydrocortisone 2.5 % cream Apply 1 Application topically as needed. 08/23/22   [provider]  ketoconazole  (NIZORAL ) 2 % shampoo APPLY 1 APPLICATION TOPICALLY 2 (TWO) TIMES A WEEK. 04/24/21   Tapia, Leisa, PA-C  pantoprazole  (PROTONIX ) 40 MG tablet TAKE 1  TABLET BY MOUTH EVERY DAY IN THE MORNING 11/18/23   Tapia, Leisa, PA-C  tirzepatide  (MOUNJARO ) 12.5 MG/0.5ML Pen Inject 12.5 mg into the skin once a week. 11/15/23   Tapia, Leisa, PA-C  vortioxetine  HBr (TRINTELLIX ) 20 MG TABS tablet Take 1 tablet (20 mg total) by mouth daily. 11/18/23   Leavy Mole, PA-C    Physical Exam   Triage Vital Signs: ED Triage Vitals  Encounter Vitals Group     BP 03/21/24 2225 (!) 166/111     Girls Systolic BP Percentile --      Girls Diastolic BP Percentile --      Boys Systolic BP Percentile --      Boys Diastolic BP Percentile --      Pulse Rate 03/21/24 2225 78     Resp 03/21/24 2225 18     Temp 03/21/24 2225 98.6 F (37 C)     Temp Source 03/21/24 2225 Oral     SpO2 03/21/24 2225 100 %     Weight 03/21/24 2224 230 lb (104.3 kg)     Height 03/21/24 2224 5' 4 (1.626 m)     Head Circumference --      Peak Flow --      Pain Score 03/21/24 2223 7     Pain Loc --      Pain Education --      Exclude from Growth Chart --     Most recent vital signs: Vitals:   03/22/24 0228 03/22/24 0600  BP: (!) 154/79 120/73  Pulse: 66 77  Resp: 18 12  Temp: 98.6 F (37 C)   SpO2: 100% 99%    CONSTITUTIONAL: Alert, responds appropriately to questions.  Appears uncomfortable HEAD: Normocephalic, atraumatic EYES: Conjunctivae clear, pupils appear equal, sclera nonicteric ENT: normal nose; moist mucous membranes NECK: Supple, normal ROM CARD: RRR; S1 and S2 appreciated RESP: Normal chest excursion without splinting or tachypnea; breath sounds clear and equal bilaterally; no wheezes, no rhonchi, no rales, no hypoxia or respiratory distress, speaking full sentences ABD/GI: Non-distended; soft, tender throughout the upper abdomen without guarding or rebound BACK: The back appears normal EXT: Normal ROM in all joints; no deformity noted, no edema SKIN: Normal color for age and race; warm; no rash on exposed skin NEURO: Moves all extremities equally, normal  speech PSYCH: The patient's mood and manner are appropriate.   ED Results / Procedures / Treatments   LABS: (all labs ordered are listed, but only abnormal results are displayed) Labs Reviewed  CBC WITH DIFFERENTIAL/PLATELET - Abnormal; Notable for the following components:      Result Value   WBC 12.0 (*)    Neutro Abs 8.3 (*)    All other components within normal limits  URINALYSIS, ROUTINE W REFLEX MICROSCOPIC - Abnormal; Notable for the following components:   Color, Urine STRAW (*)    APPearance CLEAR (*)    All other components within normal limits  URINE CULTURE  BASIC METABOLIC PANEL WITH  GFR  HEPATIC FUNCTION PANEL  LIPASE, BLOOD  POC URINE PREG, ED     EKG:  EKG Interpretation Date/Time:    Ventricular Rate:    PR Interval:    QRS Duration:    QT Interval:    QTC Calculation:   R Axis:      Text Interpretation:           RADIOLOGY: My personal review and interpretation of imaging: CT scan shows uncomplicated pancreatitis.  Normal-appearing kidneys.  I have personally reviewed all radiology reports.   CT Renal Stone Study Result Date: 03/22/2024 EXAM: CT UROGRAM 03/22/2024 12:05:58 AM TECHNIQUE: CT of the abdomen and pelvis was performed before and after the administration of intravenous contrast as per CT urogram protocol. Multiplanar reformatted images as well as MIP urogram images are provided for review. Automated exposure control, iterative reconstruction, and/or weight based adjustment of the mA/kV was utilized to reduce the radiation dose to as low as reasonably achievable. COMPARISON: None available. CLINICAL HISTORY: Abdominal/flank pain, stone suspected. Pt reports lower back pain that began last night, pt denies dysuria but states that she has had a kidney infection in the past and the symptoms feel the same now as they did then. FINDINGS: LOWER CHEST: No acute abnormality. LIVER: The liver is unremarkable. GALLBLADDER AND BILE DUCTS:  Cholecystectomy. No biliary ductal dilatation. SPLEEN: No acute abnormality. PANCREAS: Hazy stranding about the pancreatic head with trace fluid compatible with acute pancreatitis. No organized fluid collection. No pancreatic ductal dilation. ADRENAL GLANDS: No acute abnormality. KIDNEYS, URETERS AND BLADDER: Punctate nonobstructing stone in the left kidney. No hydronephrosis. No perinephric or periureteral stranding. Urinary bladder is unremarkable. GI AND BOWEL: Stomach demonstrates no acute abnormality. There is no bowel obstruction. PERITONEUM AND RETROPERITONEUM: No ascites. No free air. VASCULATURE: Aortic atherosclerotic calcification. LYMPH NODES: No lymphadenopathy. REPRODUCTIVE ORGANS: No acute abnormality. BONES AND SOFT TISSUES: No acute osseous abnormality. No focal soft tissue abnormality. IMPRESSION: 1. Acute uncomplicated pancreatitis. Electronically signed by: Norman Gatlin MD 03/22/2024 12:16 AM EDT RP Workstation: [id number]     PROCEDURES:  Critical Care performed: No    Procedures    IMPRESSION / MDM / ASSESSMENT AND PLAN / ED COURSE  I reviewed the triage vital signs and the nursing notes.    Patient here with abdominal pain, nausea.  States she is concerned she has another kidney infection.     DIFFERENTIAL DIAGNOSIS (includes but not limited to):   Gastritis, GERD, pancreatitis, choledocholithiasis, kidney stone, UTI, pyelonephritis, doubt appendicitis given no tenderness in the right lower quadrant   Patient's presentation is most consistent with acute presentation with potential threat to life or bodily function.   PLAN: Labs, urine, CT of the abdomen pelvis obtained from triage.  Patient has a leukocytosis with left shift.  Normal electrolytes, creatinine, LFTs and lipase.  Urine does not appear infected but will add on culture given she states this feels similar to her prior episodes of UTIs, pyelonephritis.  CT scan reviewed and interpreted by myself and  the radiologist and shows uncomplicated acute pancreatitis.  She denies alcohol use.  States she was recently started on tirzepatide .  No other diabetes medications.  She has had a cholecystectomy.  Will give IV fluids, pain and nausea medicine and reassess.   MEDICATIONS GIVEN IN ED: Medications  promethazine  (PHENERGAN ) 25 mg in sodium chloride  0.9 % 50 mL IVPB (25 mg Intravenous Not Given 03/22/24 0522)  0.9 %  sodium chloride  infusion ( Intravenous New Bag/Given 03/22/24 0511)  morphine  (PF) 4 MG/ML injection 4 mg (4 mg Intravenous Given 03/22/24 0202)  ondansetron  (ZOFRAN ) injection 4 mg (4 mg Intravenous Given 03/22/24 0201)  sodium chloride  0.9 % bolus 1,000 mL (0 mLs Intravenous Stopped 03/22/24 0336)  sodium chloride  0.9 % bolus 1,000 mL (0 mLs Intravenous Stopped 03/22/24 0426)  HYDROmorphone  (DILAUDID ) injection 1 mg (1 mg Intravenous Given 03/22/24 0341)  pantoprazole  (PROTONIX ) injection 40 mg (40 mg Intravenous Given 03/22/24 0341)     ED COURSE: Patient reports pain and nausea are worse.  She would like to be admitted to the hospital for pain control, IV hydration, bowel rest.  Will discuss with the hospitalist.  Will give additional medication.   CONSULTS:  Consulted and discussed patient's case with hospitalist, Dr. Lawence.  I have recommended admission and consulting physician agrees and will place admission orders.  Patient (and family if present) agree with this plan.   I reviewed all nursing notes, vitals, pertinent previous records.  All labs, EKGs, imaging ordered have been independently reviewed and interpreted by myself.    OUTSIDE RECORDS REVIEWED: Reviewed recent dermatology and internal medicine notes.       FINAL CLINICAL IMPRESSION(S) / ED DIAGNOSES   Final diagnoses:  Idiopathic acute pancreatitis without infection or necrosis     Rx / DC Orders   ED Discharge Orders     None        Note:  This document was prepared using Dragon voice recognition  software and may include unintentional dictation errors.   Raiven Belizaire, Josette SAILOR, DO 03/22/24 [phone]

## 2024-03-22 NOTE — H&P (Signed)
 History and Physical    Joanna Scott FMW:[id number] DOB: 1975-09-19 DOA: 03/22/2024  DOS: the patient was seen and examined on 03/22/2024  PCP: Leavy Mole, PA-C   Patient coming from: Home  I have personally briefly reviewed patient's old medical records in [facility]  Chief Complaint: Abdominal pain  HPI: Joanna Scott is a pleasant 48 y.o. female with medical history significant for PCOS, hypothyroidism, GERD, DM who presented to ED complaining of abdominal pain that radiates into her back.  Patient stated that her pain was severe enough to come to the emergency room.  She stated pain is 7/10 in intensity, epigastric and right lower quadrant radiating to the back.  She had some nausea no vomitings.  She thought the pain was related to her kidneys.  She has a history of previous cholecystectomy, hysterectomy and bilateral salpingo-oophorectomy.  She denied any fever, chest pain, cough, shortness of breath.  ED Course: Upon arrival to the ED, patient is found to be slightly elevated lipase, CT scan of the abdomen showed uncomplicated pancreatitis.  Normal-appearing kidneys.  Hospitalist service was consulted for evaluation for admission for  acute pancreatitis.  Review of Systems:  ROS  All other systems negative except as noted in the HPI.  Past Medical History:  Diagnosis Date   Anemia    Anemia 04/10/2019   Anxiety    Bilateral polycystic ovarian syndrome 10/20/2012   Diabetes mellitus without complication (HCC)    Family history of adverse reaction to anesthesia    malignant hyperthermia in 2 first cousins    Family history of malignant hyperthermia    GERD (gastroesophageal reflux disease)    Hypothyroidism    Microcytosis 10/04/2019   Obesity affecting pregnancy    PCOS (polycystic ovarian syndrome)    Portal vein thrombosis    Postpartum care following cesarean delivery 12/19/2014   Preeclampsia in postpartum period    Pregnancy    Previous cesarean delivery,  delivered 12/16/2014   Previous cesarean section    Prothrombin gene mutation (HCC)    S/P cesarean section 12/17/2014   Scalp psoriasis    Sleep apnea     Past Surgical History:  Procedure Laterality Date   CESAREAN SECTION     CESAREAN SECTION N/A 12/17/2014   Procedure: CESAREAN SECTION;  Surgeon: Garnette JONETTA Mace, MD;  Location: [facility] ORS;  Service: Obstetrics;  Laterality: N/A;   CYSTOSCOPY  06/26/2019   Procedure: CYSTOSCOPY;  Surgeon: Ward, Mitzie BROCKS, MD;  Location: [facility] ORS;  Service: Gynecology;;   CYSTOSCOPY WITH STENT PLACEMENT Right 06/26/2019   Procedure: CYSTOSCOPY WITH STENT PLACEMENT, Cystotomy;  Surgeon: Ward, Mitzie BROCKS, MD;  Location: [facility] ORS;  Service: Gynecology;  Laterality: Right;   DIAGNOSTIC LAPAROSCOPY     DILATION AND CURETTAGE OF UTERUS     LAPAROSCOPIC CHOLECYSTECTOMY     Lynch syndrome     ROBOTIC ASSISTED TOTAL HYSTERECTOMY WITH BILATERAL SALPINGO OOPHERECTOMY Bilateral 06/26/2019   Procedure: XI ROBOTIC ASSISTED TOTAL HYSTERECTOMY WITH BILATERAL SALPINGO OOPHORECTOMY;  Surgeon: Ward, Mitzie BROCKS, MD;  Location: [facility] ORS;  Service: Gynecology;  Laterality: Bilateral;     reports that she has quit smoking. Her smoking use included cigarettes. She has a 5 pack-year smoking history. She has never used smokeless tobacco. She reports current alcohol use. She reports that she does not use drugs.  Allergies  Allergen Reactions   Peach [Prunus Persica] Anaphylaxis and Nausea And Vomiting   Metformin  And Related Other (See Comments)    Severe GI SE and  intolerance   Betula Alba Oil Itching    Sneezing   Vitex Itching    Sneezing and itchy eyes  Birch and Oak    Family History  Problem Relation Age of Onset   Hypertension Mother    Depression Mother    COPD Mother    Ovarian cancer Sister    Deep vein thrombosis Maternal Grandmother    Stroke Maternal Grandfather    Heart attack Maternal Grandfather    Malignant hyperthermia Cousin    Encopresis Son     Malignant hyperthermia Other    Breast cancer Neg Hx     Prior to Admission medications   Medication Sig Start Date End Date Taking? Authorizing Provider  aspirin  EC 81 MG tablet Take 81 mg by mouth daily.   Yes [provider]  atorvastatin  (LIPITOR) 20 MG tablet TAKE 1 TABLET BY MOUTH EVERYDAY AT BEDTIME 11/18/23  Yes Tapia, Leisa, PA-C  cetirizine  (ZYRTEC ) 10 MG tablet Take 1 tablet (10 mg total) by mouth daily as needed for allergies. 04/27/22  Yes Leavy Mole, PA-C  EPINEPHrine  0.3 mg/0.3 mL IJ SOAJ injection Inject 0.3 mg into the muscle as needed for anaphylaxis. 06/22/22  Yes Tapia, Leisa, PA-C  Estradiol  10 MCG TABS vaginal tablet Place 1 tablet (10 mcg total) vaginally 2 (two) times a week. 10/13/23  Yes Tapia, Leisa, PA-C  hydrocortisone 2.5 % cream Apply 1 Application topically as needed. 08/23/22  Yes [provider]  ketoconazole  (NIZORAL ) 2 % shampoo APPLY 1 APPLICATION TOPICALLY 2 (TWO) TIMES A WEEK. 04/24/21  Yes Tapia, Leisa, PA-C  nystatin (MYCOSTATIN/NYSTOP) powder Apply 1 Application topically daily. 12/14/23  Yes [provider]  pantoprazole  (PROTONIX ) 40 MG tablet TAKE 1 TABLET BY MOUTH EVERY DAY IN THE MORNING 11/18/23  Yes Tapia, Leisa, PA-C  tirzepatide  (MOUNJARO ) 12.5 MG/0.5ML Pen Inject 12.5 mg into the skin once a week. 11/15/23  Yes Tapia, Leisa, PA-C  vortioxetine  HBr (TRINTELLIX ) 20 MG TABS tablet Take 1 tablet (20 mg total) by mouth daily. 11/18/23  Yes Tapia, Leisa, PA-C  Continuous Glucose Sensor (DEXCOM G7 SENSOR) MISC 1 DEVICE BY DOES NOT APPLY ROUTE CONTINUOUS FOR 10 DAYS. APPLY DEVICE AND WEAR FOR 10 DAYS CONTINUOUSLY FOR GLUCOSE MONITORING 03/15/24 03/25/24  Tapia, Leisa, PA-C  CONTOUR NEXT TEST test strip 3 (three) times daily. 06/25/22   [provider]    Physical Exam: Vitals:   03/22/24 0800 03/22/24 0900 03/22/24 0930 03/22/24 0938  BP: 136/78 (!) 142/76 126/82   Pulse: 67 60 (!) 56 60  Resp: 12 16 12 13   Temp:       TempSrc:      SpO2: 100% 100% 99% 100%  Weight:      Height:        Physical Exam   Constitutional: Alert, awake, calm, comfortable HEENT: Neck supple Respiratory: Clear to auscultation B/L, no wheezing, no rales.  Cardiovascular: Regular rate and rhythm, no murmurs / rubs / gallops. No extremity edema. 2+ pedal pulses. No carotid bruits.  Abdomen: Soft, no tenderness, Bowel sounds positive.  Musculoskeletal: no clubbing / cyanosis. Good ROM, no contractures. Normal muscle tone.  Skin: no rashes, lesions, ulcers. Neurologic: CN 2-12 grossly intact. Sensation intact, No focal deficit identified Psychiatric: Alert and oriented x 3. Normal mood.    Labs on Admission: I have personally reviewed following labs and imaging studies  CBC: Recent Labs  Lab 03/21/24 2229  WBC 12.0*  NEUTROABS 8.3*  HGB 12.8  HCT 38.6  MCV  83.4  PLT 245   Basic Metabolic Panel: Recent Labs  Lab 03/21/24 2229  NA 139  K 4.0  CL 103  CO2 25  GLUCOSE 98  BUN 19  CREATININE 0.99  CALCIUM  9.3   GFR: Estimated Creatinine Clearance: 82.6 mL/min (by C-G formula based on SCr of 0.99 mg/dL). Liver Function Tests: Recent Labs  Lab 03/21/24 2229  AST 19  ALT 25  ALKPHOS 64  BILITOT 0.7  PROT 7.2  ALBUMIN 4.2   Recent Labs  Lab 03/21/24 2229  LIPASE 50   No results for input(s): AMMONIA in the last 168 hours. Coagulation Profile: No results for input(s): INR, PROTIME in the last 168 hours. Cardiac Enzymes: No results for input(s): CKTOTAL, CKMB, CKMBINDEX, TROPONINI, TROPONINIHS in the last 168 hours. BNP (last 3 results) No results for input(s): BNP in the last 8760 hours. HbA1C: No results for input(s): HGBA1C in the last 72 hours. CBG: No results for input(s): GLUCAP in the last 168 hours. Lipid Profile: No results for input(s): CHOL, HDL, LDLCALC, TRIG, CHOLHDL, LDLDIRECT in the last 72 hours. Thyroid  Function Tests: No results for input(s):  TSH, T4TOTAL, FREET4, T3FREE, THYROIDAB in the last 72 hours. Anemia Panel: No results for input(s): VITAMINB12, FOLATE, FERRITIN, TIBC, IRON, RETICCTPCT in the last 72 hours. Urine analysis:    Component Value Date/Time   COLORURINE STRAW (A) 03/21/2024 2229   APPEARANCEUR CLEAR (A) 03/21/2024 2229   APPEARANCEUR Cloudy (A) 08/08/2019 1024   LABSPEC 1.008 03/21/2024 2229   LABSPEC 1.025 09/04/2012 0056   PHURINE 5.0 03/21/2024 2229   GLUCOSEU NEGATIVE 03/21/2024 2229   GLUCOSEU Negative 09/04/2012 0056   HGBUR NEGATIVE 03/21/2024 2229   BILIRUBINUR NEGATIVE 03/21/2024 2229   BILIRUBINUR Negative 08/08/2019 1024   BILIRUBINUR Negative 09/04/2012 0056   KETONESUR NEGATIVE 03/21/2024 2229   PROTEINUR NEGATIVE 03/21/2024 2229   NITRITE NEGATIVE 03/21/2024 2229   LEUKOCYTESUR NEGATIVE 03/21/2024 2229   LEUKOCYTESUR Trace 09/04/2012 0056    Radiological Exams on Admission: I have personally reviewed images CT Renal Stone Study Result Date: 03/22/2024 EXAM: CT UROGRAM 03/22/2024 12:05:58 AM TECHNIQUE: CT of the abdomen and pelvis was performed before and after the administration of intravenous contrast as per CT urogram protocol. Multiplanar reformatted images as well as MIP urogram images are provided for review. Automated exposure control, iterative reconstruction, and/or weight based adjustment of the mA/kV was utilized to reduce the radiation dose to as low as reasonably achievable. COMPARISON: None available. CLINICAL HISTORY: Abdominal/flank pain, stone suspected. Pt reports lower back pain that began last night, pt denies dysuria but states that she has had a kidney infection in the past and the symptoms feel the same now as they did then. FINDINGS: LOWER CHEST: No acute abnormality. LIVER: The liver is unremarkable. GALLBLADDER AND BILE DUCTS: Cholecystectomy. No biliary ductal dilatation. SPLEEN: No acute abnormality. PANCREAS: Hazy stranding about the pancreatic  head with trace fluid compatible with acute pancreatitis. No organized fluid collection. No pancreatic ductal dilation. ADRENAL GLANDS: No acute abnormality. KIDNEYS, URETERS AND BLADDER: Punctate nonobstructing stone in the left kidney. No hydronephrosis. No perinephric or periureteral stranding. Urinary bladder is unremarkable. GI AND BOWEL: Stomach demonstrates no acute abnormality. There is no bowel obstruction. PERITONEUM AND RETROPERITONEUM: No ascites. No free air. VASCULATURE: Aortic atherosclerotic calcification. LYMPH NODES: No lymphadenopathy. REPRODUCTIVE ORGANS: No acute abnormality. BONES AND SOFT TISSUES: No acute osseous abnormality. No focal soft tissue abnormality. IMPRESSION: 1. Acute uncomplicated pancreatitis. Electronically signed by: Norman Gatlin MD 03/22/2024 12:16 AM  EDT RP Workstation: [id number]    EKG: None available    Assessment/Plan Principal Problem:   Acute pancreatitis Active Problems:   Hyperlipidemia   Major depressive disorder with current active episode   Hypothyroidism   Metabolic syndrome   OSA on CPAP   Type 2 diabetes mellitus without complication, without long-term current use of insulin  (HCC)    Assessment and Plan: This is a 48 year old female who came in for abdominal pain and found to have slightly elevated lipase and uncomplicated pancreatitis on CT scan.  She will be placed in observation for uncomplicated pancreatitis.  1.  Acute pancreatitis - Lipase is very mildly elevated, patient complains of pain mainly on the right side. - CT scan of the abdomen showed uncomplicated pancreatitis - It looks like it is idiopathic pancreatitis. - She does not drink alcohol that often, there is no LFT abnormal.  There is 1 medication she takes could be associated with pancreatitis but very rare. It's Vortioxetine . - She will be n.p.o., IV fluid, pain medications - Will check lipid panels and ultrasound of gallbladder  2.  Type 2 diabetes - Will  place her on insulin  sliding scale - Will hold off for oral hypoglycemic medications  3.  Hyperlipidemia - Will hold off Lipitor at this point due to pancreatitis  4.  GERD - Will give her Protonix  IV  5.  Obstructive sleep apnea on CPAP - She will continue her home CPAP    DVT prophylaxis: Lovenox  Code Status: Full Code Family Communication: None  Disposition Plan: Home  Consults called: None  Admission status: Observation, Med-Surg   Nena Rebel, MD [facility] 03/22/2024, 10:36 AM

## 2024-03-23 DIAGNOSIS — K859 Acute pancreatitis without necrosis or infection, unspecified: Secondary | ICD-10-CM | POA: Diagnosis not present

## 2024-03-23 LAB — CBC
HCT: 34.3 % — ABNORMAL LOW (ref 36.0–46.0)
Hemoglobin: 11.5 g/dL — ABNORMAL LOW (ref 12.0–15.0)
MCH: 27.7 pg (ref 26.0–34.0)
MCHC: 33.5 g/dL (ref 30.0–36.0)
MCV: 82.7 fL (ref 80.0–100.0)
Platelets: 186 K/uL (ref 150–400)
RBC: 4.15 MIL/uL (ref 3.87–5.11)
RDW: 13.5 % (ref 11.5–15.5)
WBC: 8.4 K/uL (ref 4.0–10.5)
nRBC: 0 % (ref 0.0–0.2)

## 2024-03-23 LAB — COMPREHENSIVE METABOLIC PANEL WITH GFR
ALT: 151 U/L — ABNORMAL HIGH (ref 0–44)
AST: 87 U/L — ABNORMAL HIGH (ref 15–41)
Albumin: 3.5 g/dL (ref 3.5–5.0)
Alkaline Phosphatase: 81 U/L (ref 38–126)
Anion gap: 7 (ref 5–15)
BUN: 11 mg/dL (ref 6–20)
CO2: 26 mmol/L (ref 22–32)
Calcium: 8.6 mg/dL — ABNORMAL LOW (ref 8.9–10.3)
Chloride: 107 mmol/L (ref 98–111)
Creatinine, Ser: 0.78 mg/dL (ref 0.44–1.00)
GFR, Estimated: >60 mL/min (ref >60)
Glucose, Bld: 83 mg/dL (ref 70–99)
Potassium: 3.7 mmol/L (ref 3.5–5.1)
Sodium: 140 mmol/L (ref 135–145)
Total Bilirubin: 0.9 mg/dL (ref 0.0–1.2)
Total Protein: 6.3 g/dL — ABNORMAL LOW (ref 6.5–8.1)

## 2024-03-23 LAB — LIPID PANEL
Cholesterol: 122 mg/dL (ref 0–200)
HDL: 31 mg/dL — ABNORMAL LOW (ref >40)
LDL Cholesterol: 73 mg/dL (ref 0–99)
Total CHOL/HDL Ratio: 3.9 ratio
Triglycerides: 88 mg/dL (ref <150)
VLDL: 18 mg/dL (ref 0–40)

## 2024-03-23 LAB — GLUCOSE, CAPILLARY
Glucose-Capillary: 117 mg/dL — ABNORMAL HIGH (ref 70–99)
Glucose-Capillary: 84 mg/dL (ref 70–99)
Glucose-Capillary: 88 mg/dL (ref 70–99)
Glucose-Capillary: 89 mg/dL (ref 70–99)

## 2024-03-23 LAB — URINE CULTURE: Culture: <10000 — AB

## 2024-03-23 LAB — LIPASE, BLOOD: Lipase: 33 U/L (ref 11–51)

## 2024-03-23 MED ORDER — INFLUENZA VIRUS VACC SPLIT PF (FLUZONE) 0.5 ML IM SUSY: 0.5000 mL | PREFILLED_SYRINGE | INTRAMUSCULAR | Status: DC

## 2024-03-23 MED ORDER — PANTOPRAZOLE SODIUM 40 MG PO TBEC
40.0000 mg | DELAYED_RELEASE_TABLET | Freq: Two times a day (BID) | ORAL | Status: DC
  Administered 2024-03-23 – 2024-03-24 (×3): 40 mg via ORAL
  Filled 2024-03-23 (×3): qty 1

## 2024-03-23 MED ORDER — VORTIOXETINE HBR 5 MG PO TABS
20.0000 mg | ORAL_TABLET | Freq: Every day | ORAL | Status: DC
  Administered 2024-03-23 – 2024-03-24 (×2): 20 mg via ORAL
  Filled 2024-03-23 (×2): qty 4

## 2024-03-23 NOTE — TOC CM/SW Note (Signed)
 Transition of Care [facility]) - Inpatient Brief Assessment   Patient Details  Name: Joanna Scott MRN: [id number] Date of Birth: 24-Jul-1975  Transition of [facility]) CM/SW Contact:    Corean ONEIDA Haddock, RN Phone Number: 03/23/2024, 11:03 AM   Clinical Narrative:   Transition of Care (TOC) Screening Note   Patient Details  Name: Joanna Scott Date of Birth: [birth year]   Transition of Care [facility]) CM/SW Contact:    Corean ONEIDA Haddock, RN Phone Number: 03/23/2024, 11:03 AM    Transition of Care Department [facility]) has reviewed patient and no TOC needs have been identified at this time. If new patient transition needs arise, please place a TOC consult.    Transition of Care Asessment: Insurance and Status: Insurance coverage has been reviewed Patient has primary care physician: Yes     Prior/Current Home Services: No current home services Social Drivers of Health Review: SDOH reviewed no interventions necessary Readmission risk has been reviewed: Yes Transition of care needs: no transition of care needs at this time

## 2024-03-23 NOTE — Progress Notes (Signed)
 Progress Note    Joanna Scott  FMW:[id number] DOB: March 10, 1976  DOA: 03/22/2024 PCP: Leavy Mole, PA-C      Brief Narrative:    Medical records reviewed and are as summarized below:  Joanna Scott is a 48 y.o. female with medical history significant for PCOS, hypothyroidism, GERD, DM, class II obesity, who presented to the ED with severe upper abdominal pain that radiates to the back.  Pain was associated with nausea but no vomiting.       Assessment/Plan:   Principal Problem:   Acute pancreatitis Active Problems:   Hyperlipidemia   Major depressive disorder with current active episode   Hypothyroidism   Metabolic syndrome   OSA on CPAP   Type 2 diabetes mellitus without complication, without long-term current use of insulin  (HCC)    Body mass index is 39.48 kg/m.  (Class II obesity)   Acute pancreatitis: Start clear liquid diet and advance diet as tolerated.  Analgesics as needed.  Discontinue IV fluids. She was on tirzepatide  prior to admission.  Discussed risks and benefits of tirzepatide .  Pancreatitis is one of the rare side effects of this medication.  Patient has decided to discontinue tirzepatide  and she will discuss other options with her primary care physician.   Elevated liver enzymes: This is probably related to pancreatitis. She is s/p cholecystectomy.  No cholelithiasis or choledocholithiasis. Increased caliber of proximal common bile duct which measures 9 mm.  No intrahepatic bile duct dilatation.  Liver is unremarkable on CT abdomen and pelvis. Patient said she was told she had a history of fatty liver in the past.   Depression: Continue vortioxetine .  She said she cannot go for days without vortioxetine  and prefers to continue this medication.    Comorbidities include type II DM, hyperlipidemia, metabolic syndrome    Diet Order             Diet NPO time specified  Diet effective now                        None           Consultants: None  Procedures: None    Medications:    aspirin  EC  81 mg Oral Daily   enoxaparin  (LOVENOX ) injection  50 mg Subcutaneous Q24H   [START ON 03/24/2024] influenza vac split trivalent PF  0.5 mL Intramuscular Tomorrow-1000   insulin  aspart  0-9 Units Subcutaneous TID WC   pantoprazole  (PROTONIX ) IV  40 mg Intravenous Q12H   Continuous Infusions:  sodium chloride  125 mL/hr at 03/23/24 0744   promethazine  (PHENERGAN ) injection (IM or IVPB)       Anti-infectives (From admission, onward)    None              Family Communication/Anticipated D/C date and plan/Code Status   DVT prophylaxis: SCDs Start: 03/22/24 1025     Code Status: Full Code  Family Communication: Plan discussed with the husband at the bedside Disposition Plan: Plan to discharge home   Status is: Inpatient Remains inpatient appropriate because: Acute pancreatitis       Subjective:   Interval events noted.  She complains of abdominal pain that radiates to the mid back.  She also has some nausea but no vomiting.  She is willing to try clear liquid diet.  Husband and children at the bedside  Objective:    Vitals:   03/22/24 2300 03/23/24 0012 03/23/24 0055 03/23/24 0810  BP:  133/78 ROLLEN)  140/67 118/73  Pulse: 63 85 82 72  Resp:  17 16 16   Temp:  98.5 F (36.9 C) 98.4 F (36.9 C) 97.9 F (36.6 C)  TempSrc:   Oral Oral  SpO2: 97% 97% 98% 97%  Weight:      Height:       No data found.   Intake/Output Summary (Last 24 hours) at 03/23/2024 1009 Last data filed at 03/23/2024 0441 Gross per 24 hour  Intake 2867.79 ml  Output --  Net 2867.79 ml   Filed Weights   03/21/24 2224  Weight: 104.3 kg    Exam:  GEN: NAD SKIN: Warm and dry EYES: No pallor or icterus ENT: MMM CV: RRR PULM: CTA B ABD: soft, obese, epigastric tenderness, no rebound tenderness, +BS CNS: AAO x 3, non focal EXT: No edema or tenderness        Data Reviewed:   I have personally reviewed following labs and imaging studies:  Labs: Labs show the following:   Basic Metabolic Panel: Recent Labs  Lab 03/21/24 2229 03/23/24 0323  NA 139 140  K 4.0 3.7  CL 103 107  CO2 25 26  GLUCOSE 98 83  BUN 19 11  CREATININE 0.99 0.78  CALCIUM  9.3 8.6*   GFR Estimated Creatinine Clearance: 102.2 mL/min (by C-G formula based on SCr of 0.78 mg/dL). Liver Function Tests: Recent Labs  Lab 03/21/24 2229 03/23/24 0323  AST 19 87*  ALT 25 151*  ALKPHOS 64 81  BILITOT 0.7 0.9  PROT 7.2 6.3*  ALBUMIN 4.2 3.5   Recent Labs  Lab 03/21/24 2229  LIPASE 50   No results for input(s): AMMONIA in the last 168 hours. Coagulation profile No results for input(s): INR, PROTIME in the last 168 hours.  CBC: Recent Labs  Lab 03/21/24 2229 03/23/24 0323  WBC 12.0* 8.4  NEUTROABS 8.3*  --   HGB 12.8 11.5*  HCT 38.6 34.3*  MCV 83.4 82.7  PLT 245 186   Cardiac Enzymes: No results for input(s): CKTOTAL, CKMB, CKMBINDEX, TROPONINI in the last 168 hours. BNP (last 3 results) No results for input(s): PROBNP in the last 8760 hours. CBG: Recent Labs  Lab 03/22/24 1113 03/22/24 1612 03/22/24 2212 03/23/24 0048 03/23/24 0737  GLUCAP 77 82 80 84 88   D-Dimer: No results for input(s): DDIMER in the last 72 hours. Hgb A1c: No results for input(s): HGBA1C in the last 72 hours. Lipid Profile: Recent Labs    03/23/24 0323  CHOL 122  HDL 31*  LDLCALC 73  TRIG 88  CHOLHDL 3.9   Thyroid  function studies: No results for input(s): TSH, T4TOTAL, T3FREE, THYROIDAB in the last 72 hours.  Invalid input(s): FREET3 Anemia work up: No results for input(s): VITAMINB12, FOLATE, FERRITIN, TIBC, IRON, RETICCTPCT in the last 72 hours. Sepsis Labs: Recent Labs  Lab 03/21/24 2229 03/23/24 0323  WBC 12.0* 8.4    Microbiology Recent Results (from the past 240 hours)  Urine Culture     Status:  Abnormal   Collection Time: 03/21/24 10:29 PM   Specimen: Urine, Clean Catch  Result Value Ref Range Status   Specimen Description   Final    URINE, CLEAN CATCH Performed at [facility], [address] [place], [place] [address]    Special Requests   Final    NONE Performed at [facility], [address] [place], [place] [address]    Culture (A)  Final    <10,000 COLONIES/mL INSIGNIFICANT GROWTH Performed at  [facility], [address], [place], [place] [address]    Report Status 03/23/2024 FINAL  Final    Procedures and diagnostic studies:  US  Abdomen Limited RUQ (LIVER/GB) Result Date: 03/22/2024 EXAM: Right Upper Quadrant Abdominal Ultrasound TECHNIQUE: Real-time ultrasonography of the right upper quadrant of the abdomen was performed. COMPARISON: CT abdomen and pelvis from 03/22/2024. CLINICAL HISTORY: Acute pancreatitis. FINDINGS: LIVER: The liver demonstrates normal echogenicity. No intrahepatic biliary ductal dilatation. No mass. Portal vein is visualized and appears patent. BILIARY SYSTEM: Status post cholecystectomy. The proximal common bile duct measures 9 mm. RIGHT KIDNEY: The visualized portions of the right kidney are unremarkable. PANCREAS: Visualized portions of the pancreas are unremarkable. OTHER: No right upper quadrant ascites. IMPRESSION: 1. Status post cholecystectomy. 2. Increased caliber of the proximal common bile duct which measures 9 mm. No intrahepatic bile duct dilatation. Electronically signed by: Waddell Calk MD 03/22/2024 12:52 PM EDT RP Workstation: [id number]   CT Renal Stone Study Result Date: 03/22/2024 EXAM: CT UROGRAM 03/22/2024 12:05:58 AM TECHNIQUE: CT of the abdomen and pelvis was performed before and after the administration of intravenous contrast as per CT urogram protocol. Multiplanar reformatted images as well as MIP urogram images are provided for review. Automated exposure control, iterative reconstruction, and/or  weight based adjustment of the mA/kV was utilized to reduce the radiation dose to as low as reasonably achievable. COMPARISON: None available. CLINICAL HISTORY: Abdominal/flank pain, stone suspected. Pt reports lower back pain that began last night, pt denies dysuria but states that she has had a kidney infection in the past and the symptoms feel the same now as they did then. FINDINGS: LOWER CHEST: No acute abnormality. LIVER: The liver is unremarkable. GALLBLADDER AND BILE DUCTS: Cholecystectomy. No biliary ductal dilatation. SPLEEN: No acute abnormality. PANCREAS: Hazy stranding about the pancreatic head with trace fluid compatible with acute pancreatitis. No organized fluid collection. No pancreatic ductal dilation. ADRENAL GLANDS: No acute abnormality. KIDNEYS, URETERS AND BLADDER: Punctate nonobstructing stone in the left kidney. No hydronephrosis. No perinephric or periureteral stranding. Urinary bladder is unremarkable. GI AND BOWEL: Stomach demonstrates no acute abnormality. There is no bowel obstruction. PERITONEUM AND RETROPERITONEUM: No ascites. No free air. VASCULATURE: Aortic atherosclerotic calcification. LYMPH NODES: No lymphadenopathy. REPRODUCTIVE ORGANS: No acute abnormality. BONES AND SOFT TISSUES: No acute osseous abnormality. No focal soft tissue abnormality. IMPRESSION: 1. Acute uncomplicated pancreatitis. Electronically signed by: Norman Gatlin MD 03/22/2024 12:16 AM EDT RP Workstation: [id number]               LOS: 1 day   Georgi Navarrete  [facility]   Pager on [email or web address]. If 7PM-7AM, please contact night-coverage at [email or web address]     03/23/2024, 10:09 AM

## 2024-03-24 DIAGNOSIS — K859 Acute pancreatitis without necrosis or infection, unspecified: Secondary | ICD-10-CM | POA: Diagnosis not present

## 2024-03-24 LAB — GLUCOSE, CAPILLARY
Glucose-Capillary: 87 mg/dL (ref 70–99)
Glucose-Capillary: 141 mg/dL — ABNORMAL HIGH (ref 70–99)
Glucose-Capillary: 86 mg/dL (ref 70–99)

## 2024-03-24 LAB — COMPREHENSIVE METABOLIC PANEL WITH GFR
ALT: 87 U/L — ABNORMAL HIGH (ref 0–44)
AST: 36 U/L (ref 15–41)
Albumin: 3.6 g/dL (ref 3.5–5.0)
Alkaline Phosphatase: 75 U/L (ref 38–126)
Anion gap: 11 (ref 5–15)
BUN: 10 mg/dL (ref 6–20)
CO2: 24 mmol/L (ref 22–32)
Calcium: 9 mg/dL (ref 8.9–10.3)
Chloride: 103 mmol/L (ref 98–111)
Creatinine, Ser: 0.77 mg/dL (ref 0.44–1.00)
GFR, Estimated: >60 mL/min (ref >60)
Glucose, Bld: 140 mg/dL — ABNORMAL HIGH (ref 70–99)
Potassium: 3.3 mmol/L — ABNORMAL LOW (ref 3.5–5.1)
Sodium: 138 mmol/L (ref 135–145)
Total Bilirubin: 0.7 mg/dL (ref 0.0–1.2)
Total Protein: 6.9 g/dL (ref 6.5–8.1)

## 2024-03-24 MED ORDER — POTASSIUM CHLORIDE CRYS ER 20 MEQ PO TBCR
40.0000 meq | EXTENDED_RELEASE_TABLET | Freq: Once | ORAL | Status: AC
  Administered 2024-03-24: 40 meq via ORAL
  Filled 2024-03-24: qty 2

## 2024-03-24 NOTE — Discharge Summary (Addendum)
 Physician Discharge Summary   Patient: Joanna Scott MRN: [id number] DOB: May 19, 1976  Admit date:     03/22/2024  Discharge date: 03/24/24  Discharge Physician: AIDA CHO   PCP: Leavy Mole, PA-C   Recommendations at discharge:   Follow-up with PCP in 1 to 2 weeks  Discharge Diagnoses: Principal Problem:   Acute pancreatitis Active Problems:   Hyperlipidemia   Major depressive disorder with current active episode   Hypothyroidism   Metabolic syndrome   OSA on CPAP   Type 2 diabetes mellitus without complication, without long-term current use of insulin  (HCC)  Resolved Problems:   * No resolved hospital problems. *  Hospital Course:  Joanna Scott is a 48 y.o. female with medical history significant for PCOS, hypothyroidism, GERD, DM, class II obesity, who presented to the ED with severe upper abdominal pain that radiates to the back.  Pain was associated with nausea but no vomiting.     Assessment and Plan:   Acute pancreatitis: Improved.  She tolerated regular diet.  Unable to prove whether pancreatitis was due to tirzepatide .  She  has decided to discontinue tirzepatide  and she will discuss other options with her primary care physician.     Elevated liver enzymes: Improved.  This is probably related to pancreatitis. She is s/p cholecystectomy.  No cholelithiasis or choledocholithiasis. Increased caliber of proximal common bile duct which measures 9 mm.  No intrahepatic bile duct dilatation.  Liver is unremarkable on CT abdomen and pelvis. Patient said she was told she had a history of fatty liver in the past.     Hypokalemia: Potassium was 3.3.  This was supplemented with oral potassium chloride .   Depression: Continue vortioxetine .       Comorbidities include type II DM, hyperlipidemia, metabolic syndrome         Consultants: None Procedures performed: None Disposition: Home Diet recommendation:  Discharge Diet Orders (From admission, onward)      Start     Ordered   03/24/24 0000  Diet - low sodium heart healthy        03/24/24 1207   03/24/24 0000  Diet Carb Modified        03/24/24 1207           Cardiac and Carb modified diet DISCHARGE MEDICATION: Allergies as of 03/24/2024       Reactions   Peach [prunus Persica] Anaphylaxis, Nausea And Vomiting   Metformin  And Related Other (See Comments)   Severe GI SE and intolerance   Betula Alba Oil Itching   Sneezing   Vitex Itching   Sneezing and itchy eyes  Valrie and Oak        Medication List     TAKE these medications    aspirin  EC 81 MG tablet Take 81 mg by mouth daily.   atorvastatin  20 MG tablet Commonly known as: LIPITOR TAKE 1 TABLET BY MOUTH EVERYDAY AT BEDTIME   cetirizine  10 MG tablet Commonly known as: ZYRTEC  Take 1 tablet (10 mg total) by mouth daily as needed for allergies.   Contour Next Test test strip Generic drug: glucose blood 3 (three) times daily.   Dexcom G7 Sensor Misc 1 DEVICE BY DOES NOT APPLY ROUTE CONTINUOUS FOR 10 DAYS. APPLY DEVICE AND WEAR FOR 10 DAYS CONTINUOUSLY FOR GLUCOSE MONITORING   EPINEPHrine  0.3 mg/0.3 mL Soaj injection Commonly known as: EPI-PEN Inject 0.3 mg into the muscle as needed for anaphylaxis.   Estradiol  10 MCG Tabs vaginal tablet Place 1 tablet (10 mcg  total) vaginally 2 (two) times a week.   hydrocortisone 2.5 % cream Apply 1 Application topically as needed.   ketoconazole  2 % shampoo Commonly known as: NIZORAL  APPLY 1 APPLICATION TOPICALLY 2 (TWO) TIMES A WEEK.   nystatin powder Commonly known as: MYCOSTATIN/NYSTOP Apply 1 Application topically daily.   pantoprazole  40 MG tablet Commonly known as: PROTONIX  TAKE 1 TABLET BY MOUTH EVERY DAY IN THE MORNING   vortioxetine  HBr 20 MG Tabs tablet Commonly known as: Trintellix  Take 1 tablet (20 mg total) by mouth daily.        Discharge Exam: Filed Weights   03/21/24 2224  Weight: 104.3 kg   GEN: NAD SKIN: Warm and dry EYES:  Anicteric ENT: MMM CV: RRR PULM: CTA B ABD: soft, obese, NT, +BS CNS: AAO x 3, non focal EXT: No edema or tenderness  Condition at discharge: good  The results of significant diagnostics from this hospitalization (including imaging, microbiology, ancillary and laboratory) are listed below for reference.   Imaging Studies: US  Abdomen Limited RUQ (LIVER/GB) Result Date: 03/22/2024 EXAM: Right Upper Quadrant Abdominal Ultrasound TECHNIQUE: Real-time ultrasonography of the right upper quadrant of the abdomen was performed. COMPARISON: CT abdomen and pelvis from 03/22/2024. CLINICAL HISTORY: Acute pancreatitis. FINDINGS: LIVER: The liver demonstrates normal echogenicity. No intrahepatic biliary ductal dilatation. No mass. Portal vein is visualized and appears patent. BILIARY SYSTEM: Status post cholecystectomy. The proximal common bile duct measures 9 mm. RIGHT KIDNEY: The visualized portions of the right kidney are unremarkable. PANCREAS: Visualized portions of the pancreas are unremarkable. OTHER: No right upper quadrant ascites. IMPRESSION: 1. Status post cholecystectomy. 2. Increased caliber of the proximal common bile duct which measures 9 mm. No intrahepatic bile duct dilatation. Electronically signed by: Waddell Calk MD 03/22/2024 12:52 PM EDT RP Workstation: [id number]   CT Renal Stone Study Result Date: 03/22/2024 EXAM: CT UROGRAM 03/22/2024 12:05:58 AM TECHNIQUE: CT of the abdomen and pelvis was performed before and after the administration of intravenous contrast as per CT urogram protocol. Multiplanar reformatted images as well as MIP urogram images are provided for review. Automated exposure control, iterative reconstruction, and/or weight based adjustment of the mA/kV was utilized to reduce the radiation dose to as low as reasonably achievable. COMPARISON: None available. CLINICAL HISTORY: Abdominal/flank pain, stone suspected. Pt reports lower back pain that began last night, pt denies  dysuria but states that she has had a kidney infection in the past and the symptoms feel the same now as they did then. FINDINGS: LOWER CHEST: No acute abnormality. LIVER: The liver is unremarkable. GALLBLADDER AND BILE DUCTS: Cholecystectomy. No biliary ductal dilatation. SPLEEN: No acute abnormality. PANCREAS: Hazy stranding about the pancreatic head with trace fluid compatible with acute pancreatitis. No organized fluid collection. No pancreatic ductal dilation. ADRENAL GLANDS: No acute abnormality. KIDNEYS, URETERS AND BLADDER: Punctate nonobstructing stone in the left kidney. No hydronephrosis. No perinephric or periureteral stranding. Urinary bladder is unremarkable. GI AND BOWEL: Stomach demonstrates no acute abnormality. There is no bowel obstruction. PERITONEUM AND RETROPERITONEUM: No ascites. No free air. VASCULATURE: Aortic atherosclerotic calcification. LYMPH NODES: No lymphadenopathy. REPRODUCTIVE ORGANS: No acute abnormality. BONES AND SOFT TISSUES: No acute osseous abnormality. No focal soft tissue abnormality. IMPRESSION: 1. Acute uncomplicated pancreatitis. Electronically signed by: Norman Gatlin MD 03/22/2024 12:16 AM EDT RP Workstation: [id number]    Microbiology: Results for orders placed or performed during the hospital encounter of 03/22/24  Urine Culture     Status: Abnormal   Collection Time: 03/21/24 10:29 PM   Specimen:  Urine, Clean Catch  Result Value Ref Range Status   Specimen Description   Final    URINE, CLEAN CATCH Performed at [facility], [address], [place], [place] [address]    Special Requests   Final    NONE Performed at [facility], [address] [place], [place] [address]    Culture (A)  Final    <10,000 COLONIES/mL INSIGNIFICANT GROWTH Performed at [facility], [address] [place], [place] [address]    Report Status 03/23/2024 FINAL  Final    Labs: CBC: Recent Labs  Lab 03/21/24 2229 03/23/24 0323  WBC  12.0* 8.4  NEUTROABS 8.3*  --   HGB 12.8 11.5*  HCT 38.6 34.3*  MCV 83.4 82.7  PLT 245 186   Basic Metabolic Panel: Recent Labs  Lab 03/21/24 2229 03/23/24 0323 03/24/24 1002  NA 139 140 138  K 4.0 3.7 3.3*  CL 103 107 103  CO2 25 26 24   GLUCOSE 98 83 140*  BUN 19 11 10   CREATININE 0.99 0.78 0.77  CALCIUM  9.3 8.6* 9.0   Liver Function Tests: Recent Labs  Lab 03/21/24 2229 03/23/24 0323 03/24/24 1002  AST 19 87* 36  ALT 25 151* 87*  ALKPHOS 64 81 75  BILITOT 0.7 0.9 0.7  PROT 7.2 6.3* 6.9  ALBUMIN 4.2 3.5 3.6   CBG: Recent Labs  Lab 03/23/24 1227 03/23/24 1726 03/24/24 0342 03/24/24 0928 03/24/24 1155  GLUCAP 117* 89 87 141* 86    Discharge time spent: greater than 30 minutes.  Signed: AIDA CHO, MD [facility] 03/24/2024

## 2024-03-24 NOTE — Plan of Care (Signed)

## 2024-03-27 ENCOUNTER — Encounter: Payer: Self-pay | Admitting: Family Medicine

## 2024-03-27 ENCOUNTER — Ambulatory Visit: Admitting: Family Medicine

## 2024-03-27 VITALS — BP 118/76 | HR 75 | Resp 16 | Ht 64.0 in | Wt 229.0 lb

## 2024-03-27 DIAGNOSIS — Z90710 Acquired absence of both cervix and uterus: Secondary | ICD-10-CM

## 2024-03-27 DIAGNOSIS — Z1509 Genetic susceptibility to other malignant neoplasm: Secondary | ICD-10-CM

## 2024-03-27 DIAGNOSIS — D6852 Prothrombin gene mutation: Secondary | ICD-10-CM

## 2024-03-27 DIAGNOSIS — Z90722 Acquired absence of ovaries, bilateral: Secondary | ICD-10-CM

## 2024-03-27 DIAGNOSIS — Z09 Encounter for follow-up examination after completed treatment for conditions other than malignant neoplasm: Secondary | ICD-10-CM

## 2024-03-27 DIAGNOSIS — E66812 Obesity, class 2: Secondary | ICD-10-CM

## 2024-03-27 DIAGNOSIS — E876 Hypokalemia: Secondary | ICD-10-CM | POA: Diagnosis not present

## 2024-03-27 DIAGNOSIS — R748 Abnormal levels of other serum enzymes: Secondary | ICD-10-CM | POA: Diagnosis not present

## 2024-03-27 DIAGNOSIS — K859 Acute pancreatitis without necrosis or infection, unspecified: Secondary | ICD-10-CM | POA: Diagnosis not present

## 2024-03-27 DIAGNOSIS — Z6839 Body mass index (BMI) 39.0-39.9, adult: Secondary | ICD-10-CM

## 2024-03-27 DIAGNOSIS — Z9079 Acquired absence of other genital organ(s): Secondary | ICD-10-CM

## 2024-03-27 DIAGNOSIS — E119 Type 2 diabetes mellitus without complications: Secondary | ICD-10-CM

## 2024-03-27 NOTE — Patient Instructions (Signed)
 Encounter Details Date Type Department Care Team (Latest Contact Info) Description  07/23/2022 Orders Only [facility]IC[facility] [place]  [address]  [facility] [address] [place], [place] [address]  [phone]  McGill, Lauraine Sor, MD  [address]  [id number]  Bioinformatics Building  [place], [place] [address]  [phone] (Work)  [phone] (Fax)  Abdominal pain, chronic, generalized (Primary Dx); Chronic abdominal

## 2024-03-27 NOTE — Progress Notes (Signed)
 Patient ID: Joanna Scott, female    DOB: March 21, 1976, 48 y.o.   MRN: [id number]  PCP: Leavy Mole, PA-C  Chief Complaint  Patient presents with   Hospitalization Follow-up   Pancreatitis    Subjective:   Joanna Scott is a 48 y.o. female, presents to clinic with CC of the following:  HPI  Pt here for HFU Admit date:     03/22/2024  Discharge date: 03/24/24  Discharge Physician: AIDA CHO    PCP: Leavy Mole, PA-C    Recommendations at discharge:    Follow-up with PCP in 1 to 2 weeks   Discharge Diagnoses: Principal Problem:   Acute pancreatitis Active Problems:   Hyperlipidemia   Major depressive disorder with current active episode   Hypothyroidism   Metabolic syndrome   OSA on CPAP   Type 2 diabetes mellitus without complication, without long-term current use of insulin  (HCC)   Resolved Problems:   * No resolved hospital problems. *   Hospital Course:   Joanna Scott is a 48 y.o. female with medical history significant for PCOS, hypothyroidism, GERD, DM, class II obesity, who presented to the ED with severe upper abdominal pain that radiates to the back.  Pain was associated with nausea but no vomiting.        Assessment and Plan:     Acute pancreatitis: Improved.  She tolerated regular diet.  Unable to prove whether pancreatitis was due to tirzepatide .  She  has decided to discontinue tirzepatide  and she will discuss other options with her primary care physician.     Elevated liver enzymes: Improved.  This is probably related to pancreatitis. She is s/p cholecystectomy.  No cholelithiasis or choledocholithiasis. Increased caliber of proximal common bile duct which measures 9 mm.  No intrahepatic bile duct dilatation.  Liver is unremarkable on CT abdomen and pelvis. Patient said she was told she had a history of fatty liver in the past.     Hypokalemia: Potassium was 3.3.  This was supplemented with oral potassium chloride .     Depression: Continue  vortioxetine .       Comorbidities include type II DM, hyperlipidemia, metabolic syndrome    US  Abdomen Limited RUQ (LIVER/GB) EXAM: Right Upper Quadrant Abdominal Ultrasound  TECHNIQUE: Real-time ultrasonography of the right upper quadrant of the abdomen was performed.  COMPARISON: CT abdomen and pelvis from 03/22/2024.  CLINICAL HISTORY: Acute pancreatitis.  FINDINGS:  LIVER: The liver demonstrates normal echogenicity. No intrahepatic biliary ductal dilatation. No mass. Portal vein is visualized and appears patent.  BILIARY SYSTEM: Status post cholecystectomy. The proximal common bile duct measures 9 mm.  RIGHT KIDNEY: The visualized portions of the right kidney are unremarkable. PANCREAS: Visualized portions of the pancreas are unremarkable.  OTHER: No right upper quadrant ascites.  IMPRESSION: 1. Status post cholecystectomy. 2. Increased caliber of the proximal common bile duct which measures 9 mm. No intrahepatic bile duct dilatation.  Electronically signed by: Waddell Calk MD 03/22/2024 12:52 PM EDT RP Workstation: [id number] CT Renal Stone Study EXAM: CT UROGRAM 03/22/2024 12:05:58 AM  TECHNIQUE: CT of the abdomen and pelvis was performed before and after the administration of intravenous contrast as per CT urogram protocol. Multiplanar reformatted images as well as MIP urogram images are provided for review. Automated exposure control, iterative reconstruction, and/or weight based adjustment of the mA/kV was utilized to reduce the radiation dose to as low as reasonably achievable.  COMPARISON: None available.  CLINICAL HISTORY: Abdominal/flank pain, stone suspected. Pt reports lower back  pain that began last night, pt denies dysuria but states that she has had a kidney infection in the past and the symptoms feel the same now as they did then.  FINDINGS:  LOWER CHEST: No acute abnormality.  LIVER: The liver is unremarkable.  GALLBLADDER  AND BILE DUCTS: Cholecystectomy. No biliary ductal dilatation.  SPLEEN: No acute abnormality.  PANCREAS: Hazy stranding about the pancreatic head with trace fluid compatible with acute pancreatitis. No organized fluid collection. No pancreatic ductal dilation.  ADRENAL GLANDS: No acute abnormality.  KIDNEYS, URETERS AND BLADDER: Punctate nonobstructing stone in the left kidney. No hydronephrosis. No perinephric or periureteral stranding. Urinary bladder is unremarkable.  GI AND BOWEL: Stomach demonstrates no acute abnormality. There is no bowel obstruction.  PERITONEUM AND RETROPERITONEUM: No ascites. No free air.  VASCULATURE: Aortic atherosclerotic calcification.  LYMPH NODES: No lymphadenopathy.  REPRODUCTIVE ORGANS: No acute abnormality.  BONES AND SOFT TISSUES: No acute osseous abnormality. No focal soft tissue abnormality.  IMPRESSION: 1. Acute uncomplicated pancreatitis.  Electronically signed by: Norman Gatlin MD 03/22/2024 12:16 AM EDT RP Workstation: [id number]  DM managed on mounjaro , off med now since Aug 31 Lab Results  Component Value Date   HGBA1C 5.1 11/15/2023   HGBA1C 5.5 04/29/2023   HGBA1C 6.8 (H) 10/27/2022   HGBA1C 7.4 (H) 04/28/2022   HGBA1C 6.8 (H) 03/27/2021   Discussed the use of AI scribe software for clinical note transcription with the patient, who gave verbal consent to proceed.  History of Present Illness Joanna Scott is a 48 year old female with a history of pancreatitis who presents with abdominal pain.  Abdominal pain - Persistent lower abdominal pain described as feeling like being 'punched in the stomach' - Pain duration of two days prior to seeking emergency care - Pain location in the lower abdomen, initially suspected to be appendicitis, later considered possibly pancreatic in origin - Similar episodes over the past year, occurring approximately five or six times, initially attributed to kidneys - No associated nausea  or vomiting - No alcohol consumption  Pancreatitis and medication concerns - History of pancreatitis with recurrent episodes over the past year - Concern that Trintellix  may contribute to pancreatitis, but emphasizes need for medication for mental health - Recent episode with lipase levels not significantly elevated during hospitalization - Liver enzymes elevated initially during recent hospitalization, now decreased - Received potassium supplementation in hospital for hypokalemia, attributed to not eating or drinking for two and a half days  Hospital course and symptom management - Received morphine  in the emergency room without pain relief - Dilaudid  provided pain relief and allowed sleep - Intravenous fluids initially worsened abdominal pain, causing abdominal tightness - NPO for two days, then started on clear liquids  Diabetes mellitus - History of diabetes with most recent A1c of 5.1 - Received insulin  in the hospital despite not being on it regularly - Actively monitoring blood glucose levels - Noted dietary triggers for hyperglycemia, such as rice  Genetic and thrombotic risk factors - Diagnosed with Lynch syndrome, requiring annual colonoscopies - Prothrombin gene mutation with prior portal vein thrombosis while on birth control      Patient Active Problem List   Diagnosis Date Noted   Acute pancreatitis 03/22/2024   Finger pain, left 04/30/2023   Fatty liver disease, nonalcoholic 12/24/2020   Atherosclerosis 12/24/2020   OSA on CPAP 09/23/2020   Type 2 diabetes mellitus without complication, without long-term current use of insulin  (HCC) 09/23/2020   Lynch syndrome 06/12/2020   Allergy  with anaphylaxis due to food 06/12/2020   Metabolic syndrome 06/12/2020   Iron deficiency anemia 12/14/2019   Hypothyroidism 10/04/2019   Seborrheic dermatitis of scalp 10/04/2019   Class 2 severe obesity with serious comorbidity and body mass index (BMI) of 39.0 to 39.9 in adult  (HCC) 10/04/2019   Prothrombin gene mutation (HCC) 10/04/2019   S/P total hysterectomy and bilateral salpingo-oophorectomy 06/26/2019   Hyperlipidemia 04/10/2019   Major depressive disorder with current active episode 04/10/2019   Allergic rhinitis 04/10/2019   PCOS (polycystic ovarian syndrome) 10/20/2012      Current Outpatient Medications:    aspirin  EC 81 MG tablet, Take 81 mg by mouth daily., Disp: , Rfl:    atorvastatin  (LIPITOR) 20 MG tablet, TAKE 1 TABLET BY MOUTH EVERYDAY AT BEDTIME, Disp: 90 tablet, Rfl: 3   cetirizine  (ZYRTEC ) 10 MG tablet, Take 1 tablet (10 mg total) by mouth daily as needed for allergies., Disp: , Rfl:    Continuous Glucose Sensor (DEXCOM G7 SENSOR) MISC, Inject 1 patch into the skin every 14 (fourteen) days., Disp: , Rfl:    CONTOUR NEXT TEST test strip, 3 (three) times daily., Disp: , Rfl:    EPINEPHrine  0.3 mg/0.3 mL IJ SOAJ injection, Inject 0.3 mg into the muscle as needed for anaphylaxis., Disp: 2 each, Rfl: 0   Estradiol  10 MCG TABS vaginal tablet, Place 1 tablet (10 mcg total) vaginally 2 (two) times a week., Disp: 8 tablet, Rfl: 11   hydrocortisone 2.5 % cream, Apply 1 Application topically as needed., Disp: , Rfl:    ketoconazole  (NIZORAL ) 2 % shampoo, APPLY 1 APPLICATION TOPICALLY 2 (TWO) TIMES A WEEK., Disp: 120 mL, Rfl: 2   nystatin (MYCOSTATIN/NYSTOP) powder, Apply 1 Application topically daily., Disp: , Rfl:    pantoprazole  (PROTONIX ) 40 MG tablet, TAKE 1 TABLET BY MOUTH EVERY DAY IN THE MORNING, Disp: 90 tablet, Rfl: 3   vortioxetine  HBr (TRINTELLIX ) 20 MG TABS tablet, Take 1 tablet (20 mg total) by mouth daily., Disp: 90 tablet, Rfl: 2   Allergies  Allergen Reactions   Peach [Prunus Persica] Anaphylaxis and Nausea And Vomiting   Metformin  And Related Other (See Comments)    Severe GI SE and intolerance   Betula Alba Oil Itching    Sneezing   Vitex Itching    Sneezing and itchy eyes  Birch and Oak     Social History   Tobacco Use    Smoking status: Former    Current packs/day: 1.00    Average packs/day: 1 pack/day for 5.0 years (5.0 ttl pk-yrs)    Types: Cigarettes   Smokeless tobacco: Never   Tobacco comments:    pt stopped at age 29  Vaping Use   Vaping status: Never Used  Substance Use Topics   Alcohol use: Yes    Comment: 2 times a year for special occasions   Drug use: No      Chart Review Today: I personally reviewed active problem list, medication list, allergies, family history, social history, health maintenance, notes from last encounter, lab results, imaging with the patient/caregiver today.   Review of Systems  Constitutional: Negative.   HENT: Negative.    Eyes: Negative.   Respiratory: Negative.    Cardiovascular: Negative.   Gastrointestinal: Negative.   Endocrine: Negative.   Genitourinary: Negative.   Musculoskeletal: Negative.   Skin: Negative.   Allergic/Immunologic: Negative.   Neurological: Negative.   Hematological: Negative.   Psychiatric/Behavioral: Negative.    All other systems reviewed and are negative.  Objective:   Vitals:   03/27/24 1121  BP: 118/76  Pulse: 75  Resp: 16  SpO2: 99%  Weight: 229 lb (103.9 kg)  Height: 5' 4 (1.626 m)    Body mass index is 39.31 kg/m.  Physical Exam Vitals and nursing note reviewed.  Constitutional:      General: She is not in acute distress.    Appearance: Normal appearance. She is well-developed and well-groomed. She is obese. She is not ill-appearing, toxic-appearing or diaphoretic.  HENT:     Head: Normocephalic and atraumatic.     Right Ear: External ear normal.     Left Ear: External ear normal.     Nose: Nose normal.  Eyes:     General: No scleral icterus.       Right eye: No discharge.        Left eye: No discharge.     Conjunctiva/sclera: Conjunctivae normal.  Neck:     Trachea: No tracheal deviation.  Cardiovascular:     Rate and Rhythm: Normal rate.  Pulmonary:     Effort: Pulmonary effort is  normal. No respiratory distress.     Breath sounds: No stridor.  Skin:    General: Skin is warm and dry.     Findings: No rash.  Neurological:     Mental Status: She is alert.     Motor: No abnormal muscle tone.     Coordination: Coordination normal.     Gait: Gait normal.  Psychiatric:        Mood and Affect: Mood normal.        Behavior: Behavior normal. Behavior is cooperative.      Results for orders placed or performed during the hospital encounter of 03/22/24  Urine Culture   Collection Time: 03/21/24 10:29 PM   Specimen: Urine, Clean Catch  Result Value Ref Range   Specimen Description      URINE, CLEAN CATCH Performed at [facility], [address], [place], [place] [address]    Special Requests      NONE Performed at [facility], [address], [place], [place] [address]    Culture (A)     <10,000 COLONIES/mL INSIGNIFICANT GROWTH Performed at [facility], [address] [place], [place] [address]    Report Status 03/23/2024 FINAL   CBC with Differential   Collection Time: 03/21/24 10:29 PM  Result Value Ref Range   WBC 12.0 (H) 4.0 - 10.5 K/uL   RBC 4.63 3.87 - 5.11 MIL/uL   Hemoglobin 12.8 12.0 - 15.0 g/dL   HCT 61.3 63.9 - 53.9 %   MCV 83.4 80.0 - 100.0 fL   MCH 27.6 26.0 - 34.0 pg   MCHC 33.2 30.0 - 36.0 g/dL   RDW 86.5 88.4 - 84.4 %   Platelets 245 150 - 400 K/uL   nRBC 0.0 0.0 - 0.2 %   Neutrophils Relative % 69 %   Neutro Abs 8.3 (H) 1.7 - 7.7 K/uL   Lymphocytes Relative 23 %   Lymphs Abs 2.7 0.7 - 4.0 K/uL   Monocytes Relative 6 %   Monocytes Absolute 0.7 0.1 - 1.0 K/uL   Eosinophils Relative 1 %   Eosinophils Absolute 0.2 0.0 - 0.5 K/uL   Basophils Relative 1 %   Basophils Absolute 0.1 0.0 - 0.1 K/uL   Immature Granulocytes 0 %   Abs Immature Granulocytes 0.04 0.00 - 0.07 K/uL  Basic metabolic panel   Collection Time: 03/21/24 10:29 PM  Result Value Ref Range   Sodium 139 135 - 145 mmol/L   Potassium 4.0  3.5 - 5.1 mmol/L   Chloride 103 98 - 111 mmol/L   CO2 25 22 - 32 mmol/L   Glucose, Bld 98 70 - 99 mg/dL   BUN 19 6 - 20 mg/dL   Creatinine, Ser 9.00 0.44 - 1.00 mg/dL   Calcium  9.3 8.9 - 10.3 mg/dL   GFR, Estimated >39 >39 mL/min   Anion gap 11 5 - 15  Urinalysis, Routine w reflex microscopic -Urine, Clean Catch   Collection Time: 03/21/24 10:29 PM  Result Value Ref Range   Color, Urine STRAW (A) YELLOW   APPearance CLEAR (A) CLEAR   Specific Gravity, Urine 1.008 1.005 - 1.030   pH 5.0 5.0 - 8.0   Glucose, UA NEGATIVE NEGATIVE mg/dL   Hgb urine dipstick NEGATIVE NEGATIVE   Bilirubin Urine NEGATIVE NEGATIVE   Ketones, ur NEGATIVE NEGATIVE mg/dL   Protein, ur NEGATIVE NEGATIVE mg/dL   Nitrite NEGATIVE NEGATIVE   Leukocytes,Ua NEGATIVE NEGATIVE  Hepatic function panel   Collection Time: 03/21/24 10:29 PM  Result Value Ref Range   Total Protein 7.2 6.5 - 8.1 g/dL   Albumin 4.2 3.5 - 5.0 g/dL   AST 19 15 - 41 U/L   ALT 25 0 - 44 U/L   Alkaline Phosphatase 64 38 - 126 U/L   Total Bilirubin 0.7 0.0 - 1.2 mg/dL   Bilirubin, Direct <9.8 0.0 - 0.2 mg/dL   Indirect Bilirubin NOT CALCULATED 0.3 - 0.9 mg/dL  Lipase, blood   Collection Time: 03/21/24 10:29 PM  Result Value Ref Range   Lipase 50 11 - 51 U/L  POC Urine Pregnancy, ED   Collection Time: 03/21/24 10:34 PM  Result Value Ref Range   Preg Test, Ur Negative Negative  CBG monitoring, ED   Collection Time: 03/22/24 11:13 AM  Result Value Ref Range   Glucose-Capillary 77 70 - 99 mg/dL  HIV Antibody (routine testing w rflx)   Collection Time: 03/22/24 12:47 PM  Result Value Ref Range   HIV Screen 4th Generation wRfx Non Reactive Non Reactive  CBG monitoring, ED   Collection Time: 03/22/24  4:12 PM  Result Value Ref Range   Glucose-Capillary 82 70 - 99 mg/dL  CBG monitoring, ED   Collection Time: 03/22/24 10:12 PM  Result Value Ref Range   Glucose-Capillary 80 70 - 99 mg/dL  Glucose, capillary   Collection Time:  03/23/24 12:48 AM  Result Value Ref Range   Glucose-Capillary 84 70 - 99 mg/dL  Lipase, blood   Collection Time: 03/23/24  3:21 AM  Result Value Ref Range   Lipase 33 11 - 51 U/L  Comprehensive metabolic panel   Collection Time: 03/23/24  3:23 AM  Result Value Ref Range   Sodium 140 135 - 145 mmol/L   Potassium 3.7 3.5 - 5.1 mmol/L   Chloride 107 98 - 111 mmol/L   CO2 26 22 - 32 mmol/L   Glucose, Bld 83 70 - 99 mg/dL   BUN 11 6 - 20 mg/dL   Creatinine, Ser 9.21 0.44 - 1.00 mg/dL   Calcium  8.6 (L) 8.9 - 10.3 mg/dL   Total Protein 6.3 (L) 6.5 - 8.1 g/dL   Albumin 3.5 3.5 - 5.0 g/dL   AST 87 (H) 15 - 41 U/L   ALT 151 (H) 0 - 44 U/L   Alkaline Phosphatase 81 38 - 126 U/L   Total Bilirubin 0.9 0.0 -  1.2 mg/dL   GFR, Estimated >39 >39 mL/min   Anion gap 7 5 - 15  CBC   Collection Time: 03/23/24  3:23 AM  Result Value Ref Range   WBC 8.4 4.0 - 10.5 K/uL   RBC 4.15 3.87 - 5.11 MIL/uL   Hemoglobin 11.5 (L) 12.0 - 15.0 g/dL   HCT 65.6 (L) 63.9 - 53.9 %   MCV 82.7 80.0 - 100.0 fL   MCH 27.7 26.0 - 34.0 pg   MCHC 33.5 30.0 - 36.0 g/dL   RDW 86.4 88.4 - 84.4 %   Platelets 186 150 - 400 K/uL   nRBC 0.0 0.0 - 0.2 %  Lipid panel   Collection Time: 03/23/24  3:23 AM  Result Value Ref Range   Cholesterol 122 0 - 200 mg/dL   Triglycerides 88 <849 mg/dL   HDL 31 (L) >59 mg/dL   Total CHOL/HDL Ratio 3.9 RATIO   VLDL 18 0 - 40 mg/dL   LDL Cholesterol 73 0 - 99 mg/dL  Glucose, capillary   Collection Time: 03/23/24  7:37 AM  Result Value Ref Range   Glucose-Capillary 88 70 - 99 mg/dL   Comment 1 Notify RN    Comment 2 Document in Chart   Glucose, capillary   Collection Time: 03/23/24 12:27 PM  Result Value Ref Range   Glucose-Capillary 117 (H) 70 - 99 mg/dL   Comment 1 Notify RN    Comment 2 Document in Chart   Glucose, capillary   Collection Time: 03/23/24  5:26 PM  Result Value Ref Range   Glucose-Capillary 89 70 - 99 mg/dL  Glucose, capillary   Collection Time: 03/24/24   3:42 AM  Result Value Ref Range   Glucose-Capillary 87 70 - 99 mg/dL  Glucose, capillary   Collection Time: 03/24/24  9:28 AM  Result Value Ref Range   Glucose-Capillary 141 (H) 70 - 99 mg/dL   Comment 1 Notify RN   Comprehensive metabolic panel   Collection Time: 03/24/24 10:02 AM  Result Value Ref Range   Sodium 138 135 - 145 mmol/L   Potassium 3.3 (L) 3.5 - 5.1 mmol/L   Chloride 103 98 - 111 mmol/L   CO2 24 22 - 32 mmol/L   Glucose, Bld 140 (H) 70 - 99 mg/dL   BUN 10 6 - 20 mg/dL   Creatinine, Ser 9.22 0.44 - 1.00 mg/dL   Calcium  9.0 8.9 - 10.3 mg/dL   Total Protein 6.9 6.5 - 8.1 g/dL   Albumin 3.6 3.5 - 5.0 g/dL   AST 36 15 - 41 U/L   ALT 87 (H) 0 - 44 U/L   Alkaline Phosphatase 75 38 - 126 U/L   Total Bilirubin 0.7 0.0 - 1.2 mg/dL   GFR, Estimated >39 >39 mL/min   Anion gap 11 5 - 15  Glucose, capillary   Collection Time: 03/24/24 11:55 AM  Result Value Ref Range   Glucose-Capillary 86 70 - 99 mg/dL   Comment 1 Notify RN    Comment 2 Document in Chart        Assessment & Plan:   1. Encounter for examination following treatment at hospital (Primary) Reviewed thoroughly records, results with pt today- see above  2. Acute pancreatitis, unspecified complication status, unspecified pancreatitis type Lab Results  Component Value Date   LIPASE 33 03/23/2024  Holding mounjaro  indefinitely Lipase was never elevated, CT scan shows pancreatitis Consider GI f/up - she is due to f/up with [facility]  3. Hypokalemia- supplemented in hospital and at discharge with PO, recheck CMP   Chemistry      Component Value Date/Time   NA 138 03/24/2024 1002   NA 143 11/09/2018 0943   K 3.3 (L) 03/24/2024 1002   CL 103 03/24/2024 1002   CO2 24 03/24/2024 1002   BUN 10 03/24/2024 1002   BUN 11 11/09/2018 0943   CREATININE 0.77 03/24/2024 1002   CREATININE 0.92 04/29/2023 0910      Component Value Date/Time   CALCIUM  9.0 03/24/2024 1002   ALKPHOS 75 03/24/2024 1002    AST 36 03/24/2024 1002   ALT 87 (H) 03/24/2024 1002   BILITOT 0.7 03/24/2024 1002   BILITOT 0.3 11/09/2018 0943       4. Elevated liver enzymes Recommend trending/recheck next week or after - CMP ordered  Lab Results  Component Value Date   ALT 87 (H) 03/24/2024   AST 36 03/24/2024   ALKPHOS 75 03/24/2024   BILITOT 0.7 03/24/2024    5. Type 2 diabetes mellitus without complication, without long-term current use of insulin  (HCC) Dexcom reading reviewed today with pt Her A1c hx reviewed as well She will be off meds for now, avoid GLP-1, in restrospect she thinks she's had similar pain over the past year since starting mounjaro  Severe GI intolerance to metformin  and ozempic Recheck A1c in 3 months, can also track CGM, possibly add new med if needed for glycemic control - Hemoglobin A1c; Future  6. S/P total hysterectomy and bilateral salpingo-oophorectomy Prior HRT discussed - still need to consult hematology regarding HRT safety?   7. Prothrombin gene mutation (HCC) See #6  8. Lynch syndrome Lost to f/up with [facility] in [place], last procedure and follow up was 07/2022, supposed to be annual - Ambulatory referral to Gastroenterology   9. Class 2 severe obesity with serious comorbidity and body mass index (BMI) of 39.0 to 39.9 in adult, unspecified obesity type (HCC) Additionally pt had weight concerns now being off mounjaro , she has PCOS, DM, has always been strict with diet/calories, done nutrtitional consults, exercises currently working with trainer - discussed option to consult with medical weight management if desired  Additional A&P details gleamed from AI today: Assessment & Plan Acute pancreatitis Recent episode with abdominal pain. CT scan showed hazy straining about the pancreatic head with trace fluid. Lipase levels were not elevated, but liver enzymes were high. No history of gallbladder issues. Possible association with GLP-1 agonist use (Tirzepatide ). -  Discontinue Tirzepatide  (Mounjaro ) due to potential association with pancreatitis. - Monitor symptoms and liver enzymes. - Consider referral to GI specialist for further evaluation if symptoms persist or recur. - Educate on avoiding alcohol and monitoring for symptoms of pancreatitis.  Abnormal liver enzymes Elevated liver enzymes noted during recent hospitalization, now trending downwards. Likely related to acute pancreatitis. - Recheck liver function tests in the next couple of weeks. - Monitor for symptoms of liver dysfunction.  Type 2 diabetes mellitus Recent hospitalization where insulin  was administered. Last HbA1c was 5.1%. Concerns about glycemic control after discontinuation of Tirzepatide . Hospital glycemic control differs from outpatient management. - Monitor fasting blood glucose levels at home. - Use continuous glucose monitor (Dexcom G7) to track glucose trends. - Recheck HbA1c in three months to assess impact of discontinuing Tirzepatide . - Discuss potential alternative diabetes medications if glycemic control worsens.  Class 2 severe obesity BMI of 39.0 to 39.9. Concerns about weight management after discontinuation of Tirzepatide . Actively engaged in weight loss efforts  through diet and exercise. - Continue current diet and exercise regimen. - Consider referral to a medical weight management specialist for further support. - Monitor weight and BMI.  Hypokalemia Recent episode during hospitalization, likely due to lack of oral intake and IV fluid administration. Potassium levels were corrected with supplementation. - Recheck electrolytes, including potassium, in the next couple of weeks. - Monitor for symptoms of electrolyte imbalance.  Lynch syndrome Increased risk of colorectal cancer. Last colonoscopy was more than a year ago. Previous GI specialist retired. - Refer to GI specialist for colonoscopy and follow-up care. - Ensure GI specialist is aware of Lynch syndrome  and family history.  Prothrombin gene mutation History of portal vein thrombosis. Concerns about hormone replacement therapy due to clotting risk. Potential benefits include decreased cancer risk, improved bone health, and cardiovascular protection. - Consult with hematology regarding the safety of hormone replacement therapy. - Discuss potential benefits and risks of hormone replacement therapy with hematology.  Recording duration: 26 minutes    Michelene Cower, PA-C 03/27/24 11:30 AM

## 2024-04-06 ENCOUNTER — Ambulatory Visit: Payer: Self-pay

## 2024-04-06 NOTE — Telephone Encounter (Signed)
 FYI Only or Action Required?: FYI only for provider.  Patient was last seen in primary care on 03/27/2024 by Leavy Mole, PA-C.  Called Nurse Triage reporting Abdominal Pain.  Symptoms began today.  Interventions attempted: Rest, hydration, or home remedies.  Symptoms are: unchanged.  Triage Disposition: See Physician Within 24 Hours  Patient/caregiver understands and will follow disposition?: Yes, will follow disposition  Copied from CRM [id number]. Topic: Clinical - Red Word Triage >> Apr 06, 2024 12:15 PM Joanna Scott wrote: Red Word that prompted transfer to Nurse Triage: Hospitalized on 09/10 for pancreatitis and thinks it may be coming back. Has a full feeling, or ache in her upper abdomen and back. Reason for Disposition  [1] MODERATE pain (e.g., interferes with normal activities) AND [2] comes and goes (cramps) AND [3] present > 24 hours  (Exception: Pain with Vomiting or Diarrhea - see that Guideline.)  Answer Assessment - Initial Assessment Questions 1. LOCATION: Where does it hurt?      Upper abd, radiates into the back, same location and feeling of pancreatitis, but not as severe 3. ONSET: When did the pain begin? (e.g., minutes, hours or days ago)       Woke with it this morning 4. SUDDEN: Gradual or sudden onset?     gradual 5. PATTERN Does the pain come and go, or is it constant?     intermittent 6. SEVERITY: How bad is the pain?  (e.g., Scale 1-10; mild, moderate, or severe)     Not as bad as last time 7. RECURRENT SYMPTOM: Have you ever had this type of stomach pain before? If Yes, ask: When was the last time? and What happened that time?      Yes pancreatitis 8. AGGRAVATING FACTORS: Does anything seem to cause this pain? (e.g., foods, stress, alcohol)     Heating pad making it better, not eating because does not want to make it worse 9. CARDIAC SYMPTOMS: Do you have any of the following symptoms: chest pain, difficulty breathing, sweating,  nausea?     denies 10. OTHER SYMPTOMS: Do you have any other symptoms? (e.g., back pain, diarrhea, fever, urination pain, vomiting)       denies  Protocols used: Abdominal Pain - Upper-A-AH

## 2024-04-07 ENCOUNTER — Encounter: Payer: Self-pay | Admitting: Family Medicine

## 2024-04-07 ENCOUNTER — Ambulatory Visit (INDEPENDENT_AMBULATORY_CARE_PROVIDER_SITE_OTHER): Admitting: Family Medicine

## 2024-04-07 VITALS — BP 124/76 | HR 73 | Resp 16 | Ht 64.0 in | Wt 235.2 lb

## 2024-04-07 DIAGNOSIS — Z8719 Personal history of other diseases of the digestive system: Secondary | ICD-10-CM

## 2024-04-07 DIAGNOSIS — R1013 Epigastric pain: Secondary | ICD-10-CM | POA: Diagnosis not present

## 2024-04-07 DIAGNOSIS — E785 Hyperlipidemia, unspecified: Secondary | ICD-10-CM

## 2024-04-07 DIAGNOSIS — E876 Hypokalemia: Secondary | ICD-10-CM | POA: Diagnosis not present

## 2024-04-07 DIAGNOSIS — Z23 Encounter for immunization: Secondary | ICD-10-CM

## 2024-04-07 DIAGNOSIS — E1169 Type 2 diabetes mellitus with other specified complication: Secondary | ICD-10-CM

## 2024-04-07 LAB — CBC WITH DIFFERENTIAL/PLATELET
Absolute Lymphocytes: 2033 {cells}/uL (ref 850–3900)
Absolute Monocytes: 388 {cells}/uL (ref 200–950)
Basophils Absolute: 48 {cells}/uL (ref 0–200)
Basophils Relative: 0.7 %
Eosinophils Absolute: 218 {cells}/uL (ref 15–500)
Eosinophils Relative: 3.2 %
HCT: 38 % (ref 35.0–45.0)
Hemoglobin: 12.2 g/dL (ref 11.7–15.5)
MCH: 27.3 pg (ref 27.0–33.0)
MCHC: 32.1 g/dL (ref 32.0–36.0)
MCV: 85 fL (ref 80.0–100.0)
MPV: 11 fL (ref 7.5–12.5)
Monocytes Relative: 5.7 %
Neutro Abs: 4114 {cells}/uL (ref 1500–7800)
Neutrophils Relative %: 60.5 %
Platelets: 252 Thousand/uL (ref 140–400)
RBC: 4.47 Million/uL (ref 3.80–5.10)
RDW: 13.4 % (ref 11.0–15.0)
Total Lymphocyte: 29.9 %
WBC: 6.8 Thousand/uL (ref 3.8–10.8)

## 2024-04-07 LAB — COMPREHENSIVE METABOLIC PANEL WITH GFR
AG Ratio: 1.8 (calc) (ref 1.0–2.5)
ALT: 37 U/L — ABNORMAL HIGH (ref 6–29)
AST: 26 U/L (ref 10–35)
Albumin: 4.2 g/dL (ref 3.6–5.1)
Alkaline phosphatase (APISO): 60 U/L (ref 31–125)
BUN: 14 mg/dL (ref 7–25)
CO2: 28 mmol/L (ref 20–32)
Calcium: 9.4 mg/dL (ref 8.6–10.2)
Chloride: 106 mmol/L (ref 98–110)
Creat: 0.97 mg/dL (ref 0.50–0.99)
Globulin: 2.4 g/dL (ref 1.9–3.7)
Glucose, Bld: 91 mg/dL (ref 65–99)
Potassium: 4.3 mmol/L (ref 3.5–5.3)
Sodium: 140 mmol/L (ref 135–146)
Total Bilirubin: 0.5 mg/dL (ref 0.2–1.2)
Total Protein: 6.6 g/dL (ref 6.1–8.1)
eGFR: 73 mL/min/1.73m2 (ref >=60)

## 2024-04-07 LAB — LIPASE: Lipase: 23 U/L (ref 7–60)

## 2024-04-07 LAB — AMYLASE: Amylase: 27 U/L (ref 21–101)

## 2024-04-07 NOTE — Progress Notes (Signed)
 Name: Joanna Scott   MRN: [id number]    DOB: 04/28/1976   Date:04/07/2024       Progress Note  Subjective  Chief Complaint  Chief Complaint  Patient presents with   Abdominal Pain    Feeling much better today    Discussed the use of AI scribe software for clinical note transcription with the patient, who gave verbal consent to proceed.  History of Present Illness Aliviana Burdell is a 48 year old female with a history of pancreatitis who presents with recurrent epigastric pain.  She was recently hospitalized for pancreatitis and discharged on March 24, 2024. Initially, she felt better after discharge but experienced a recurrence of symptoms yesterday, describing soreness in the epigastric area similar to her pre-hospitalization symptoms. The pain is less severe now, more of a soreness than pain.  Prior to hospitalization, she experienced soreness in the right lower quadrant, initially suspecting appendicitis. During her hospital stay, a CT scan showed hazy straining of the pancreatic head and no kidney stones were found. Her liver enzymes were elevated but were trending downwards at discharge.  She was previously on Mounjaro , suspected to have induced her pancreatitis, and discontinued it three weeks ago. She manages her symptoms with a clear liquid diet and reports feeling better today. She was given a potassium supplement upon discharge due to low potassium levels.  She has a history of diabetes, diagnosed last year, and was using a continuous glucose monitor. Her A1c levels have varied, with a recent reading of 5.1 in May, down from 6.8 in April. She has lost approximately 50 pounds recently and is not on any diabetes medication following the discontinuation of Mounjaro .  No diarrhea, vomiting, fever, chills, or swelling.    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   Allergy with anaphylaxis due to food 06/12/2020   Metabolic syndrome 06/12/2020   Iron deficiency anemia 12/14/2019   Hypothyroidism 10/04/2019   Seborrheic dermatitis of scalp 10/04/2019   Class 2 severe obesity with serious comorbidity and body mass index (BMI) of 39.0 to 39.9 in adult 10/04/2019   Prothrombin gene mutation 10/04/2019   S/P total hysterectomy and bilateral salpingo-oophorectomy 06/26/2019   Hyperlipidemia 04/10/2019   Major depressive disorder with current active episode 04/10/2019   Allergic rhinitis 04/10/2019   PCOS (polycystic ovarian syndrome) 10/20/2012    Social History   Tobacco Use   Smoking status: Former    Current packs/day: 1.00    Average packs/day: 1 pack/day for 5.0 years (5.0 ttl pk-yrs)    Types: Cigarettes   Smokeless tobacco: Never   Tobacco comments:    pt stopped at age 68  Substance Use Topics   Alcohol use: Yes    Comment: 2 times a year for special occasions     Current Outpatient Medications:    aspirin  EC 81 MG tablet, Take 81 mg by mouth daily., Disp: , Rfl:    atorvastatin  (LIPITOR) 20 MG tablet, TAKE 1 TABLET BY MOUTH EVERYDAY AT BEDTIME, Disp: 90 tablet, Rfl: 3   cetirizine  (ZYRTEC ) 10 MG tablet, Take 1 tablet (10 mg total) by mouth daily as needed for allergies., Disp: , Rfl:    Continuous Glucose Sensor (DEXCOM  G7 SENSOR) MISC, Inject 1 patch into the skin every 14 (fourteen) days., Disp: , Rfl:    CONTOUR NEXT TEST test strip, 3 (three) times daily., Disp: , Rfl:    EPINEPHrine  0.3 mg/0.3 mL IJ SOAJ injection, Inject 0.3 mg into the muscle as needed for anaphylaxis., Disp: 2 each, Rfl: 0   Estradiol  10 MCG TABS vaginal tablet, Place 1 tablet (10 mcg total) vaginally 2 (two) times a week., Disp: 8 tablet, Rfl: 11   hydrocortisone 2.5 %  cream, Apply 1 Application topically as needed., Disp: , Rfl:    ketoconazole  (NIZORAL ) 2 % shampoo, APPLY 1 APPLICATION TOPICALLY 2 (TWO) TIMES A WEEK., Disp: 120 mL, Rfl: 2   nystatin (MYCOSTATIN/NYSTOP) powder, Apply 1 Application topically daily., Disp: , Rfl:    pantoprazole  (PROTONIX ) 40 MG tablet, TAKE 1 TABLET BY MOUTH EVERY DAY IN THE MORNING, Disp: 90 tablet, Rfl: 3   vortioxetine  HBr (TRINTELLIX ) 20 MG TABS tablet, Take 1 tablet (20 mg total) by mouth daily., Disp: 90 tablet, Rfl: 2  Allergies  Allergen Reactions   Peach [Prunus Persica] Anaphylaxis and Nausea And Vomiting   Metformin  And Related Other (See Comments)    Severe GI SE and intolerance   Betula Alba Oil Itching    Sneezing   Vitex Itching    Sneezing and itchy eyes  [place]    ROS  Ten systems reviewed and is negative except as mentioned in HPI    Objective  Vitals:   04/07/24 0941  BP: 124/76  Pulse: 73  Resp: 16  SpO2: 98%  Weight: 235 lb 3.2 oz (106.7 kg)  Height: 5' 4 (1.626 m)    Body mass index is 40.37 kg/m.  Physical Exam CONSTITUTIONAL: Patient appears well-developed and well-nourished. No distress. HEENT: Head atraumatic, normocephalic, neck supple. CARDIOVASCULAR: Normal rate, regular rhythm and normal heart sounds. No murmur heard. No BLE edema. PULMONARY: Effort normal and breath sounds normal. No respiratory distress. ABDOMINAL: There is no tenderness or distention. MUSCULOSKELETAL: Normal gait. Without gross motor or sensory deficit. PSYCHIATRIC: Patient has a normal mood and affect. Behavior is normal. Judgment and thought content normal.  Recent Results (from the past 2160 hours)  CBC with Differential     Status: Abnormal   Collection Time: 03/21/24 10:29 PM  Result Value Ref Range   WBC 12.0 (H) 4.0 - 10.5 K/uL   RBC 4.63 3.87 - 5.11 MIL/uL   Hemoglobin 12.8 12.0 - 15.0 g/dL   HCT 61.3 63.9 - 53.9 %   MCV 83.4 80.0 - 100.0 fL   MCH 27.6 26.0 - 34.0 pg   MCHC  33.2 30.0 - 36.0 g/dL   RDW 86.5 88.4 - 84.4 %   Platelets 245 150 - 400 K/uL   nRBC 0.0 0.0 - 0.2 %   Neutrophils Relative % 69 %   Neutro Abs 8.3 (H) 1.7 - 7.7 K/uL   Lymphocytes Relative 23 %   Lymphs Abs 2.7 0.7 - 4.0 K/uL   Monocytes Relative 6 %   Monocytes Absolute 0.7 0.1 - 1.0 K/uL   Eosinophils Relative 1 %   Eosinophils Absolute 0.2 0.0 - 0.5 K/uL   Basophils Relative 1 %   Basophils Absolute 0.1 0.0 - 0.1 K/uL   Immature Granulocytes 0 %   Abs Immature Granulocytes 0.04 0.00 - 0.07 K/uL    Comment: Performed at [facility], [address] [place], [place] [address]  Basic metabolic panel     Status: None  Collection Time: 03/21/24 10:29 PM  Result Value Ref Range   Sodium 139 135 - 145 mmol/L   Potassium 4.0 3.5 - 5.1 mmol/L   Chloride 103 98 - 111 mmol/L   CO2 25 22 - 32 mmol/L   Glucose, Bld 98 70 - 99 mg/dL    Comment: Glucose reference range applies only to samples taken after fasting for at least 8 hours.   BUN 19 6 - 20 mg/dL   Creatinine, Ser 9.00 0.44 - 1.00 mg/dL   Calcium  9.3 8.9 - 10.3 mg/dL   GFR, Estimated >39 >39 mL/min    Comment: (NOTE) Calculated using the CKD-EPI Creatinine Equation (2021)    Anion gap 11 5 - 15    Comment: Performed at [facility], [address] [place], [place] [address]  Urinalysis, Routine w reflex microscopic -Urine, Clean Catch     Status: Abnormal   Collection Time: 03/21/24 10:29 PM  Result Value Ref Range   Color, Urine STRAW (A) YELLOW   APPearance CLEAR (A) CLEAR   Specific Gravity, Urine 1.008 1.005 - 1.030   pH 5.0 5.0 - 8.0   Glucose, UA NEGATIVE NEGATIVE mg/dL   Hgb urine dipstick NEGATIVE NEGATIVE   Bilirubin Urine NEGATIVE NEGATIVE   Ketones, ur NEGATIVE NEGATIVE mg/dL   Protein, ur NEGATIVE NEGATIVE mg/dL   Nitrite NEGATIVE NEGATIVE   Leukocytes,Ua NEGATIVE NEGATIVE    Comment: Performed at [facility], [address] [place], [place] [address]  Urine Culture      Status: Abnormal   Collection Time: 03/21/24 10:29 PM   Specimen: Urine, Clean Catch  Result Value Ref Range   Specimen Description      URINE, CLEAN CATCH Performed at [facility], [address] [place], [place] [address]    Special Requests      NONE Performed at [facility], [address], [place], [place] [address]    Culture (A)     <10,000 COLONIES/mL INSIGNIFICANT GROWTH Performed at [facility], [address], [place], [place] [address]    Report Status 03/23/2024 FINAL   Hepatic function panel     Status: None   Collection Time: 03/21/24 10:29 PM  Result Value Ref Range   Total Protein 7.2 6.5 - 8.1 g/dL   Albumin 4.2 3.5 - 5.0 g/dL   AST 19 15 - 41 U/L   ALT 25 0 - 44 U/L   Alkaline Phosphatase 64 38 - 126 U/L   Total Bilirubin 0.7 0.0 - 1.2 mg/dL   Bilirubin, Direct <9.8 0.0 - 0.2 mg/dL   Indirect Bilirubin NOT CALCULATED 0.3 - 0.9 mg/dL    Comment: Performed at [facility], [address] [place], [place] [address]  Lipase, blood     Status: None   Collection Time: 03/21/24 10:29 PM  Result Value Ref Range   Lipase 50 11 - 51 U/L    Comment: Performed at [facility], [address] [place], [place] [address]  POC Urine Pregnancy, ED     Status: None   Collection Time: 03/21/24 10:34 PM  Result Value Ref Range   Preg Test, Ur Negative Negative  CBG monitoring, ED     Status: None   Collection Time: 03/22/24 11:13 AM  Result Value Ref Range   Glucose-Capillary 77 70 - 99 mg/dL    Comment: Glucose reference range applies only to samples taken after fasting for at least 8 hours.  HIV Antibody (routine  testing w rflx)     Status: None   Collection Time: 03/22/24 12:47 PM  Result Value Ref Range   HIV Screen 4th Generation wRfx Non Reactive Non Reactive    Comment: Performed at [facility], [address], [place], [place] [address]  CBG monitoring, ED     Status: None   Collection Time: 03/22/24   4:12 PM  Result Value Ref Range   Glucose-Capillary 82 70 - 99 mg/dL    Comment: Glucose reference range applies only to samples taken after fasting for at least 8 hours.  CBG monitoring, ED     Status: None   Collection Time: 03/22/24 10:12 PM  Result Value Ref Range   Glucose-Capillary 80 70 - 99 mg/dL    Comment: Glucose reference range applies only to samples taken after fasting for at least 8 hours.  Glucose, capillary     Status: None   Collection Time: 03/23/24 12:48 AM  Result Value Ref Range   Glucose-Capillary 84 70 - 99 mg/dL    Comment: Glucose reference range applies only to samples taken after fasting for at least 8 hours.  Lipase, blood     Status: None   Collection Time: 03/23/24  3:21 AM  Result Value Ref Range   Lipase 33 11 - 51 U/L    Comment: Performed at [facility], [address] [place], [place] [address]  Comprehensive metabolic panel     Status: Abnormal   Collection Time: 03/23/24  3:23 AM  Result Value Ref Range   Sodium 140 135 - 145 mmol/L   Potassium 3.7 3.5 - 5.1 mmol/L   Chloride 107 98 - 111 mmol/L   CO2 26 22 - 32 mmol/L   Glucose, Bld 83 70 - 99 mg/dL    Comment: Glucose reference range applies only to samples taken after fasting for at least 8 hours.   BUN 11 6 - 20 mg/dL   Creatinine, Ser 9.21 0.44 - 1.00 mg/dL   Calcium  8.6 (L) 8.9 - 10.3 mg/dL   Total Protein 6.3 (L) 6.5 - 8.1 g/dL   Albumin 3.5 3.5 - 5.0 g/dL   AST 87 (H) 15 - 41 U/L   ALT 151 (H) 0 - 44 U/L   Alkaline Phosphatase 81 38 - 126 U/L   Total Bilirubin 0.9 0.0 - 1.2 mg/dL   GFR, Estimated >39 >39 mL/min    Comment: (NOTE) Calculated using the CKD-EPI Creatinine Equation (2021)    Anion gap 7 5 - 15    Comment: Performed at [facility], [address] [place], [place] [address]  CBC     Status: Abnormal   Collection Time: 03/23/24  3:23 AM  Result Value Ref Range   WBC 8.4 4.0 - 10.5 K/uL   RBC 4.15 3.87 - 5.11 MIL/uL   Hemoglobin 11.5 (L)  12.0 - 15.0 g/dL   HCT 65.6 (L) 63.9 - 53.9 %   MCV 82.7 80.0 - 100.0 fL   MCH 27.7 26.0 - 34.0 pg   MCHC 33.5 30.0 - 36.0 g/dL   RDW 86.4 88.4 - 84.4 %   Platelets 186 150 - 400 K/uL   nRBC 0.0 0.0 - 0.2 %    Comment: Performed at [facility], [address] [place], [place] [address]  Lipid panel     Status: Abnormal   Collection Time: 03/23/24  3:23 AM  Result Value Ref Range   Cholesterol 122 0 - 200 mg/dL  Triglycerides 88 <150 mg/dL   HDL 31 (L) >59 mg/dL   Total CHOL/HDL Ratio 3.9 RATIO   VLDL 18 0 - 40 mg/dL   LDL Cholesterol 73 0 - 99 mg/dL    Comment:        Total Cholesterol/HDL:CHD Risk Coronary Heart Disease Risk Table                     Men   Women  1/2 Average Risk   3.4   3.3  Average Risk       5.0   4.4  2 X Average Risk   9.6   7.1  3 X Average Risk  23.4   11.0        Use the calculated Patient Ratio above and the CHD Risk Table to determine the patient's CHD Risk.        ATP III CLASSIFICATION (LDL):  <100     mg/dL   Optimal  899-870  mg/dL   Near or Above                    Optimal  130-159  mg/dL   Borderline  839-810  mg/dL   High  >809     mg/dL   Very High Performed at [facility], [address] [place], [place] [address]   Glucose, capillary     Status: None   Collection Time: 03/23/24  7:37 AM  Result Value Ref Range   Glucose-Capillary 88 70 - 99 mg/dL    Comment: Glucose reference range applies only to samples taken after fasting for at least 8 hours.   Comment 1 Notify RN    Comment 2 Document in Chart   Glucose, capillary     Status: Abnormal   Collection Time: 03/23/24 12:27 PM  Result Value Ref Range   Glucose-Capillary 117 (H) 70 - 99 mg/dL    Comment: Glucose reference range applies only to samples taken after fasting for at least 8 hours.   Comment 1 Notify RN    Comment 2 Document in Chart   Glucose, capillary     Status: None   Collection Time: 03/23/24  5:26 PM  Result Value Ref Range    Glucose-Capillary 89 70 - 99 mg/dL    Comment: Glucose reference range applies only to samples taken after fasting for at least 8 hours.  Glucose, capillary     Status: None   Collection Time: 03/24/24  3:42 AM  Result Value Ref Range   Glucose-Capillary 87 70 - 99 mg/dL    Comment: Glucose reference range applies only to samples taken after fasting for at least 8 hours.  Glucose, capillary     Status: Abnormal   Collection Time: 03/24/24  9:28 AM  Result Value Ref Range   Glucose-Capillary 141 (H) 70 - 99 mg/dL    Comment: Glucose reference range applies only to samples taken after fasting for at least 8 hours.   Comment 1 Notify RN   Comprehensive metabolic panel     Status: Abnormal   Collection Time: 03/24/24 10:02 AM  Result Value Ref Range   Sodium 138 135 - 145 mmol/L   Potassium 3.3 (L) 3.5 - 5.1 mmol/L   Chloride 103 98 - 111 mmol/L   CO2 24 22 - 32 mmol/L   Glucose, Bld 140 (H) 70 - 99 mg/dL    Comment: Glucose reference range applies only to samples taken after fasting for at least  8 hours.   BUN 10 6 - 20 mg/dL   Creatinine, Ser 9.22 0.44 - 1.00 mg/dL   Calcium  9.0 8.9 - 10.3 mg/dL   Total Protein 6.9 6.5 - 8.1 g/dL   Albumin 3.6 3.5 - 5.0 g/dL   AST 36 15 - 41 U/L   ALT 87 (H) 0 - 44 U/L   Alkaline Phosphatase 75 38 - 126 U/L   Total Bilirubin 0.7 0.0 - 1.2 mg/dL   GFR, Estimated >39 >39 mL/min    Comment: (NOTE) Calculated using the CKD-EPI Creatinine Equation (2021)    Anion gap 11 5 - 15    Comment: Performed at [facility], [address] [place], [place] [address]  Glucose, capillary     Status: None   Collection Time: 03/24/24 11:55 AM  Result Value Ref Range   Glucose-Capillary 86 70 - 99 mg/dL    Comment: Glucose reference range applies only to samples taken after fasting for at least 8 hours.   Comment 1 Notify RN    Comment 2 Document in Chart       Assessment & Plan Acute pancreatitis with epigastric pain Recent hospitalization for  likely medication-induced pancreatitis. Mild epigastric soreness present. Ongoing symptoms related to pancreatitis observed. - Order CBC, comprehensive metabolic panel, amylase, and lipase tests. - Advise bland diet with small, balanced meals; avoid high-fat or high-protein meals. - Discussed potential pancreatic insufficiency; consider Creon if symptoms persist. - Administer flu shot.  Type 2 diabetes mellitus with associated  dyslipidemia Previously managed with Mounjaro , discontinued due to pancreatitis. Glucose levels monitored with continuous glucose monitor. A1c improved from 7.4 to 5.1. - Monitor glucose levels with continuous glucose monitor. - Check A1c in 2-3 months to assess need for alternative diabetes management. - Consider referral to medical weight loss specialist if A1c or weight increases.

## 2024-04-10 ENCOUNTER — Ambulatory Visit: Payer: Self-pay | Admitting: Family Medicine

## 2024-04-15 ENCOUNTER — Other Ambulatory Visit: Payer: Self-pay | Admitting: Family Medicine

## 2024-04-15 ENCOUNTER — Ambulatory Visit
Admission: EM | Admit: 2024-04-15 | Discharge: 2024-04-15 | Disposition: A | Discharge status: Home / Self Care | Attending: Emergency Medicine | Admitting: Emergency Medicine

## 2024-04-15 DIAGNOSIS — H00015 Hordeolum externum left lower eyelid: Secondary | ICD-10-CM

## 2024-04-15 DIAGNOSIS — E119 Type 2 diabetes mellitus without complications: Secondary | ICD-10-CM

## 2024-04-15 MED ORDER — POLYMYXIN B-TRIMETHOPRIM 10000-0.1 UNIT/ML-% OP SOLN: 1.0000 [drp] | Freq: Four times a day (QID) | OPHTHALMIC | 0 refills | Status: AC

## 2024-04-15 NOTE — ED Triage Notes (Signed)
 Patient to Urgent Care with complaints of left sided, lower eyelid swelling and redness. Reports pain on the inside w/ a white head. Some drainage and itching.   Symptoms started yesterday.   Using a warm compress.

## 2024-04-15 NOTE — ED Provider Notes (Signed)
 CAY RALPH PELT    CSN: [id number] Arrival date & time: 04/15/24  1105      History   Chief Complaint Chief Complaint  Patient presents with   Eye Problem    HPI Oreatha Fabry is a 48 y.o. female.  Patient presents with a small tender pustule in her left lower eyelid with localized swelling and redness.  She reports some white drainage from the pustule.  No eye trauma, change in vision, pain within the structure of her eye, foreign body sensation, fever.  Treatment at home with warm compresses.  Patient wears glasses and has had an eye exam within the last year.  The history is provided by the patient and medical records.    Past Medical History:  Diagnosis Date   Anemia    Anemia 04/10/2019   Anxiety    Bilateral polycystic ovarian syndrome 10/20/2012   Diabetes mellitus without complication (HCC)    Family history of adverse reaction to anesthesia    malignant hyperthermia in 2 first cousins    Family history of malignant hyperthermia    GERD (gastroesophageal reflux disease)    Hypothyroidism    Microcytosis 10/04/2019   Obesity affecting pregnancy    PCOS (polycystic ovarian syndrome)    Portal vein thrombosis    Postpartum care following cesarean delivery 12/19/2014   Preeclampsia in postpartum period    Pregnancy    Previous cesarean delivery, delivered 12/16/2014   Previous cesarean section    Prothrombin gene mutation    S/P cesarean section 12/17/2014   Scalp psoriasis    Sleep apnea     Patient Active Problem List   Diagnosis Date Noted   Acute pancreatitis 03/22/2024   Finger pain, left 04/30/2023   Fatty liver disease, nonalcoholic 12/24/2020   Atherosclerosis 12/24/2020   OSA on CPAP 09/23/2020   Type 2 diabetes mellitus without complication, without long-term current use of insulin  (HCC) 09/23/2020   Lynch syndrome 06/12/2020   Allergy with anaphylaxis due to food 06/12/2020   Metabolic syndrome 06/12/2020   Iron deficiency anemia  12/14/2019   Hypothyroidism 10/04/2019   Seborrheic dermatitis of scalp 10/04/2019   Class 2 severe obesity with serious comorbidity and body mass index (BMI) of 39.0 to 39.9 in adult 10/04/2019   Prothrombin gene mutation 10/04/2019   S/P total hysterectomy and bilateral salpingo-oophorectomy 06/26/2019   Hyperlipidemia 04/10/2019   Major depressive disorder with current active episode 04/10/2019   Allergic rhinitis 04/10/2019   PCOS (polycystic ovarian syndrome) 10/20/2012    Past Surgical History:  Procedure Laterality Date   CESAREAN SECTION     CESAREAN SECTION N/A 12/17/2014   Procedure: CESAREAN SECTION;  Surgeon: Garnette JONETTA Mace, MD;  Location: [facility];  Service: Obstetrics;  Laterality: N/A;   CYSTOSCOPY  06/26/2019   Procedure: CYSTOSCOPY;  Surgeon: Ward, Mitzie BROCKS, MD;  Location: [facility];  Service: Gynecology;;   CYSTOSCOPY WITH STENT PLACEMENT Right 06/26/2019   Procedure: CYSTOSCOPY WITH STENT PLACEMENT, Cystotomy;  Surgeon: Ward, Mitzie BROCKS, MD;  Location: [facility];  Service: Gynecology;  Laterality: Right;   DIAGNOSTIC LAPAROSCOPY     DILATION AND CURETTAGE OF UTERUS     LAPAROSCOPIC CHOLECYSTECTOMY     Lynch syndrome     ROBOTIC ASSISTED TOTAL HYSTERECTOMY WITH BILATERAL SALPINGO OOPHERECTOMY Bilateral 06/26/2019   Procedure: XI ROBOTIC ASSISTED TOTAL HYSTERECTOMY WITH BILATERAL SALPINGO OOPHORECTOMY;  Surgeon: Ward, Mitzie BROCKS, MD;  Location: [facility];  Service: Gynecology;  Laterality: Bilateral;    OB History  Gravida  5   Para  2   Term  2   Preterm  0   AB  3   Living  2      SAB  3   IAB  0   Ectopic  0   Multiple  0   Live Births  2            Home Medications    Prior to Admission medications   Medication Sig Start Date End Date Taking? Authorizing Provider  trimethoprim-polymyxin b (POLYTRIM) ophthalmic solution Place 1 drop into the left eye 4 (four) times daily for 7 days. 04/15/24 04/22/24 Yes Corlis Burnard DEL, NP  aspirin   EC 81 MG tablet Take 81 mg by mouth daily.    [provider]  atorvastatin  (LIPITOR) 20 MG tablet TAKE 1 TABLET BY MOUTH EVERYDAY AT BEDTIME 11/18/23   Tapia, Leisa, PA-C  cetirizine  (ZYRTEC ) 10 MG tablet Take 1 tablet (10 mg total) by mouth daily as needed for allergies. 04/27/22   Tapia, Leisa, PA-C  Continuous Glucose Sensor (DEXCOM G7 SENSOR) MISC Inject 1 patch into the skin every 14 (fourteen) days. 03/15/24   [provider]  CONTOUR NEXT TEST test strip 3 (three) times daily. 06/25/22   [provider]  EPINEPHrine  0.3 mg/0.3 mL IJ SOAJ injection Inject 0.3 mg into the muscle as needed for anaphylaxis. 06/22/22   Tapia, Leisa, PA-C  Estradiol  10 MCG TABS vaginal tablet Place 1 tablet (10 mcg total) vaginally 2 (two) times a week. 10/13/23   Tapia, Leisa, PA-C  hydrocortisone 2.5 % cream Apply 1 Application topically as needed. 08/23/22   [provider]  ketoconazole  (NIZORAL ) 2 % shampoo APPLY 1 APPLICATION TOPICALLY 2 (TWO) TIMES A WEEK. 04/24/21   Tapia, Leisa, PA-C  nystatin (MYCOSTATIN/NYSTOP) powder Apply 1 Application topically daily. 12/14/23   [provider]  pantoprazole  (PROTONIX ) 40 MG tablet TAKE 1 TABLET BY MOUTH EVERY DAY IN THE MORNING 11/18/23   Tapia, Leisa, PA-C  vortioxetine  HBr (TRINTELLIX ) 20 MG TABS tablet Take 1 tablet (20 mg total) by mouth daily. 11/18/23   Leavy Mole, PA-C    Family History Family History  Problem Relation Age of Onset   Hypertension Mother    Depression Mother    COPD Mother    Ovarian cancer Sister    Deep vein thrombosis Maternal Grandmother    Stroke Maternal Grandfather    Heart attack Maternal Grandfather    Malignant hyperthermia Cousin    Encopresis Son    Malignant hyperthermia Other    Breast cancer Neg Hx     Social History Social History   Tobacco Use   Smoking status: Former    Current packs/day: 1.00    Average packs/day: 1 pack/day for 5.0 years (5.0 ttl pk-yrs)    Types:  Cigarettes   Smokeless tobacco: Never   Tobacco comments:    pt stopped at age 21  Vaping Use   Vaping status: Never Used  Substance Use Topics   Alcohol use: Yes    Comment: 2 times a year for special occasions   Drug use: No     Allergies   Peach [prunus persica], Metformin  and related, Betula alba oil, and Vitex   Review of Systems Review of Systems  Constitutional:  Negative for chills and fever.  Eyes:  Positive for discharge, redness and itching. Negative for pain and visual disturbance.     Physical Exam Triage Vital Signs ED Triage Vitals  Encounter  Vitals Group     BP 04/15/24 1135 127/81     Girls Systolic BP Percentile --      Girls Diastolic BP Percentile --      Boys Systolic BP Percentile --      Boys Diastolic BP Percentile --      Pulse Rate 04/15/24 1135 71     Resp 04/15/24 1135 17     Temp 04/15/24 1135 98 F (36.7 C)     Temp src --      SpO2 04/15/24 1135 98 %     Weight --      Height --      Head Circumference --      Peak Flow --      Pain Score 04/15/24 1134 2     Pain Loc --      Pain Education --      Exclude from Growth Chart --    No data found.  Updated Vital Signs BP 127/81   Pulse 71   Temp 98 F (36.7 C)   Resp 17   LMP 05/23/2019 (Approximate)   SpO2 98%   Visual Acuity Right Eye Distance: 20/30 Left Eye Distance: 20/50 Bilateral Distance: 20/25  Right Eye Near:   Left Eye Near:    Bilateral Near:     Physical Exam Constitutional:      General: She is not in acute distress. HENT:     Mouth/Throat:     Mouth: Mucous membranes are moist.  Eyes:     General: Vision grossly intact.        Right eye: No discharge.        Left eye: No discharge.     Extraocular Movements: Extraocular movements intact.     Conjunctiva/sclera: Conjunctivae normal.     Pupils: Pupils are equal, round, and reactive to light.     Comments: Small pustule on inner left lower eyelid. Mild edema and faint erythema of left lower  eyelid.   Cardiovascular:     Rate and Rhythm: Normal rate and regular rhythm.     Heart sounds: Normal heart sounds.  Pulmonary:     Effort: Pulmonary effort is normal. No respiratory distress.     Breath sounds: Normal breath sounds.  Neurological:     Mental Status: She is alert.      UC Treatments / Results  Labs (all labs ordered are listed, but only abnormal results are displayed) Labs Reviewed - No data to display  EKG   Radiology No results found.  Procedures Procedures (including critical care time)  Medications Ordered in UC Medications - No data to display  Initial Impression / Assessment and Plan / UC Course  I have reviewed the triage vital signs and the nursing notes.  Pertinent labs & imaging results that were available during my care of the patient were reviewed by me and considered in my medical decision making (see chart for details).    Hordeolum of left lower eyelid.  Afebrile and vital signs are stable.  No eye trauma.  Treating today with Polytrim eyedrops and warm compresses.  Education provided on stye.  Instructed patient to follow-up with her eye care provider if she is not improving.  ED precautions given.  She agrees to plan of care.  Final Clinical Impressions(s) / UC Diagnoses   Final diagnoses:  Hordeolum externum of left lower eyelid     Discharge Instructions      Use the antibiotic eyedrops as prescribed.  Follow-up with your eye care provider if your symptoms are not improving.    Go to the emergency department if you have acute eye pain, changes in your vision, or other concerning symptoms.        ED Prescriptions     Medication Sig Dispense Auth. Provider   trimethoprim-polymyxin b (POLYTRIM) ophthalmic solution Place 1 drop into the left eye 4 (four) times daily for 7 days. 10 mL Corlis Burnard DEL, NP      PDMP not reviewed this encounter.   Corlis Burnard DEL, NP 04/15/24 1159

## 2024-04-15 NOTE — Discharge Instructions (Addendum)
 Use the antibiotic eyedrops as prescribed.    Follow-up with your eye care provider if your symptoms are not improving.    Go to the emergency department if you have acute eye pain, changes in your vision, or other concerning symptoms.

## 2024-04-17 ENCOUNTER — Other Ambulatory Visit: Payer: Self-pay | Admitting: Family Medicine

## 2024-04-17 DIAGNOSIS — Z1231 Encounter for screening mammogram for malignant neoplasm of breast: Secondary | ICD-10-CM

## 2024-04-17 NOTE — Telephone Encounter (Signed)
 Requested medications are due for refill today.  Unsure  Requested medications are on the active medications list.  yes  Last refill. 03/15/2024  Future visit scheduled.   no  Notes to clinic.  Historical    Requested Prescriptions  Pending Prescriptions Disp Refills   Continuous Glucose Sensor (DEXCOM G7 SENSOR) MISC [Pharmacy Med Name: DEXCOM G7 SENSOR] 3 each     Sig: 1 DEVICE BY DOES NOT APPLY ROUTE CONTINUOUS FOR 10 DAYS. APPLY DEVICE AND WEAR FOR 10 DAYS CONTINUOUSLY FOR GLUCOSE MONITORING     Endocrinology: Diabetes - Testing Supplies Passed - 04/17/2024  2:11 PM      Passed - Valid encounter within last 12 months    Recent Outpatient Visits           1 week ago Hypokalemia   [facility] Glenard Mire, MD   3 weeks ago Encounter for examination following treatment at hospital   [facility] Leavy Mole, PA-C   5 months ago Type 2 diabetes mellitus without complication, without long-term current use of insulin  [facility])   [facility] Leavy Mole, PA-C   6 months ago Otalgia, left   [facility] Leavy Mole, PA-C

## 2024-05-11 ENCOUNTER — Ambulatory Visit
Admission: RE | Admit: 2024-05-11 | Discharge: 2024-05-11 | Disposition: A | Discharge status: Home / Self Care | Source: Ambulatory Visit | Attending: Family Medicine | Admitting: Family Medicine

## 2024-05-11 DIAGNOSIS — Z1231 Encounter for screening mammogram for malignant neoplasm of breast: Secondary | ICD-10-CM | POA: Diagnosis present

## 2024-06-14 ENCOUNTER — Other Ambulatory Visit: Payer: Self-pay | Admitting: Family Medicine

## 2024-06-14 DIAGNOSIS — E119 Type 2 diabetes mellitus without complications: Secondary | ICD-10-CM

## 2024-06-16 NOTE — Telephone Encounter (Signed)
 Requested Prescriptions  Pending Prescriptions Disp Refills   Continuous Glucose Sensor (DEXCOM G7 SENSOR) MISC [Pharmacy Med Name: DEXCOM G7 SENSOR] 3 each 1    Sig: APPLY DEVICE AND WEAR FOR 10 DAYS CONTINUOUSLY FOR GLUCOSE MONITORING     Endocrinology: Diabetes - Testing Supplies Passed - 06/16/2024  2:34 PM      Passed - Valid encounter within last 12 months    Recent Outpatient Visits           2 months ago Hypokalemia   [facility] Glenard Mire, MD   2 months ago Encounter for examination following treatment at hospital   [facility] Leavy Mole, PA-C   7 months ago Type 2 diabetes mellitus without complication, without long-term current use of insulin  [facility])   [facility] Leavy Mole, PA-C   8 months ago Otalgia, left   [facility] Leavy Mole, PA-C

## 2024-06-26 ENCOUNTER — Other Ambulatory Visit: Payer: Self-pay | Admitting: Internal Medicine

## 2024-06-26 DIAGNOSIS — Z8719 Personal history of other diseases of the digestive system: Secondary | ICD-10-CM

## 2024-06-28 ENCOUNTER — Ambulatory Visit
Admission: RE | Admit: 2024-06-28 | Discharge: 2024-06-28 | Disposition: A | Source: Ambulatory Visit | Attending: Internal Medicine | Admitting: Internal Medicine

## 2024-06-28 DIAGNOSIS — Z8719 Personal history of other diseases of the digestive system: Secondary | ICD-10-CM | POA: Diagnosis present

## 2024-06-28 MED ORDER — IOHEXOL 300 MG/ML  SOLN
100.0000 mL | Freq: Once | INTRAMUSCULAR | Status: AC | PRN
  Administered 2024-06-28: 13:00:00 100 mL via INTRAVENOUS

## 2024-06-29 LAB — POCT I-STAT CREATININE: Creatinine, Ser: 1.1 mg/dL — ABNORMAL HIGH (ref 0.44–1.00)
